# Patient Record
Sex: Male | Born: 1940 | Race: White | Hispanic: No | Marital: Married | Smoking: Current every day smoker
Health system: Southern US, Community
[De-identification: ages and names within clinical notes are randomized; demographics above are authoritative.]

## PROBLEM LIST (undated history)

## (undated) DIAGNOSIS — E119 Type 2 diabetes mellitus without complications: Secondary | ICD-10-CM

## (undated) DIAGNOSIS — N189 Chronic kidney disease, unspecified: Secondary | ICD-10-CM

## (undated) DIAGNOSIS — E785 Hyperlipidemia, unspecified: Secondary | ICD-10-CM

## (undated) DIAGNOSIS — R0600 Dyspnea, unspecified: Secondary | ICD-10-CM

## (undated) DIAGNOSIS — I10 Essential (primary) hypertension: Secondary | ICD-10-CM

## (undated) DIAGNOSIS — Z923 Personal history of irradiation: Secondary | ICD-10-CM

## (undated) DIAGNOSIS — D509 Iron deficiency anemia, unspecified: Secondary | ICD-10-CM

## (undated) DIAGNOSIS — K5791 Diverticulosis of intestine, part unspecified, without perforation or abscess with bleeding: Secondary | ICD-10-CM

## (undated) DIAGNOSIS — Z87442 Personal history of urinary calculi: Secondary | ICD-10-CM

## (undated) DIAGNOSIS — C439 Malignant melanoma of skin, unspecified: Secondary | ICD-10-CM

## (undated) DIAGNOSIS — J42 Unspecified chronic bronchitis: Secondary | ICD-10-CM

## (undated) DIAGNOSIS — K5731 Diverticulosis of large intestine without perforation or abscess with bleeding: Secondary | ICD-10-CM

## (undated) DIAGNOSIS — I2510 Atherosclerotic heart disease of native coronary artery without angina pectoris: Secondary | ICD-10-CM

## (undated) HISTORY — PX: LITHOTRIPSY: SUR834

## (undated) HISTORY — PX: EYE SURGERY: SHX253

## (undated) HISTORY — PX: SKIN CANCER EXCISION: SHX779

## (undated) HISTORY — DX: Diverticulosis of intestine, part unspecified, without perforation or abscess with bleeding: K57.91

## (undated) HISTORY — DX: Iron deficiency anemia, unspecified: D50.9

## (undated) HISTORY — DX: Diverticulosis of large intestine without perforation or abscess with bleeding: K57.31

## (undated) HISTORY — DX: Personal history of irradiation: Z92.3

---

## 2011-12-25 ENCOUNTER — Telehealth: Payer: Self-pay | Admitting: Hematology & Oncology

## 2011-12-25 NOTE — Telephone Encounter (Signed)
Pt aware of 01-14-12 appointment 

## 2012-01-11 ENCOUNTER — Other Ambulatory Visit: Payer: Self-pay | Admitting: Medical

## 2012-01-11 DIAGNOSIS — D45 Polycythemia vera: Secondary | ICD-10-CM

## 2012-01-14 ENCOUNTER — Ambulatory Visit: Payer: Self-pay | Admitting: Hematology & Oncology

## 2012-01-14 ENCOUNTER — Other Ambulatory Visit: Payer: Medicare Other | Admitting: Lab

## 2012-01-14 ENCOUNTER — Ambulatory Visit: Payer: Medicare Other | Admitting: Medical

## 2012-01-14 ENCOUNTER — Ambulatory Visit (HOSPITAL_BASED_OUTPATIENT_CLINIC_OR_DEPARTMENT_OTHER): Payer: Medicare Other | Admitting: Hematology & Oncology

## 2012-01-14 ENCOUNTER — Ambulatory Visit: Payer: Medicare Other

## 2012-01-14 VITALS — BP 139/87 | HR 91 | Temp 96.9°F | Ht 66.0 in | Wt 175.0 lb

## 2012-01-14 DIAGNOSIS — D751 Secondary polycythemia: Secondary | ICD-10-CM

## 2012-01-14 DIAGNOSIS — D45 Polycythemia vera: Secondary | ICD-10-CM

## 2012-01-14 LAB — CBC WITH DIFFERENTIAL (CANCER CENTER ONLY)
BASO#: 0 10*3/uL (ref 0.0–0.2)
Eosinophils Absolute: 0.2 10*3/uL (ref 0.0–0.5)
HGB: 18.2 g/dL — ABNORMAL HIGH (ref 13.0–17.1)
LYMPH#: 3.1 10*3/uL (ref 0.9–3.3)
MONO#: 0.5 10*3/uL (ref 0.1–0.9)
NEUT#: 6 10*3/uL (ref 1.5–6.5)
RBC: 5.75 10*6/uL — ABNORMAL HIGH (ref 4.20–5.70)
WBC: 9.9 10*3/uL (ref 4.0–10.0)

## 2012-01-14 NOTE — Progress Notes (Signed)
This office note has been dictated.

## 2012-01-14 NOTE — Progress Notes (Signed)
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  HISTORY OF PRESENT ILLNESS:  Mr. Reuter is a nice 71 year old gentleman. He is followed by Dr. Martha Clan.  He has a history of diabetes.  He has a history of COPD.  He still smokes 1 pack a day.  He has had blood work done.  He was found back on 12/12/2011 to have a white cell count of 9.8, hemoglobin 18.4, hematocrit 55, platelet count 224.  MCV is 94.  He had electrolytes done, which all looked okay.  His BUN and creatinine were 20 and 1.1.  He had normal TSH.  He is on Plavix.  He may have had a TIA in the past.  He did have a melanoma removed back in December of 2012.  He apparently had this localized.  It did not require any sentinel node biopsy.  Because of the elevated red cell count, he was referred to the [facility] for evaluation.  Dr. Martha Clan felt that polycythemia was a possibility.  Mr. Kobus does not state any type of pruritus after taking a shower.  He has had no kind of bony pain.  He has had no nausea or vomiting.  He denies any kind of dyspnea with exertion.  There has been no cough.  He does have a history of I think kidney stones.  PAST MEDICAL HISTORY: 1. Diabetes. 2. Hypertension. 3. 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 4. TIA. 5. COPD (the patient has never been aware of this). 6. Mild renal insufficiency.  ALLERGIES:  Sulfa drugs.  OUTPATIENT MEDICATIONS: 1. Plavix 75 mg daily. 2. Hydrochlorothiazide 12.5 mg p.o. daily. 3. Pravachol 40 mg p.o. daily. 4. Altace 10 mg p.o. daily. 5. Folic acid 400 mcg p.o. daily.  SOCIAL HISTORY:  Remarkable for probably about a 100 pack year history of tobacco use.  He smokes 1 pack a day now and is trying to cut back. He has no significant alcohol use.  He did have Agent Orange exposure back in [place].  He was in [employer].  He says that he saw [place] the [place].  FAMILY HISTORY:  Remarkable for "brain  cancer", coronary artery disease, nephrolithiasis.  REVIEW OF SYSTEMS:  As stated in history of present illness.  No additional findings noted on 12 system review.  PHYSICAL EXAMINATION:  General:  This is a well-developed, well- nourished white gentleman in no obvious distress.  He does have some facial plethora.  Vital signs:  His vital signs show a temperature of 96.8, pulse 91, respiratory rate 20, blood pressure 139/87.  Weight is 175.  Head and neck:  Normocephalic, atraumatic skull.  There are no ocular or oral lesions.  He has no conjunctival inflammation.  Pupils react appropriately.  He has no intraoral lesions.  No adenopathy is noted in the neck.  Thyroid is not palpable.  Lungs:  Clear to percussion and auscultation bilaterally.  He has no rales, wheezes or rhonchi.  Cardiac:  Regular rate and rhythm with a normal S1 and S2. There are no murmurs, rubs or bruits.  Abdomen:  Soft with good bowel sounds.  He has no fluid wave.  There is no guarding or rebound tenderness.  There is no palpable hepatosplenomegaly.  Back:  Shows no tenderness over the spine, ribs or hips.  Extremities:  Show no clubbing, cyanosis or edema.  Skin:  Exam does show some degree of ruddy complexion.  Neurological:  Shows  no focal neurological findings.  LABORATORY STUDIES:  White cell count 9.9, hemoglobin 18.2, hematocrit 32, platelet count 242.  MCV is 90.  Peripheral smear shows a normochromic, normocytic population of red blood cells.  He has no immature red cells.  There are no nucleated red cells.  I see no rouleaux formation.  He has no teardrop cells.  I see no schistocytes. White cells appear normal in morphology and maturation.  I see no hypersegmented polys.  I see no immature myeloid or lymphoid forms. Platelets are adequate in number and size.  LDH is 112.  Oxygen saturation on room air is 98%.  IMPRESSION:  Mr. Collington is a 71 year old gentleman with erythrocytosis.  I think it is  debatable whether he does have polycythemia.  I must say that he does have quite a reddish complexion to his skin.  We are going to have to await the results of his JAK2 assay, erythropoietin assay and his iron studies.  I think we also sent off a B12 level on him.  Given that he does have high blood pressure and he is somewhat overweight, he may have [place]'s polycythemia or spurious polycythemia.  I checked out his oxygen saturation.  He is really not hypoxic.  I cannot feel an enlarged spleen on him.  I do not suspect that we have to do a bone marrow test on him right now.  Again, we will have to await the results of his lab work and then plan accordingly.  The fact that he had this TIA certainly is a little troublesome.  This could be an indicator of some hyperviscosity.  In fact, if that is the case then he certainly would benefit from phlebotomy to keep his hematocrit below 45%.  Once we get his lab results back, we will be in touch with Mr. Moulder then we will plan accordingly for a phlebotomy program if necessary.  I spent a good hour or so with him and his wife.  They are both very, very nice.  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  He certainly is a true [employer] hero.  The fact that he was in [place] with Agent Orange exposure does increase his risk for blood issues.    ______________________________ Josph Macho, M.D. PRE/MEDQ  D:  01/14/2012  T:  01/14/2012  Job:  [id number]

## 2012-01-15 ENCOUNTER — Other Ambulatory Visit: Payer: Self-pay | Admitting: Medical

## 2012-01-15 LAB — LACTATE DEHYDROGENASE: LDH: 112 U/L (ref 94–250)

## 2012-01-15 LAB — FERRITIN: Ferritin: 39 ng/mL (ref 22–322)

## 2012-01-15 LAB — ERYTHROPOIETIN: Erythropoietin: 11.9 m[IU]/mL (ref 2.6–34.0)

## 2012-01-25 ENCOUNTER — Other Ambulatory Visit: Payer: Self-pay

## 2012-02-07 ENCOUNTER — Other Ambulatory Visit: Payer: Self-pay | Admitting: Hematology & Oncology

## 2012-02-07 DIAGNOSIS — D45 Polycythemia vera: Secondary | ICD-10-CM

## 2012-02-07 DIAGNOSIS — D751 Secondary polycythemia: Secondary | ICD-10-CM

## 2012-02-13 ENCOUNTER — Ambulatory Visit (HOSPITAL_BASED_OUTPATIENT_CLINIC_OR_DEPARTMENT_OTHER): Payer: Medicare Other

## 2012-02-13 VITALS — BP 121/82 | HR 81 | Temp 96.9°F

## 2012-02-13 DIAGNOSIS — D45 Polycythemia vera: Secondary | ICD-10-CM

## 2012-02-13 NOTE — Patient Instructions (Signed)

## 2012-02-13 NOTE — Progress Notes (Signed)
Robert Tran presents today for phlebotomy per MD orders. Phlebotomy procedure started at 1415and ended at 1435. 500 ml removed. Patient observed for 30 minutes after procedure without any incident. Patient tolerated procedure well. IV needle removed intact.

## 2012-02-20 ENCOUNTER — Ambulatory Visit (HOSPITAL_BASED_OUTPATIENT_CLINIC_OR_DEPARTMENT_OTHER): Payer: Medicare Other

## 2012-02-20 DIAGNOSIS — D45 Polycythemia vera: Secondary | ICD-10-CM

## 2012-02-20 NOTE — Progress Notes (Signed)
Robert Tran presents today for phlebotomy per MD orders. Phlebotomy procedure started at 1440 and ended at 1448. Approximately removed. Patient observed for 30 minutes after procedure without any incident. Patient tolerated procedure well. IV needle removed intact. Teola Bradley, Yassmine Tamm [employer]

## 2012-02-27 ENCOUNTER — Ambulatory Visit (HOSPITAL_BASED_OUTPATIENT_CLINIC_OR_DEPARTMENT_OTHER): Payer: Medicare Other | Admitting: Hematology & Oncology

## 2012-02-27 ENCOUNTER — Ambulatory Visit (HOSPITAL_BASED_OUTPATIENT_CLINIC_OR_DEPARTMENT_OTHER): Payer: Medicare Other

## 2012-02-27 ENCOUNTER — Other Ambulatory Visit (HOSPITAL_BASED_OUTPATIENT_CLINIC_OR_DEPARTMENT_OTHER): Payer: Medicare Other | Admitting: Lab

## 2012-02-27 VITALS — BP 110/72 | HR 72 | Temp 97.0°F | Resp 20 | Ht 66.0 in | Wt 172.0 lb

## 2012-02-27 DIAGNOSIS — D45 Polycythemia vera: Secondary | ICD-10-CM

## 2012-02-27 DIAGNOSIS — D751 Secondary polycythemia: Secondary | ICD-10-CM

## 2012-02-27 DIAGNOSIS — F17200 Nicotine dependence, unspecified, uncomplicated: Secondary | ICD-10-CM

## 2012-02-27 DIAGNOSIS — J449 Chronic obstructive pulmonary disease, unspecified: Secondary | ICD-10-CM

## 2012-02-27 DIAGNOSIS — E119 Type 2 diabetes mellitus without complications: Secondary | ICD-10-CM

## 2012-02-27 LAB — CBC WITH DIFFERENTIAL (CANCER CENTER ONLY)
BASO#: 0.1 10*3/uL (ref 0.0–0.2)
EOS%: 2 % (ref 0.0–7.0)
Eosinophils Absolute: 0.2 10*3/uL (ref 0.0–0.5)
HGB: 16.3 g/dL (ref 13.0–17.1)
LYMPH%: 19.1 % (ref 14.0–48.0)
MCH: 32.1 pg (ref 28.0–33.4)
MCHC: 34.9 g/dL (ref 32.0–35.9)
MCV: 92 fL (ref 82–98)
MONO%: 4.4 % (ref 0.0–13.0)
RBC: 5.07 10*6/uL (ref 4.20–5.70)

## 2012-02-27 NOTE — Progress Notes (Signed)
Robert Tran presents today for phlebotomy per MD orders. Phlebotomy procedure started at 1130 and ended at 1145.  500cc removed. Patient tolerated procedure well. IV needle removed intact.

## 2012-02-27 NOTE — Progress Notes (Signed)
This office note has been dictated.

## 2012-02-27 NOTE — Patient Instructions (Signed)
Therapeutic Phlebotomy Therapeutic phlebotomy is the controlled removal of blood from your body for the purpose of treating a medical condition. It is similar to donating blood. Usually, about a pint (470 mL) of blood is removed. The average adult has 9 to 12 pints (4.3 to 5.7 L) of blood. Therapeutic phlebotomy may be used to treat the following medical conditions:  Hemochromatosis. This is a condition in which there is too much iron in the blood.   Polycythemia vera. This is a condition in which there are too many red cells in the blood.   Porphyria cutanea tarda. This is a disease usually passed from one generation to the next (inherited). It is a condition in which an important part of hemoglobin is not made properly. This results in the build up of abnormal amounts of porphyrins in the body.   Sickle cell disease. This is an inherited disease. It is a condition in which the red blood cells form an abnormal crescent shape rather than a round shape.  LET YOUR CAREGIVER KNOW ABOUT:  Allergies.   Medicines taken including herbs, eyedrops, over-the-counter medicines, and creams.   Use of steroids (by mouth or creams).   Previous problems with anesthetics or numbing medicine.   History of blood clots.   History of bleeding or blood problems.   Previous surgery.   Possibility of pregnancy, if this applies.  RISKS AND COMPLICATIONS This is a simple and safe procedure. Problems are unlikely. However, problems can occur and may include:  Nausea or lightheadedness.   Low blood pressure.   Soreness, bleeding, swelling, or bruising at the needle insertion site.   Infection.  BEFORE THE PROCEDURE  This is a procedure that can be done as an outpatient. Confirm the time that you need to arrive for your procedure. Confirm whether there is a need to fast or withhold any medications. It is helpful to wear clothing with sleeves that can be raised above the elbow. A blood sample may be done  to determine the amount of red blood cells or iron in your blood. Plan ahead of time to have someone drive you home after the procedure. PROCEDURE The entire procedure from preparation through recovery takes about 1 hour. The actual collection takes about 10 to 15 minutes.  A needle will be inserted into your vein.   Tubing and a collection bag will be attached to that needle.   Blood will flow through the needle and tubing into the collection bag.   You may be asked to open and close your hand slowly and continuously during the entire collection.   Once the specified amount of blood has been removed from your body, the collection bag and tubing will be clamped.   The needle will be removed.   Pressure will be held on the site of the needle insertion to stop the bleeding. Then a bandage will be placed over the needle insertion site.  AFTER THE PROCEDURE  Your recovery will be assessed and monitored. If there are no problems, as an outpatient, you should be able to go home shortly after the procedure.  Document Released: 12/04/2010 Document Revised: 06/21/2011 Document Reviewed: 12/04/2010 [employer] Patient Information 2012 [employer]. 

## 2012-02-28 NOTE — Progress Notes (Signed)
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  CURRENT THERAPY:  Phlebotomy to maintain hematocrit below 45%.  INTERIM HISTORY:  Robert Tran comes in for his followup.  He is feeling better.  We did do a fairly extensive evaluation on him when we initially saw him.  When we first saw him back in early July, his ferritin was 39.  His erythropoietin level was only 12.  LDH was normal. We did do a JAK2 assay on him.  JAK2 was negative.  However, I felt that he, in all likelihood, had polycythemia.  He does smoke, which does not help the issue.  He is on Plavix.  He had a TIA in the past.  We have him on a phlebotomy program.  He has doing well with phlebotomies.  He says he actually feels better.  He does have diabetes. There is a history of COPD.  PHYSICAL EXAMINATION:  This is a well-developed, well-nourished white gentleman in no obvious distress.  Vital signs:  97, pulse 72, respiratory rate 18, blood pressure 110/72.  Weight is 172.  Head and neck:  Normocephalic, atraumatic skull.  There are no ocular or oral lesions.  There are no palpable cervical or supraclavicular lymph nodes. He does have some facial plethora.  There is no adenopathy in his neck. Lungs:  Clear bilaterally.  Cardiac:  Regular rate and rhythm with a normal S1 and S2.  There are no murmurs, rubs or bruits.  Abdomen:  Soft with good bowel sounds.  There is no palpable abdominal mass.  There is no fluid wave.  There is no palpable hepatosplenomegaly.  Back:  No tenderness over the spine, ribs, or hips.  Extremities:  No clubbing, cyanosis or edema.  LABORATORY STUDIES:  White cell count is 11, hemoglobin 16.3, hematocrit 46.7, platelet count 240.  IMPRESSION:  Mr. Delay is a 71 year old gentleman with polycythemia.  I suppose that he still may have secondary polycythemia.  He does have the chronic obstructive pulmonary disease with tobacco use.  He is still smoking.  However, he is  doing better and feeling better clinically with phlebotomies.  As such, I believe that we should keep him phlebotomized and keep his hematocrit below 45.  We will phlebotomize him today.  We will then see about getting him back in 1 month and seeing how his hematocrit goes.  We will follow his iron levels and ferritin.  This will give me a good idea as to him becoming iron-deficient.    ______________________________ Josph Macho, M.D. PRE/MEDQ  D:  02/27/2012  T:  02/28/2012  Job:  [id number]

## 2012-03-28 ENCOUNTER — Other Ambulatory Visit (HOSPITAL_BASED_OUTPATIENT_CLINIC_OR_DEPARTMENT_OTHER): Payer: Medicare Other | Admitting: Lab

## 2012-03-28 ENCOUNTER — Ambulatory Visit (HOSPITAL_BASED_OUTPATIENT_CLINIC_OR_DEPARTMENT_OTHER): Payer: Medicare Other

## 2012-03-28 ENCOUNTER — Ambulatory Visit (HOSPITAL_BASED_OUTPATIENT_CLINIC_OR_DEPARTMENT_OTHER): Payer: Medicare Other | Admitting: Hematology & Oncology

## 2012-03-28 VITALS — BP 118/69 | HR 86 | Temp 98.4°F | Resp 20 | Ht 67.0 in | Wt 171.0 lb

## 2012-03-28 VITALS — BP 146/80 | HR 86

## 2012-03-28 DIAGNOSIS — J449 Chronic obstructive pulmonary disease, unspecified: Secondary | ICD-10-CM

## 2012-03-28 DIAGNOSIS — D45 Polycythemia vera: Secondary | ICD-10-CM | POA: Insufficient documentation

## 2012-03-28 DIAGNOSIS — E119 Type 2 diabetes mellitus without complications: Secondary | ICD-10-CM

## 2012-03-28 LAB — CBC WITH DIFFERENTIAL (CANCER CENTER ONLY)
BASO%: 0.5 % (ref 0.0–2.0)
EOS%: 2 % (ref 0.0–7.0)
LYMPH%: 24.2 % (ref 14.0–48.0)
MCH: 31.6 pg (ref 28.0–33.4)
MCHC: 34.2 g/dL (ref 32.0–35.9)
MCV: 92 fL (ref 82–98)
MONO%: 6.2 % (ref 0.0–13.0)
NEUT#: 6.2 10*3/uL (ref 1.5–6.5)
Platelets: 243 10*3/uL (ref 145–400)
RDW: 13.6 % (ref 11.1–15.7)

## 2012-03-28 LAB — IRON AND TIBC: %SAT: 25 % (ref 20–55)

## 2012-03-28 LAB — FERRITIN: Ferritin: 13 ng/mL — ABNORMAL LOW (ref 22–322)

## 2012-03-28 NOTE — Progress Notes (Signed)
CC:   Modesto Charon  DIAGNOSIS:  Polycythemia vera, JAK2 negative.  CURRENT THERAPY:  Phlebotomy to maintain hematocrit less than 45%.  INTERIM HISTORY:  Mr. Simoes comes in for his followup.  He is looking pretty good.  His face is quite red again today.  Apparently he and his wife are getting ready to go back to the beach.  They will be doing some painting on their house down there.  When we last checked his ferritin it was 39 back in July.  We will check into this again today.  He is phlebotomized pretty much once a month.  He is trying to cut back on smoking.  This I think will also help keep his blood down.  He has had no pruritus.  He has had no leg swelling.  He has had no change in bowel or bladder habits.  He has had no headache.  He did have some problems with one of his cholesterol medicines.  He said he could not move and was very stiff.  As such, he stopped this.  PHYSICAL EXAMINATION:  General:  This is a well-developed, well- nourished white gentleman in no obvious distress.  Vital signs:  98.4, pulse 86, respiratory rate 16, blood pressure 118/69.  Weight is 171. Head and neck:  Normocephalic, atraumatic skull.  There are no ocular or oral lesions.  There are no palpable cervical, supraclavicular lymph nodes.  Lungs:  Clear bilaterally.  Cardiac:  Regular rate and rhythm with normal S1 and S2.  There are no murmurs, rubs or bruits.  Abdomen: Soft with good bowel sounds.  There is no palpable abdominal mass.  No palpable hepatosplenomegaly.  Extremities:  Shows no clubbing, cyanosis or edema.  Neurological:  Shows no focal neurological deficits.  LABORATORY STUDIES:  White cell count is 9.2, hemoglobin 16.2, hematocrit 47.3, platelet count 243.  IMPRESSION:  Mr. Nuzum is a 71 year old gentleman with polycythemia. Some of erythrocytosis is from his smoking.  Again, I encouraged him to cut back on his tobacco use.  I also encouraged him to use an  electronic cigarette.  We will go ahead and phlebotomize him again today.  We will then plan to get him back in another month.  Again, I really want make sure that we keep his hematocrit below 45%.    ______________________________ Josph Macho, M.D. PRE/MEDQ  D:  03/28/2012  T:  03/28/2012  Job:  [id number]

## 2012-03-28 NOTE — Progress Notes (Signed)
This office note has been dictated.

## 2012-03-28 NOTE — Patient Instructions (Signed)
Call if problems 

## 2012-03-28 NOTE — Progress Notes (Signed)
Robert Tran presents today for phlebotomy per MD orders. Phlebotomy procedure started at 1000 and ended at 1015 removed. Approximately 500 mls removed. Patient observed for 30 minutes after procedure without any incident. Patient tolerated procedure well. IV needle removed intact. Teola Bradley, Karinda Cabriales [employer]

## 2012-03-28 NOTE — Patient Instructions (Signed)

## 2012-04-24 ENCOUNTER — Ambulatory Visit (HOSPITAL_BASED_OUTPATIENT_CLINIC_OR_DEPARTMENT_OTHER): Payer: Medicare Other | Admitting: Hematology & Oncology

## 2012-04-24 ENCOUNTER — Other Ambulatory Visit (HOSPITAL_BASED_OUTPATIENT_CLINIC_OR_DEPARTMENT_OTHER): Payer: Medicare Other | Admitting: Lab

## 2012-04-24 ENCOUNTER — Ambulatory Visit (HOSPITAL_BASED_OUTPATIENT_CLINIC_OR_DEPARTMENT_OTHER): Payer: Medicare Other

## 2012-04-24 VITALS — BP 130/83 | HR 92 | Resp 20

## 2012-04-24 VITALS — BP 123/67 | HR 86 | Temp 97.9°F | Resp 18 | Ht 67.0 in | Wt 170.0 lb

## 2012-04-24 DIAGNOSIS — D45 Polycythemia vera: Secondary | ICD-10-CM

## 2012-04-24 LAB — CBC WITH DIFFERENTIAL (CANCER CENTER ONLY)
BASO%: 0.7 % (ref 0.0–2.0)
EOS%: 3.2 % (ref 0.0–7.0)
HCT: 47.3 % (ref 38.7–49.9)
LYMPH#: 3.7 10*3/uL — ABNORMAL HIGH (ref 0.9–3.3)
MCHC: 34.2 g/dL (ref 32.0–35.9)
NEUT%: 52.9 % (ref 40.0–80.0)
Platelets: 276 10*3/uL (ref 145–400)
RDW: 13.1 % (ref 11.1–15.7)
WBC: 10.3 10*3/uL — ABNORMAL HIGH (ref 4.0–10.0)

## 2012-04-24 NOTE — Progress Notes (Signed)
This office note has been dictated.

## 2012-04-24 NOTE — Progress Notes (Signed)
Robert Tran presents today for phlebotomy per MD orders. Phlebotomy procedure started at 1435 and ended at 1455. 500 grams removed. Patient observed for 30 minutes after procedure without any incident. Patient tolerated procedure well. IV needle removed intact.

## 2012-04-24 NOTE — Patient Instructions (Signed)
Call if needed

## 2012-04-25 NOTE — Progress Notes (Signed)
CC:   Robert Organ, MD  DIAGNOSIS:  Polycythemia vera, JAK2 negative.  CURRENT THERAPY:  Phlebotomy to maintain hematocrit less than 45%.  INTERIM HISTORY:  Robert Tran comes in for followup.  He is doing okay.  He has cut back on his tobacco use.  He is also trying to cut back on his coffee consumption.  He was drinking 10 cups a day.  I told him that all that caffeine could easily cause his  blood to go up as caffeine is a vasoconstrictor.  Iron has not been a problem for him.  When we last checked his ferritin, it was down to 13.  He has had no headache.  He has had no chest pain.  He has had no problem with bowels or bladder.  He is on Plavix and doing well with this.  PHYSICAL EXAMINATION:  This is a well-developed, well-nourished white gentleman in no obvious distress.  Vital signs:  Temperature of 97.9, pulse 86, respiratory rate 18, blood pressure 123/67.  Weight is 170. Head and neck:  Normocephalic, atraumatic skull.  There are no ocular or oral lesions.  There are no palpable cervical or supraclavicular lymph nodes.  Lungs:  Clear bilaterally.  Cardiac:  Regular rate and rhythm with a normal S1, S2.  There are no murmurs, rubs or bruits.  Abdomen: Soft with good bowel sounds.  There is no palpable abdominal mass. There is no fluid wave.  There is no palpable hepatosplenomegaly.  Back: No tenderness over the spine, ribs, or hips.  Extremities:  No clubbing, cyanosis or edema.  Skin:  Shows some facial plethora.  He has a little bit of a "ruddy" complexion to his skin.  Neurological:  No focal neurological deficits.  LABORATORY STUDIES:  White cell count is 10.3, hemoglobin 16.2, hematocrit 47.3, platelet count 276.  IMPRESSION:  Robert Tran is a 71 year old gentleman with polycythemia vera. We have him on a phlebotomy program.  He is doing okay with this.  We will phlebotomize him today.  We will then plan to get him back in about, I think, 6 weeks.  I think we can go a  few extra weeks.  Again, he is doing his best to cut back on tobacco use and also caffeine consumption.   ______________________________ Josph Macho, M.D. PRE/MEDQ  D:  04/24/2012  T:  04/25/2012  Job:  [id number]

## 2012-04-27 LAB — IRON AND TIBC
Iron: 90 ug/dL (ref 42–165)
TIBC: 413 ug/dL (ref 215–435)

## 2012-06-19 ENCOUNTER — Other Ambulatory Visit (HOSPITAL_BASED_OUTPATIENT_CLINIC_OR_DEPARTMENT_OTHER): Payer: Medicare Other | Admitting: Lab

## 2012-06-19 ENCOUNTER — Ambulatory Visit (HOSPITAL_BASED_OUTPATIENT_CLINIC_OR_DEPARTMENT_OTHER): Payer: Medicare Other | Admitting: Hematology & Oncology

## 2012-06-19 ENCOUNTER — Ambulatory Visit (HOSPITAL_BASED_OUTPATIENT_CLINIC_OR_DEPARTMENT_OTHER): Payer: Medicare Other

## 2012-06-19 VITALS — BP 144/75 | HR 85 | Temp 97.9°F | Resp 18 | Ht 66.0 in | Wt 173.0 lb

## 2012-06-19 VITALS — BP 157/83 | HR 89

## 2012-06-19 DIAGNOSIS — D45 Polycythemia vera: Secondary | ICD-10-CM

## 2012-06-19 DIAGNOSIS — D509 Iron deficiency anemia, unspecified: Secondary | ICD-10-CM

## 2012-06-19 LAB — CBC WITH DIFFERENTIAL (CANCER CENTER ONLY)
BASO%: 0.6 % (ref 0.0–2.0)
EOS%: 3.3 % (ref 0.0–7.0)
LYMPH#: 4.1 10*3/uL — ABNORMAL HIGH (ref 0.9–3.3)
LYMPH%: 39.5 % (ref 14.0–48.0)
MCHC: 33.4 g/dL (ref 32.0–35.9)
MCV: 88 fL (ref 82–98)
MONO#: 0.9 10*3/uL (ref 0.1–0.9)
NEUT%: 47.9 % (ref 40.0–80.0)
Platelets: 259 10*3/uL (ref 145–400)
RDW: 13.9 % (ref 11.1–15.7)
WBC: 10.4 10*3/uL — ABNORMAL HIGH (ref 4.0–10.0)

## 2012-06-19 LAB — CHCC SATELLITE - SMEAR

## 2012-06-19 NOTE — Patient Instructions (Signed)

## 2012-06-19 NOTE — Progress Notes (Signed)
Robert Tran presents today for phlebotomy per MD orders. Phlebotomy procedure started at 1456 and ended at 1300. Approximately 500 mls removed. Patient observed for 30 minutes after procedure without any incident. Patient tolerated procedure well. IV needle removed intact. Teola Bradley, Brindle Leyba [employer]

## 2012-06-19 NOTE — Progress Notes (Signed)
This office note has been dictated.

## 2012-06-20 NOTE — Progress Notes (Signed)
CC:   Robert Tran  DIAGNOSIS:  Polycythemia vera, JAK2 negative.  CURRENT THERAPY: 1. Phlebotomy to maintain hematocrit less than 45%. 2. Plavix 75 mg p.o. daily.  INTERIM HISTORY:  Robert Tran comes in for his followup.  He is doing well. He is still drinking quite a bit of coffee.  He is still smoking but cutting back on this also.  He and his wife have spent a lot of time down at the beach.  They have a house down there.  His wife however now is working for [employer]  Robert Tran had last ferritin in October of 8.  He was last phlebotomized in October.  He has had no headache.  There is no burning in hands or feet.  He has had no change in bowel or bladder habits.  PHYSICAL EXAMINATION:  General:  This is a well-developed, well- nourished white gentleman in no obvious distress.  Vital signs:  97.9, pulse 85, respiratory rate 18, blood pressure 144/75.  Weight is 173. Head and neck:  Shows a normocephalic, atraumatic skull.  There are no ocular or oral lesions.  There are no palpable cervical or supraclavicular lymph nodes.  Lungs:  Clear bilaterally.  There are no rales, wheeze or rhonchi.  Cardiac:  Regular rate and rhythm with normal S1, S2.  There are no murmurs, rubs or bruits.  Abdomen:  Soft with good bowel sounds.  There is no palpable abdominal mass.  There is no fluid wave.  There is no palpable hepatosplenomegaly.  Extremities:  Show no clubbing, cyanosis or edema.  Neurological:  Shows no focal neurological deficits.  LABORATORY STUDIES:  White cell count is 10.4, hemoglobin 15, hematocrit 44.9, platelet count 259.  MCV is 88.  IMPRESSION:  Robert Tran is a 71 year old gentleman with polycythemia vera. We will phlebotomize him today.  He is right at 45.  I feel that phlebotomizing him will get him through the holidays and I will feel better making sure that his blood is kept below 45%.  We will plan to get him back in 2 more months.  He clearly is iron  deficient so hopefully will continue to get iron out of him to decrease his bone marrow's hematopoietic abilities.    ______________________________ Josph Macho, M.D. PRE/MEDQ  D:  06/19/2012  T:  06/20/2012  Job:  [id number]

## 2012-08-20 ENCOUNTER — Ambulatory Visit: Payer: Medicare Other

## 2012-08-20 ENCOUNTER — Other Ambulatory Visit (HOSPITAL_BASED_OUTPATIENT_CLINIC_OR_DEPARTMENT_OTHER): Payer: Medicare Other | Admitting: Lab

## 2012-08-20 ENCOUNTER — Ambulatory Visit (HOSPITAL_BASED_OUTPATIENT_CLINIC_OR_DEPARTMENT_OTHER): Payer: Medicare Other | Admitting: Hematology & Oncology

## 2012-08-20 VITALS — BP 122/63 | HR 84 | Temp 98.6°F | Resp 18 | Ht 66.0 in | Wt 173.0 lb

## 2012-08-20 DIAGNOSIS — D45 Polycythemia vera: Secondary | ICD-10-CM

## 2012-08-20 DIAGNOSIS — F17200 Nicotine dependence, unspecified, uncomplicated: Secondary | ICD-10-CM

## 2012-08-20 LAB — IRON AND TIBC
TIBC: 397 ug/dL (ref 215–435)
UIBC: 333 ug/dL (ref 125–400)

## 2012-08-20 LAB — CBC WITH DIFFERENTIAL (CANCER CENTER ONLY)
BASO%: 0.8 % (ref 0.0–2.0)
EOS%: 3.4 % (ref 0.0–7.0)
LYMPH#: 3 10*3/uL (ref 0.9–3.3)
MCHC: 32.8 g/dL (ref 32.0–35.9)
MONO#: 0.7 10*3/uL (ref 0.1–0.9)
Platelets: 265 10*3/uL (ref 145–400)
RDW: 15.6 % (ref 11.1–15.7)
WBC: 9.1 10*3/uL (ref 4.0–10.0)

## 2012-08-20 LAB — FERRITIN: Ferritin: 8 ng/mL — ABNORMAL LOW (ref 22–322)

## 2012-08-20 NOTE — Progress Notes (Signed)
No phlebotomy today per dr. ennever 

## 2012-08-20 NOTE — Progress Notes (Signed)
This office note has been dictated.

## 2012-08-21 NOTE — Progress Notes (Signed)
CC:   Modesto Charon  DIAGNOSIS:  Polycythemia vera-JAK2 negative.  CURRENT THERAPY: 1. Phlebotomy to maintain hematocrit less than 45%. 2. Plavix 75 mg p.o. daily.  INTERIM HISTORY:  Mr. Rothe comes in for his followup.  We last saw him back in early December.  He has been doing well.  He says he is down to smoking 1 pack a day now.  He is trying to cut back on his coffee use also.  I told him that the less that he smokes, the better that his blood counts will be and the less he will need to be phlebotomized.  His last ferritin back in December was down to 6.  He has had no bleeding.  He has had no cough.  He has had no headache. There has been no TIA-like symptoms.  He has had no leg swelling.  PHYSICAL EXAMINATION:  General:  This is a well-developed, well- nourished white gentleman in no obvious distress.  Vital signs: Temperature 98.6, pulse 84, respiratory rate 18, blood pressure 122/63. Weight is 173.  Head and neck:  Normocephalic, atraumatic skull.  There are no ocular or oral lesions.  There are no palpable cervical or supraclavicular lymph nodes.  Lungs:  Clear bilaterally.  Cardiac: Regular rate and rhythm with a normal S1 and S2.  There are no murmurs, rubs, or bruits.  Abdomen:  Soft with good bowel sounds.  There is no palpable abdominal mass.  There is no fluid wave.  There is no palpable hepatosplenomegaly.  Back:  No tenderness over the spine, ribs, or hips. Extremities:  No clubbing, cyanosis, or edema.  Neurological:  No focal neurological deficits.  LABORATORY STUDIES:  White cell count is 9.1, hemoglobin 14.6, hematocrit 44.5, platelet count 265.  IMPRESSION:  Mr. Pardo is a 72 year old gentleman with polycythemia vera. Again, he is doing quite well.  We will not need to phlebotomize him right now.  The less he smokes, the better his blood counts will be.  We will plan to get him back in another 2 months for  followup.   ______________________________ Josph Macho, M.D. PRE/MEDQ  D:  08/20/2012  T:  08/21/2012  Job:  [id number]

## 2012-10-15 ENCOUNTER — Ambulatory Visit (HOSPITAL_BASED_OUTPATIENT_CLINIC_OR_DEPARTMENT_OTHER): Payer: Medicare Other

## 2012-10-15 ENCOUNTER — Other Ambulatory Visit (HOSPITAL_BASED_OUTPATIENT_CLINIC_OR_DEPARTMENT_OTHER): Payer: Medicare Other | Admitting: Lab

## 2012-10-15 ENCOUNTER — Ambulatory Visit (HOSPITAL_BASED_OUTPATIENT_CLINIC_OR_DEPARTMENT_OTHER): Payer: Medicare Other | Admitting: Medical

## 2012-10-15 VITALS — BP 136/76 | HR 83

## 2012-10-15 VITALS — BP 131/63 | HR 87 | Temp 97.6°F | Resp 18 | Ht 65.0 in | Wt 172.0 lb

## 2012-10-15 DIAGNOSIS — D45 Polycythemia vera: Secondary | ICD-10-CM

## 2012-10-15 LAB — CBC WITH DIFFERENTIAL (CANCER CENTER ONLY)
BASO#: 0.1 10*3/uL (ref 0.0–0.2)
EOS%: 2.2 % (ref 0.0–7.0)
Eosinophils Absolute: 0.2 10*3/uL (ref 0.0–0.5)
HGB: 15.9 g/dL (ref 13.0–17.1)
LYMPH%: 31.3 % (ref 14.0–48.0)
MCH: 29.1 pg (ref 28.0–33.4)
MCHC: 34 g/dL (ref 32.0–35.9)
MCV: 86 fL (ref 82–98)
MONO%: 6.9 % (ref 0.0–13.0)
NEUT#: 5.4 10*3/uL (ref 1.5–6.5)
Platelets: 253 10*3/uL (ref 145–400)
RBC: 5.47 10*6/uL (ref 4.20–5.70)

## 2012-10-15 NOTE — Patient Instructions (Addendum)

## 2012-10-15 NOTE — Progress Notes (Signed)
Robert Tran presents today for phlebotomy per MD orders. Phlebotomy procedure started at 1220 and ended at 1230. 500 grams removed. Patient observed for 30 minutes after procedure without any incident. Patient tolerated procedure well. IV needle removed intact.

## 2012-10-15 NOTE — Progress Notes (Signed)
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  CURRENT THERAPY: 1. Phlebotomy to maintain hematocrit less than 45%. 2. Plavix 75 mg p.o. daily.  INTERIM HISTORY:  Robert Tran presents today for an office followup visit.  Overall, Robert Tran, reports she's doing relatively well.  We do not phlebotomize him.  The last, time, we saw him.  Robert Tran will require a phlebotomy today.  Robert Tran still continues to smoke.  His last ferritin back in February was 8.  Robert Tran has not had any bleeding.  Robert Tran's had no, cough.  Robert Tran's had no headache.  There has been no TIA-like symptoms.  Robert Tran, reports, a good appetite.  Robert Tran denies any nausea, vomiting, diarrhea, constipation, chest pain, shortness of breath, or cough.  Robert Tran denies any fevers, chills, or night sweats.  Robert Tran denies any obvious, or abnormal bleeding.  Robert Tran denies any lower leg swelling.  Robert Tran denies any headaches, visual changes, or rashes.  Review of Systems: Constitutional:Negative for malaise/fatigue, fever, chills, weight loss, diaphoresis, activity change, appetite change, and unexpected weight change.  HEENT: Negative for double vision, blurred vision, visual loss, ear pain, tinnitus, congestion, rhinorrhea, epistaxis sore throat or sinus disease, oral pain/lesion, tongue soreness Respiratory: Negative for cough, chest tightness, shortness of breath, wheezing and stridor.  Cardiovascular: Negative for chest pain, palpitations, leg swelling, orthopnea, PND, DOE or claudication Gastrointestinal: Negative for nausea, vomiting, abdominal pain, diarrhea, constipation, blood in stool, melena, hematochezia, abdominal distention, anal bleeding, rectal pain, anorexia and hematemesis.  Genitourinary: Negative for dysuria, frequency, hematuria,  Musculoskeletal: Negative for myalgias, back pain, joint swelling, arthralgias and gait problem.  Skin: Negative for rash, color change, pallor and wound.  Neurological:. Negative for dizziness/light-headedness, tremors, seizures, syncope, facial  asymmetry, speech difficulty, weakness, numbness, headaches and paresthesias.  Hematological: Negative for adenopathy. Does not bruise/bleed easily.  Psychiatric/Behavioral:  Negative for depression, no loss of interest in normal activity or change in sleep pattern.   Physical Exam: This is a 72 year old, well-developed, well-nourished, white gentleman, in no obvious distress Vitals: Temperature 97.6 degrees, pulse 87, respirations 18, blood pressure 131/63, weight 172 pounds HEENT reveals a normocephalic, atraumatic skull, no scleral icterus, no oral lesions  Neck is supple without any cervical or supraclavicular adenopathy.  Lungs are clear to auscultation bilaterally. There are no wheezes, rales or rhonci Cardiac is regular rate and rhythm with a normal S1 and S2. There are no murmurs, rubs, or bruits.  Abdomen is soft with good bowel sounds, there is no palpable mass. There is no palpable hepatosplenomegaly. There is no palpable fluid wave.  Musculoskeletal no tenderness of the spine, ribs, or hips.  Extremities there are no clubbing, cyanosis, or edema.  Skin no petechia, purpura or ecchymosis Neurologic is nonfocal.  Laboratory Data: White count 9.1, hemoglobin 15.9, hematocrit 46.8, platelets 253,000   Current Outpatient Prescriptions on File Prior to Visit  Medication Sig Dispense Refill  . cholecalciferol (VITAMIN D) 1000 UNITS tablet Take 1,000 Units by mouth daily.      . clopidogrel (PLAVIX) 75 MG tablet Take 75 mg by mouth daily.      . Folic Acid 200 MCG TABS Take 400 mcg by mouth every morning.      . loxapine (LOXITANE) 10 MG capsule Take 10 mg by mouth daily.      . ramipril (ALTACE) 10 MG tablet Take 10 mg by mouth daily.       No current facility-administered medications on file prior to visit.   Assessment/Plan: This is a pleasant, 72 year old, white gentleman, with the  following issues:  #1.  Polycythemia vera.  Again, Robert Tran is JAK2 negative.  His hematocrit is above  45%, today, as, such, we will go ahead and phlebotomize him.  #2.  Followup.  We will follow back up with Robert Tran in 2 months, but before then should there be questions or concerns.

## 2012-12-11 ENCOUNTER — Other Ambulatory Visit (HOSPITAL_BASED_OUTPATIENT_CLINIC_OR_DEPARTMENT_OTHER): Payer: Medicare Other | Admitting: Lab

## 2012-12-11 ENCOUNTER — Other Ambulatory Visit: Payer: Self-pay

## 2012-12-11 ENCOUNTER — Ambulatory Visit (HOSPITAL_BASED_OUTPATIENT_CLINIC_OR_DEPARTMENT_OTHER): Payer: Medicare Other

## 2012-12-11 ENCOUNTER — Ambulatory Visit (HOSPITAL_BASED_OUTPATIENT_CLINIC_OR_DEPARTMENT_OTHER): Payer: Medicare Other | Admitting: Hematology & Oncology

## 2012-12-11 VITALS — BP 128/68 | HR 82 | Temp 97.9°F | Resp 20

## 2012-12-11 VITALS — BP 121/67 | HR 85 | Temp 97.8°F | Resp 18 | Ht 66.0 in | Wt 172.0 lb

## 2012-12-11 DIAGNOSIS — D45 Polycythemia vera: Secondary | ICD-10-CM

## 2012-12-11 LAB — IRON AND TIBC
%SAT: 37 % (ref 20–55)
Iron: 144 ug/dL (ref 42–165)
UIBC: 247 ug/dL (ref 125–400)

## 2012-12-11 LAB — CBC WITH DIFFERENTIAL (CANCER CENTER ONLY)
BASO#: 0.1 10*3/uL (ref 0.0–0.2)
EOS%: 3.3 % (ref 0.0–7.0)
Eosinophils Absolute: 0.3 10*3/uL (ref 0.0–0.5)
HGB: 15.9 g/dL (ref 13.0–17.1)
LYMPH#: 3.4 10*3/uL — ABNORMAL HIGH (ref 0.9–3.3)
MCH: 29.9 pg (ref 28.0–33.4)
MONO%: 7.2 % (ref 0.0–13.0)
NEUT#: 5.1 10*3/uL (ref 1.5–6.5)
Platelets: 251 10*3/uL (ref 145–400)
RBC: 5.31 10*6/uL (ref 4.20–5.70)

## 2012-12-11 LAB — FERRITIN: Ferritin: 9 ng/mL — ABNORMAL LOW (ref 22–322)

## 2012-12-11 NOTE — Patient Instructions (Signed)
Therapeutic Phlebotomy Therapeutic phlebotomy is the controlled removal of blood from your body for the purpose of treating a medical condition. It is similar to donating blood. Usually, about a pint (470 mL) of blood is removed. The average adult has 9 to 12 pints (4.3 to 5.7 L) of blood. Therapeutic phlebotomy may be used to treat the following medical conditions:  Hemochromatosis. This is a condition in which there is too much iron in the blood.  Polycythemia vera. This is a condition in which there are too many red cells in the blood.  Porphyria cutanea tarda. This is a disease usually passed from one generation to the next (inherited). It is a condition in which an important part of hemoglobin is not made properly. This results in the build up of abnormal amounts of porphyrins in the body.  Sickle cell disease. This is an inherited disease. It is a condition in which the red blood cells form an abnormal crescent shape rather than a round shape. LET YOUR CAREGIVER KNOW ABOUT:  Allergies.  Medicines taken including herbs, eyedrops, over-the-counter medicines, and creams.  Use of steroids (by mouth or creams).  Previous problems with anesthetics or numbing medicine.  History of blood clots.  History of bleeding or blood problems.  Previous surgery.  Possibility of pregnancy, if this applies. RISKS AND COMPLICATIONS This is a simple and safe procedure. Problems are unlikely. However, problems can occur and may include:  Nausea or lightheadedness.  Low blood pressure.  Soreness, bleeding, swelling, or bruising at the needle insertion site.  Infection. BEFORE THE PROCEDURE  This is a procedure that can be done as an outpatient. Confirm the time that you need to arrive for your procedure. Confirm whether there is a need to fast or withhold any medications. It is helpful to wear clothing with sleeves that can be raised above the elbow. A blood sample may be done to determine the  amount of red blood cells or iron in your blood. Plan ahead of time to have someone drive you home after the procedure. PROCEDURE The entire procedure from preparation through recovery takes about 1 hour. The actual collection takes about 10 to 15 minutes.  A needle will be inserted into your vein.  Tubing and a collection bag will be attached to that needle.  Blood will flow through the needle and tubing into the collection bag.  You may be asked to open and close your hand slowly and continuously during the entire collection.  Once the specified amount of blood has been removed from your body, the collection bag and tubing will be clamped.  The needle will be removed.  Pressure will be held on the site of the needle insertion to stop the bleeding. Then a bandage will be placed over the needle insertion site. AFTER THE PROCEDURE  Your recovery will be assessed and monitored. If there are no problems, as an outpatient, you should be able to go home shortly after the procedure.  Document Released: 12/04/2010 Document Revised: 09/24/2011 Document Reviewed: 12/04/2010 [employer] Patient Information 2014 [employer].  

## 2012-12-11 NOTE — Progress Notes (Signed)
This office note has been dictated.

## 2012-12-12 NOTE — Progress Notes (Signed)
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  CURRENT THERAPY: 1. Phlebotomy to maintain hematocrit less than 45%. 2. Plavix 75 mg p.o. daily.  INTERIM HISTORY:  Robert Tran comes in for his followup.  He is doing okay. He has had no complaints.  There has been no headache.  He has had no chest pain.  There has been no shortness of breath.  He and his wife have been going down to the beach.  House down there. They have a house down there.  They go down every other week.  He has had no leg swelling.  He has not noticed any problems with fevers, sweats, or chills.  Back in April when he was last seen, his ferritin was 4.  PHYSICAL EXAM:  General:  This is a well-developed, well-nourished white gentleman in no obvious distress.  Vital signs:  Temperature 97.8, pulse 85, respiratory rate 18, blood pressure 121/67.  Weight is 172.  Head and neck:  Normocephalic, atraumatic skull.  There are no ocular or oral lesions.  There are no palpable  cervical or supraclavicular lymph nodes.  Lungs:  Clear bilaterally.  Cardiac:  Regular rate and regular rhythm.  He has normal S1, S2.  There are no murmurs, rubs or bruits. Abdominal exam:  Soft with good bowel sounds.  There is no palpable abdominal mass.  There is no palpable hepatosplenomegaly.  Extremities: Show no clubbing, cyanosis or edema.  Neurological exam:  Shows no focal neurological deficits.  LABORATORY STUDIES:  White cell count is 9.5, hemoglobin 15.9, hematocrit 47.2, platelet count 251.  EKG showed bigeminy with some premature atrial beats.  IMPRESSION:  Robert Tran is a 72 year old gentleman with polycythemia.  He is doing well with this.  So far, I seen no issues with respect to complications.  We will go ahead and phlebotomize him today.  I think this will help with respect to his blood issue and any kind of complications.  He is trying to cut back on smoking and drinking coffee.  He does take quite  a bit of caffeine.  Still there may be an issue as to why he has this bigeminy.  He is totally asymptomatic with the bigeminy.  As such, I do not think that he needs to go see a cardiologist for this.  We will plan to get him back in another 6-8 weeks.  Again, we will phlebotomize him.    ______________________________ Josph Macho, M.D. PRE/MEDQ  D:  12/11/2012  T:  12/12/2012  Job:  [id number]

## 2013-02-11 ENCOUNTER — Ambulatory Visit (HOSPITAL_BASED_OUTPATIENT_CLINIC_OR_DEPARTMENT_OTHER): Payer: Medicare Other | Admitting: Hematology & Oncology

## 2013-02-11 ENCOUNTER — Other Ambulatory Visit (HOSPITAL_BASED_OUTPATIENT_CLINIC_OR_DEPARTMENT_OTHER): Payer: Medicare Other | Admitting: Lab

## 2013-02-11 ENCOUNTER — Ambulatory Visit (HOSPITAL_BASED_OUTPATIENT_CLINIC_OR_DEPARTMENT_OTHER): Payer: Medicare Other

## 2013-02-11 VITALS — BP 128/69 | HR 79 | Temp 97.5°F | Resp 18 | Ht 67.0 in | Wt 173.0 lb

## 2013-02-11 VITALS — BP 134/61 | HR 59

## 2013-02-11 DIAGNOSIS — D45 Polycythemia vera: Secondary | ICD-10-CM

## 2013-02-11 LAB — CBC WITH DIFFERENTIAL (CANCER CENTER ONLY)
BASO#: 0.1 10*3/uL (ref 0.0–0.2)
Eosinophils Absolute: 0.3 10*3/uL (ref 0.0–0.5)
HGB: 15.6 g/dL (ref 13.0–17.1)
LYMPH#: 2.5 10*3/uL (ref 0.9–3.3)
MCH: 30 pg (ref 28.0–33.4)
MONO#: 0.8 10*3/uL (ref 0.1–0.9)
NEUT#: 6 10*3/uL (ref 1.5–6.5)
Platelets: 264 10*3/uL (ref 145–400)
RBC: 5.2 10*6/uL (ref 4.20–5.70)
WBC: 9.6 10*3/uL (ref 4.0–10.0)

## 2013-02-11 LAB — BASIC METABOLIC PANEL - CANCER CENTER ONLY
Calcium: 9.8 mg/dL (ref 8.0–10.3)
Creat: 1.2 mg/dl (ref 0.6–1.2)
Sodium: 141 mEq/L (ref 128–145)

## 2013-02-11 NOTE — Patient Instructions (Addendum)
Therapeutic Phlebotomy Therapeutic phlebotomy is the controlled removal of blood from your body for the purpose of treating a medical condition. It is similar to donating blood. Usually, about a pint (470 mL) of blood is removed. The average adult has 9 to 12 pints (4.3 to 5.7 L) of blood. Therapeutic phlebotomy may be used to treat the following medical conditions:  Hemochromatosis. This is a condition in which there is too much iron in the blood.  Polycythemia vera. This is a condition in which there are too many red cells in the blood.  Porphyria cutanea tarda. This is a disease usually passed from one generation to the next (inherited). It is a condition in which an important part of hemoglobin is not made properly. This results in the build up of abnormal amounts of porphyrins in the body.  Sickle cell disease. This is an inherited disease. It is a condition in which the red blood cells form an abnormal crescent shape rather than a round shape. LET YOUR CAREGIVER KNOW ABOUT:  Allergies.  Medicines taken including herbs, eyedrops, over-the-counter medicines, and creams.  Use of steroids (by mouth or creams).  Previous problems with anesthetics or numbing medicine.  History of blood clots.  History of bleeding or blood problems.  Previous surgery.  Possibility of pregnancy, if this applies. RISKS AND COMPLICATIONS This is a simple and safe procedure. Problems are unlikely. However, problems can occur and may include:  Nausea or lightheadedness.  Low blood pressure.  Soreness, bleeding, swelling, or bruising at the needle insertion site.  Infection. BEFORE THE PROCEDURE  This is a procedure that can be done as an outpatient. Confirm the time that you need to arrive for your procedure. Confirm whether there is a need to fast or withhold any medications. It is helpful to wear clothing with sleeves that can be raised above the elbow. A blood sample may be done to determine the  amount of red blood cells or iron in your blood. Plan ahead of time to have someone drive you home after the procedure. PROCEDURE The entire procedure from preparation through recovery takes about 1 hour. The actual collection takes about 10 to 15 minutes.  A needle will be inserted into your vein.  Tubing and a collection bag will be attached to that needle.  Blood will flow through the needle and tubing into the collection bag.  You may be asked to open and close your hand slowly and continuously during the entire collection.  Once the specified amount of blood has been removed from your body, the collection bag and tubing will be clamped.  The needle will be removed.  Pressure will be held on the site of the needle insertion to stop the bleeding. Then a bandage will be placed over the needle insertion site. AFTER THE PROCEDURE  Your recovery will be assessed and monitored. If there are no problems, as an outpatient, you should be able to go home shortly after the procedure.  Document Released: 12/04/2010 Document Revised: 09/24/2011 Document Reviewed: 12/04/2010 [employer] Patient Information 2014 [employer].  

## 2013-02-11 NOTE — Progress Notes (Signed)
Robert Tran presents today for phlebotomy per MD orders. Phlebotomy procedure started at 1345 and ended at 1350 . 400 grams removed. Patient observed for 30 minutes after procedure without any incident. Patient tolerated procedure well. IV needle removed intact.

## 2013-02-11 NOTE — Progress Notes (Signed)
This office note has been dictated.

## 2013-02-12 LAB — FERRITIN CHCC: Ferritin: 8 ng/ml — ABNORMAL LOW (ref 22–316)

## 2013-02-12 LAB — IRON AND TIBC CHCC
%SAT: 17 % — ABNORMAL LOW (ref 20–55)
Iron: 69 ug/dL (ref 42–163)
TIBC: 394 ug/dL (ref 202–409)

## 2013-02-12 NOTE — Progress Notes (Signed)
CC:   Robert Organ, MD  DIAGNOSIS:  Polycythemia vera, JAK2 negative.  CURRENT THERAPY: 1. Phlebotomy to maintain hematocrit less than 45%. 2. Plavix 75 mg p.o. q. day.  INTERIM HISTORY:  Robert Tran comes in for his followup.  He is doing okay. He gets phlebotomized pretty much every 6-8 weeks.  He has had Tran problems with nausea or vomiting.  He has had Tran headache.  He is still smoking half a pack per day of cigarettes.  He is also drinking about 6 cups of coffee a day.  I think this is part of why his blood count tends to trend upward.  We have made him iron-deficient.  When he was here back in May, his ferritin was 9.  He has had Tran change in bowel or bladder habits.  PHYSICAL EXAM:  General:  This is a well-developed, well-nourished white gentleman in Tran obvious distress.  Vital Signs:  Show a temperature of 97.5, pulse of 79, respiratory rate 18, blood pressure 128/69.  Weight is 173.  Head and Neck:  Show a normocephalic, atraumatic skull.  There are Tran ocular or oral lesions.  He has Tran palpable cervical or supraclavicular lymph nodes.  There is some facial plethora.  He has some slight conjunctival inflammation.  Lungs:  Clear bilaterally. Cardiac:  Regular rate and rhythm with a normal S1 and S2.  There are Tran murmurs, rubs, or bruits.  Abdomen:  Soft.  He has good bowel sounds. There is Tran fluid wave.  There is Tran palpable hepatosplenomegaly. Extremities:  Show Tran clubbing, cyanosis, or edema.  Neurological: Shows Tran focal neurological deficits.  Skin:  Does show some ruddy complexion.  LABORATORY STUDIES:  White cell count is 9.6.  Hemoglobin 15.6, hematocrit 46.6, platelet count 264.  IMPRESSION:  Robert Tran is a 72 year old gentleman with polycythemia vera. He does well with phlebotomies.  We have had Tran problems so far with him being phlebotomized.  He has had Tran complications from the polycythemia.  We will go ahead and plan to get him back after Labor Day now.   We will go ahead and phlebotomize him today.   ______________________________ Josph Macho, M.D. PRE/MEDQ  D:  02/11/2013  T:  02/12/2013  Job:  [id number]

## 2013-04-16 ENCOUNTER — Ambulatory Visit (HOSPITAL_BASED_OUTPATIENT_CLINIC_OR_DEPARTMENT_OTHER): Payer: Medicare Other | Admitting: Hematology & Oncology

## 2013-04-16 ENCOUNTER — Ambulatory Visit: Payer: Medicare Other

## 2013-04-16 ENCOUNTER — Other Ambulatory Visit (HOSPITAL_BASED_OUTPATIENT_CLINIC_OR_DEPARTMENT_OTHER): Payer: Medicare Other | Admitting: Lab

## 2013-04-16 VITALS — BP 140/69 | HR 84 | Temp 98.8°F | Resp 18 | Ht 67.0 in | Wt 174.0 lb

## 2013-04-16 DIAGNOSIS — D45 Polycythemia vera: Secondary | ICD-10-CM

## 2013-04-16 DIAGNOSIS — F17200 Nicotine dependence, unspecified, uncomplicated: Secondary | ICD-10-CM

## 2013-04-16 DIAGNOSIS — R5381 Other malaise: Secondary | ICD-10-CM

## 2013-04-16 DIAGNOSIS — D509 Iron deficiency anemia, unspecified: Secondary | ICD-10-CM

## 2013-04-16 LAB — CBC WITH DIFFERENTIAL (CANCER CENTER ONLY)
BASO#: 0.1 10*3/uL (ref 0.0–0.2)
Eosinophils Absolute: 0.3 10*3/uL (ref 0.0–0.5)
HGB: 16.2 g/dL (ref 13.0–17.1)
LYMPH%: 32.4 % (ref 14.0–48.0)
MCH: 30 pg (ref 28.0–33.4)
MCV: 88 fL (ref 82–98)
MONO%: 8.9 % (ref 0.0–13.0)
NEUT%: 55.5 % (ref 40.0–80.0)
RBC: 5.4 10*6/uL (ref 4.20–5.70)

## 2013-04-16 NOTE — Progress Notes (Signed)
This office note has been dictated.

## 2013-04-17 ENCOUNTER — Telehealth: Payer: Self-pay | Admitting: Hematology & Oncology

## 2013-04-17 NOTE — Progress Notes (Signed)
CC:   Arlan Organ, MD  DIAGNOSIS:  Polycythemia vera -- JAK2 negative.  CURRENT THERAPY: 1. Phlebotomy to maintain hematocrit less than 45%. 2. Plavix 75 mg p.o. daily.  INTERIM HISTORY:  Robert Tran comes in for his followup.  We last saw him back in July.  At that point in time, his hematocrit was 46.6.  We phlebotomized him.  He is iron deficient.  His last ferritin was only 8.  He is still smoking.  He is probably smoking a pack a day.  I think this is a big factor in his hemoglobin and hematocrit going up.  He has cut back on his coffee he uses.  He is down to 5 cups a day now.  He has had no headache.  He has had no burning in the hands or feet. There has been no change in bowel or bladder habits.  He has not noticed any leg swelling.  There have been no rashes.  PHYSICAL EXAMINATION:  General:  This is a plethoric-appearing patient. He is in no obvious distress.  Vital Signs:  Temperature of 97.8, pulse 84, respiratory rate 18, blood pressure 140/69.  Weight is 174 pounds. Head and neck:  Normocephalic, atraumatic skull.  There are no ocular or oral lesions.  There are no palpable cervical or supraclavicular lymph nodes.  Lungs:  Clear bilaterally.  Cardiac:  Regular rate and rhythm with a normal S1 and S2.  There are no murmurs, rubs, or bruits. Abdomen:  Soft.  He has good bowel sounds.  There is no fluid wave. There is no palpable abdominal mass.  There is no palpable hepatosplenomegaly.  Extremities:  No clubbing, cyanosis, or edema. Skin:  Ruddy complexion.  Neurological.  No focal neurological deficits.  LABORATORY STUDIES:  White cell count is 10.9, hemoglobin is 16.2, hematocrit 47.3, platelet count 266.  MCV is 88.  IMPRESSION:  Robert Tran is a nice 72 year old gentleman.  He has polycythemia.  He is JAK2 negative.  However, I still believe that he does have polycythemia, given his low ferritin.  He had a low erythropoietin level.  We will go ahead and  phlebotomize him.  I will give him some IV fluids afterward as he says that after his phlebotomy, he feels quite weak.  We will plan to get him back to see [place] in another 2 months.  I think this is a pretty good interval to see him.    ______________________________ Josph Macho, M.D. PRE/MEDQ  D:  04/16/2013  T:  04/17/2013  Job:  [id number]

## 2013-04-17 NOTE — Telephone Encounter (Signed)
Mailed November schedule to patient

## 2013-06-05 ENCOUNTER — Other Ambulatory Visit (HOSPITAL_BASED_OUTPATIENT_CLINIC_OR_DEPARTMENT_OTHER): Payer: Medicare Other | Admitting: Lab

## 2013-06-05 ENCOUNTER — Ambulatory Visit (HOSPITAL_BASED_OUTPATIENT_CLINIC_OR_DEPARTMENT_OTHER): Payer: Medicare Other | Admitting: Hematology & Oncology

## 2013-06-05 ENCOUNTER — Ambulatory Visit (HOSPITAL_BASED_OUTPATIENT_CLINIC_OR_DEPARTMENT_OTHER): Payer: Medicare Other

## 2013-06-05 VITALS — BP 124/66 | HR 88 | Temp 98.0°F | Resp 16 | Ht 67.0 in | Wt 175.0 lb

## 2013-06-05 DIAGNOSIS — D45 Polycythemia vera: Secondary | ICD-10-CM

## 2013-06-05 DIAGNOSIS — F17200 Nicotine dependence, unspecified, uncomplicated: Secondary | ICD-10-CM

## 2013-06-05 LAB — IRON AND TIBC CHCC: %SAT: 18 % — ABNORMAL LOW (ref 20–55)

## 2013-06-05 LAB — CBC WITH DIFFERENTIAL (CANCER CENTER ONLY)
BASO%: 0.8 % (ref 0.0–2.0)
Eosinophils Absolute: 0.3 10*3/uL (ref 0.0–0.5)
LYMPH%: 33.1 % (ref 14.0–48.0)
MCH: 29.7 pg (ref 28.0–33.4)
MCV: 90 fL (ref 82–98)
MONO%: 7.4 % (ref 0.0–13.0)
Platelets: 256 10*3/uL (ref 145–400)
RDW: 14.4 % (ref 11.1–15.7)

## 2013-06-05 LAB — FERRITIN CHCC: Ferritin: 9 ng/ml — ABNORMAL LOW (ref 22–316)

## 2013-06-05 NOTE — Patient Instructions (Signed)
Therapeutic Phlebotomy Therapeutic phlebotomy is the controlled removal of blood from your body for the purpose of treating a medical condition. It is similar to donating blood. Usually, about a pint (470 mL) of blood is removed. The average adult has 9 to 12 pints (4.3 to 5.7 L) of blood. Therapeutic phlebotomy may be used to treat the following medical conditions:  Hemochromatosis. This is a condition in which there is too much iron in the blood.  Polycythemia vera. This is a condition in which there are too many red cells in the blood.  Porphyria cutanea tarda. This is a disease usually passed from one generation to the next (inherited). It is a condition in which an important part of hemoglobin is not made properly. This results in the build up of abnormal amounts of porphyrins in the body.  Sickle cell disease. This is an inherited disease. It is a condition in which the red blood cells form an abnormal crescent shape rather than a round shape. LET YOUR CAREGIVER KNOW ABOUT:  Allergies.  Medicines taken including herbs, eyedrops, over-the-counter medicines, and creams.  Use of steroids (by mouth or creams).  Previous problems with anesthetics or numbing medicine.  History of blood clots.  History of bleeding or blood problems.  Previous surgery.  Possibility of pregnancy, if this applies. RISKS AND COMPLICATIONS This is a simple and safe procedure. Problems are unlikely. However, problems can occur and may include:  Nausea or lightheadedness.  Low blood pressure.  Soreness, bleeding, swelling, or bruising at the needle insertion site.  Infection. BEFORE THE PROCEDURE  This is a procedure that can be done as an outpatient. Confirm the time that you need to arrive for your procedure. Confirm whether there is a need to fast or withhold any medications. It is helpful to wear clothing with sleeves that can be raised above the elbow. A blood sample may be done to determine the  amount of red blood cells or iron in your blood. Plan ahead of time to have someone drive you home after the procedure. PROCEDURE The entire procedure from preparation through recovery takes about 1 hour. The actual collection takes about 10 to 15 minutes.  A needle will be inserted into your vein.  Tubing and a collection bag will be attached to that needle.  Blood will flow through the needle and tubing into the collection bag.  You may be asked to open and close your hand slowly and continuously during the entire collection.  Once the specified amount of blood has been removed from your body, the collection bag and tubing will be clamped.  The needle will be removed.  Pressure will be held on the site of the needle insertion to stop the bleeding. Then a bandage will be placed over the needle insertion site. AFTER THE PROCEDURE  Your recovery will be assessed and monitored. If there are no problems, as an outpatient, you should be able to go home shortly after the procedure.  Document Released: 12/04/2010 Document Revised: 09/24/2011 Document Reviewed: 12/04/2010 [employer] Patient Information 2014 [employer].  

## 2013-06-05 NOTE — Progress Notes (Signed)
This office note has been dictated.

## 2013-06-07 NOTE — Progress Notes (Signed)
CC:   Robert Organ, MD  DIAGNOSIS:  Polycythemia vera -- JAK2 negative.  CURRENT THERAPY: 1. Normal phlebotomy to maintain hematocrit less than 45%. 2. Plavix 75 mg p.o. daily.  INTERIM HISTORY:  Robert Tran comes in for his followup.  We last saw him back in October.  He typically gets phlebotomized when we will see him every couple of months.  He has been doing okay.  He has cut back on his tobacco use.  He has cut back on his coffee use.  He is still pretty active.  He has had no problems with headache.  He has had no nausea or vomiting.  There has been no pain in his hands or feet.  He has had no leg swelling.  He has had no mouth sores.  He has had no visual issues.  PHYSICAL EXAMINATION:  General:  This is a well-developed, well- nourished white gentleman in no obvious distress.  Vital Signs:  Show temperature of 98, pulse 88, respiratory rate 16, blood pressure 124/66. Weight is 175 pounds.  Head and Neck:  Shows a normocephalic, atraumatic skull.  There are no ocular or oral lesions.  There are no palpable cervical or supraclavicular lymph nodes.  Lungs:  Clear bilaterally. Cardiac:  Regular rate and rhythm with a normal S1 and S2.  There are no murmurs, rubs or bruits.  Abdomen:  Soft.  He has good bowel sounds. There is no fluid wave.  There is no palpable abdominal mass.  There is no palpable hepatosplenomegaly.  Back:  No tenderness over the spine, ribs, or hips.  Extremities:  Show no clubbing, cyanosis or edema. Neurological:  No focal neurological deficits.  Skin:  Does show some facial plethora.  He has a little bit of a ruddy complexion.  LABORATORY STUDIES:  White cell count is 9.8, hemoglobin 15.5, hematocrit 46.9, platelet count 256.  MCV is 90.  IMPRESSION:  Robert Tran is a nice 72 year old gentleman with polycythemia. He is asymptomatic with this.  He is on Plavix.  I do not believe the Plavix is anything related to polycythemia issues.  We will go ahead and  phlebotomize him.  We can be flexible with our phlebotomies because he has done well.  I do not think we have to be too strict in keeping his hematocrit below 45%.  We will go ahead and plan to get him back in 2 more months.  Again, I think the more he stops smoking and cutting back his caffeine use, his hemoglobin may trend down on its own.   ______________________________ Josph Macho, M.D. PRE/MEDQ  D:  06/05/2013  T:  06/06/2013  Job:  [id number]

## 2013-08-07 ENCOUNTER — Encounter: Payer: Self-pay | Admitting: Hematology & Oncology

## 2013-08-07 ENCOUNTER — Ambulatory Visit (HOSPITAL_BASED_OUTPATIENT_CLINIC_OR_DEPARTMENT_OTHER): Payer: Medicare Other

## 2013-08-07 ENCOUNTER — Other Ambulatory Visit (HOSPITAL_BASED_OUTPATIENT_CLINIC_OR_DEPARTMENT_OTHER): Payer: Medicare Other | Admitting: Lab

## 2013-08-07 ENCOUNTER — Ambulatory Visit (HOSPITAL_BASED_OUTPATIENT_CLINIC_OR_DEPARTMENT_OTHER): Payer: Medicare Other | Admitting: Hematology & Oncology

## 2013-08-07 VITALS — BP 120/59 | HR 85 | Temp 97.6°F | Resp 18 | Ht 65.0 in | Wt 176.0 lb

## 2013-08-07 VITALS — BP 118/68 | HR 79 | Temp 98.1°F | Resp 18

## 2013-08-07 DIAGNOSIS — D45 Polycythemia vera: Secondary | ICD-10-CM

## 2013-08-07 LAB — IRON AND TIBC CHCC
%SAT: 23 % (ref 20–55)
IRON: 87 ug/dL (ref 42–163)
TIBC: 376 ug/dL (ref 202–409)
UIBC: 289 ug/dL (ref 117–376)

## 2013-08-07 LAB — CBC WITH DIFFERENTIAL (CANCER CENTER ONLY)
BASO#: 0 10*3/uL (ref 0.0–0.2)
BASO%: 0.4 % (ref 0.0–2.0)
EOS%: 2.8 % (ref 0.0–7.0)
Eosinophils Absolute: 0.3 10*3/uL (ref 0.0–0.5)
HCT: 46.8 % (ref 38.7–49.9)
HGB: 15.4 g/dL (ref 13.0–17.1)
LYMPH#: 2.8 10*3/uL (ref 0.9–3.3)
LYMPH%: 30 % (ref 14.0–48.0)
MCH: 29.3 pg (ref 28.0–33.4)
MCHC: 32.9 g/dL (ref 32.0–35.9)
MCV: 89 fL (ref 82–98)
MONO#: 0.5 10*3/uL (ref 0.1–0.9)
MONO%: 5.9 % (ref 0.0–13.0)
NEUT#: 5.6 10*3/uL (ref 1.5–6.5)
NEUT%: 60.9 % (ref 40.0–80.0)
PLATELETS: 290 10*3/uL (ref 145–400)
RBC: 5.25 10*6/uL (ref 4.20–5.70)
RDW: 14.1 % (ref 11.1–15.7)
WBC: 9.2 10*3/uL (ref 4.0–10.0)

## 2013-08-07 LAB — FERRITIN CHCC: Ferritin: 11 ng/ml — ABNORMAL LOW (ref 22–316)

## 2013-08-07 NOTE — Patient Instructions (Signed)

## 2013-08-07 NOTE — Progress Notes (Signed)
Robert Tran presents today for phlebotomy per MD orders. Phlebotomy procedure started at 1115and ended at 1137. Approximately 500 mls removed. Patient observed for 30 minutes after procedure without any incident. Patient tolerated procedure well. IV needle removed intact.

## 2013-08-07 NOTE — Progress Notes (Signed)
This office note has been dictated.

## 2013-08-08 NOTE — Progress Notes (Signed)
CC:   Robert Tran  DIAGNOSIS:  Polycythemia vera - JAK2 negative.  CURRENT THERAPY: 1. Phlebotomy to maintain hematocrit less than 45%. 2. Plavix 75 mg p.o. daily.  INTERIM HISTORY:  Robert Tran comes in for followup.  He is doing fairly well.  We phlebotomized him last time he was here.  He is still cutting back on his tobacco use.  He is also cutting back on coffee use.  He has had no problems with headache.  He has had no weakness.  He has had no nausea or vomiting.  There has been no change in bowel or bladder habits.  We have made him iron deficient.  His last ferritin was 9.  PHYSICAL EXAMINATION:  General:  This is a well-developed, well- nourished white gentleman in no obvious distress.  Vital Signs: Temperature of 97.6, pulse 85, respiratory rate 18, blood pressure 120/59, and weight is 176 pounds.  Head and Neck:  Normocephalic, atraumatic skull.  He has some slight facial plethora.  There is no scleral icterus.  There is no conjunctival inflammation.  There is no adenopathy in the neck.  Lungs:  Clear bilaterally.  Cardiac:  Regular rate and rhythm with a normal S1 and S2.  There are no murmurs, rubs, or bruits.  Abdomen:  Soft.  He has good bowel sounds.  There is no fluid wave.  There is no palpable abdominal mass.  There is no palpable hepatosplenomegaly.  Back:  No tenderness over the spine, ribs, or hips. Extremities:  No clubbing, cyanosis, or edema.  Neurological:  No focal neurological deficits.  Skin:  No rashes, ecchymoses, or petechia.  LABORATORY STUDIES:  White cell count is 9.2, hemoglobin 15.4, hematocrit 46.8, platelet count 290.  IMPRESSION:  Robert Tran is a very nice 73 year old gentleman.  He has polycythemia.  He is JAK2 negative.  However, he responds well to phlebotomies.  We will go ahead and phlebotomize him today.  I again reiterated the importance of trying to keep his hematocrit below 45%.  He does well with IV fluids after his  phlebotomies.  We will go ahead and plan to get him back in 3 months' time.  He would like to try to spread his appointments out to every 3 months.  I think this is reasonable.  ADDENDUM Robert Tran does have some nephrolithiasis.  He is going to see his urologist down in [place] I think next Friday.    ______________________________ Volanda Napoleon, M.D. PRE/MEDQ  D:  08/07/2013  T:  08/08/2013  Job:  [id number]

## 2013-11-04 ENCOUNTER — Ambulatory Visit (HOSPITAL_BASED_OUTPATIENT_CLINIC_OR_DEPARTMENT_OTHER): Payer: Medicare Other

## 2013-11-04 ENCOUNTER — Other Ambulatory Visit (HOSPITAL_BASED_OUTPATIENT_CLINIC_OR_DEPARTMENT_OTHER): Payer: Medicare Other | Admitting: Lab

## 2013-11-04 ENCOUNTER — Encounter: Payer: Self-pay | Admitting: Hematology & Oncology

## 2013-11-04 ENCOUNTER — Ambulatory Visit (HOSPITAL_BASED_OUTPATIENT_CLINIC_OR_DEPARTMENT_OTHER): Payer: Medicare Other | Admitting: Hematology & Oncology

## 2013-11-04 VITALS — BP 130/66 | HR 84 | Temp 97.5°F | Resp 18 | Ht 66.0 in | Wt 177.0 lb

## 2013-11-04 DIAGNOSIS — D45 Polycythemia vera: Secondary | ICD-10-CM

## 2013-11-04 LAB — CBC WITH DIFFERENTIAL (CANCER CENTER ONLY)
BASO#: 0.1 10*3/uL (ref 0.0–0.2)
BASO%: 0.9 % (ref 0.0–2.0)
EOS%: 2.6 % (ref 0.0–7.0)
Eosinophils Absolute: 0.2 10*3/uL (ref 0.0–0.5)
HCT: 47.4 % (ref 38.7–49.9)
HEMOGLOBIN: 16 g/dL (ref 13.0–17.1)
LYMPH#: 2.2 10*3/uL (ref 0.9–3.3)
LYMPH%: 26.5 % (ref 14.0–48.0)
MCH: 30.1 pg (ref 28.0–33.4)
MCHC: 33.8 g/dL (ref 32.0–35.9)
MCV: 89 fL (ref 82–98)
MONO#: 0.5 10*3/uL (ref 0.1–0.9)
MONO%: 6.6 % (ref 0.0–13.0)
NEUT%: 63.4 % (ref 40.0–80.0)
NEUTROS ABS: 5.2 10*3/uL (ref 1.5–6.5)
Platelets: 249 10*3/uL (ref 145–400)
RBC: 5.32 10*6/uL (ref 4.20–5.70)
RDW: 14.9 % (ref 11.1–15.7)
WBC: 8.2 10*3/uL (ref 4.0–10.0)

## 2013-11-04 NOTE — Patient Instructions (Signed)
You Can Quit Smoking If you are ready to quit smoking or are thinking about it, congratulations! You have chosen to help yourself be healthier and live longer! There are lots of different ways to quit smoking. Nicotine gum, nicotine patches, a nicotine inhaler, or nicotine nasal spray can help with physical craving. Hypnosis, support groups, and medicines help break the habit of smoking. TIPS TO GET OFF AND STAY OFF CIGARETTES  Learn to predict your moods. Do not let a bad situation be your excuse to have a cigarette. Some situations in your life might tempt you to have a cigarette.  Ask friends and co-workers not to smoke around you.  Make your home smoke-free.  Never have "just one" cigarette. It leads to wanting another and another. Remind yourself of your decision to quit.  On a card, make a list of your reasons for not smoking. Read it at least the same number of times a day as you have a cigarette. Tell yourself everyday, "I do not want to smoke. I choose not to smoke."  Ask someone at home or work to help you with your plan to quit smoking.  Have something planned after you eat or have a cup of coffee. Take a walk or get other exercise to perk you up. This will help to keep you from overeating.  Try a relaxation exercise to calm you down and decrease your stress. Remember, you may be tense and nervous the first two weeks after you quit. This will pass.  Find new activities to keep your hands busy. Play with a pen, coin, or rubber band. Doodle or draw things on paper.  Brush your teeth right after eating. This will help cut down the craving for the taste of tobacco after meals. You can try mouthwash too.  Try gum, breath mints, or diet candy to keep something in your mouth. IF YOU SMOKE AND WANT TO QUIT:  Do not stock up on cigarettes. Never buy a carton. Wait until one pack is finished before you buy another.  Never carry cigarettes with you at work or at home.  Keep cigarettes  as far away from you as possible. Leave them with someone else.  Never carry matches or a lighter with you.  Ask yourself, "Do I need this cigarette or is this just a reflex?"  Bet with someone that you can quit. Put cigarette money in a piggy bank every morning. If you smoke, you give up the money. If you do not smoke, by the end of the week, you keep the money.  Keep trying. It takes 21 days to change a habit!  Talk to your doctor about using medicines to help you quit. These include nicotine replacement gum, lozenges, or skin patches. Document Released: 04/28/2009 Document Revised: 09/24/2011 Document Reviewed: 04/28/2009 [employer] Patient Information 2014 [employer].  

## 2013-11-04 NOTE — Progress Notes (Signed)
Hematology and Oncology Follow Up Visit  Case Vassell [id number] September 28, 1940 73 y.o. 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  Ticas is back for followup. He is doing okay. Last him back in January. He had no problems over the wintertime. He's had no headache. He's had no issues with fatigue or weakness. There is still smoking but cutting back on this. Presented change in bowel or bladder habits. He's had no rashes. He did have a rash on his legs a couple weeks ago. Is down at the beach. He got some steroid cream for this. This improved the rash.  He's had no fever. He's had no visual issues.  Medications: Current outpatient prescriptions:Cholecalciferol (VITAMIN D) 2000 UNITS tablet, Take 2,000 Units by mouth daily., Disp: , Rfl: ;  clopidogrel (PLAVIX) 75 MG tablet, Take 75 mg by mouth daily., Disp: , Rfl: ;  Folic Acid 387 MCG TABS, Take 400 mcg by mouth every morning., Disp: , Rfl: ;  loxapine (LOXITANE) 10 MG capsule, Take 10 mg by mouth daily., Disp: , Rfl: ;  ramipril (ALTACE) 10 MG tablet, Take 10 mg by mouth daily., Disp: , Rfl:  torsemide (DEMADEX) 20 MG tablet, Take 20 mg by mouth as needed., Disp: , Rfl:   Allergies:  Allergies  Allergen Reactions  . Sulfa Drugs Cross Reactors     Dizziness, nausea    Past Medical History, Surgical history, Social history, and Family History were reviewed and updated.  Review of Systems: As above  Physical Exam:  height is 5\' 6"  (1.676 m) and weight is 177 lb (80.287 kg). His oral temperature is 97.5 F (36.4 C). His blood pressure is 130/66 and his pulse is 84. His respiration is 18.   Well-developed and well-nourished white gentleman. Head and neck exam shows no ocular or oral lesions. He has no scleral icterus. There is no conjunctival inflammation.. Lungs are clear. Cardiac exam  regular in rhythm with no murmurs rubs or bruits. Abdomen is soft. Has good bowel sounds. He has no fluid wave. There is no palpable liver or spleen to. Back exam no tenderness over the spine ribs or hips. Extremities shows no clubbing cyanosis or edema. Has good range motion of his joints. Skin exam no rashes. Neurological exam no focal neurological deficits. Lab Results  Component Value Date   WBC 8.2 11/04/2013   HGB 16.0 11/04/2013   HCT 47.4 11/04/2013   MCV 89 11/04/2013   PLT 249 11/04/2013     Chemistry      Component Value Date/Time   NA 141 02/11/2013 1158   K 4.2 02/11/2013 1158   CL 107 02/11/2013 1158   CO2 26 02/11/2013 1158   BUN 22 02/11/2013 1158   CREATININE 1.2 02/11/2013 1158      Component Value Date/Time   CALCIUM 9.8 02/11/2013 1158         Impression and Plan: Mr. Files is a 74 year old 7. His polycythemia. We're phlebotomizing him. He's had no complications since we've been a phlebotomizing him. He's had no cardiovascular or cerebrovascular issues.  We will go ahead and phlebotomize him why he is here. She'll then go to the beach which he always does.  I'll plan to get back to see me in another 3 months.   Volanda Napoleon, MD 4/22/201510:59 AM

## 2013-11-04 NOTE — Patient Instructions (Signed)

## 2013-11-05 ENCOUNTER — Other Ambulatory Visit: Payer: Medicare Other | Admitting: Lab

## 2013-11-05 ENCOUNTER — Ambulatory Visit: Payer: Medicare Other | Admitting: Hematology & Oncology

## 2014-02-03 ENCOUNTER — Telehealth: Payer: Self-pay | Admitting: Hematology & Oncology

## 2014-02-03 ENCOUNTER — Ambulatory Visit (HOSPITAL_BASED_OUTPATIENT_CLINIC_OR_DEPARTMENT_OTHER): Payer: Medicare Other

## 2014-02-03 ENCOUNTER — Other Ambulatory Visit (HOSPITAL_BASED_OUTPATIENT_CLINIC_OR_DEPARTMENT_OTHER): Payer: Medicare Other | Admitting: Lab

## 2014-02-03 ENCOUNTER — Ambulatory Visit (HOSPITAL_BASED_OUTPATIENT_CLINIC_OR_DEPARTMENT_OTHER): Payer: Medicare Other | Admitting: Hematology & Oncology

## 2014-02-03 ENCOUNTER — Encounter: Payer: Self-pay | Admitting: Hematology & Oncology

## 2014-02-03 VITALS — BP 141/81 | HR 75 | Temp 97.5°F | Resp 18 | Ht 67.0 in | Wt 175.0 lb

## 2014-02-03 DIAGNOSIS — D45 Polycythemia vera: Secondary | ICD-10-CM

## 2014-02-03 LAB — CBC WITH DIFFERENTIAL (CANCER CENTER ONLY)
BASO#: 0.1 10*3/uL (ref 0.0–0.2)
BASO%: 0.6 % (ref 0.0–2.0)
EOS ABS: 0.2 10*3/uL (ref 0.0–0.5)
EOS%: 2.1 % (ref 0.0–7.0)
HCT: 48.8 % (ref 38.7–49.9)
HEMOGLOBIN: 16.9 g/dL (ref 13.0–17.1)
LYMPH#: 2.6 10*3/uL (ref 0.9–3.3)
LYMPH%: 29.4 % (ref 14.0–48.0)
MCH: 31.1 pg (ref 28.0–33.4)
MCHC: 34.6 g/dL (ref 32.0–35.9)
MCV: 90 fL (ref 82–98)
MONO#: 0.7 10*3/uL (ref 0.1–0.9)
MONO%: 7.6 % (ref 0.0–13.0)
NEUT%: 60.3 % (ref 40.0–80.0)
NEUTROS ABS: 5.3 10*3/uL (ref 1.5–6.5)
Platelets: 258 10*3/uL (ref 145–400)
RBC: 5.43 10*6/uL (ref 4.20–5.70)
RDW: 14.7 % (ref 11.1–15.7)
WBC: 8.8 10*3/uL (ref 4.0–10.0)

## 2014-02-03 LAB — IRON AND TIBC CHCC
%SAT: 23 % (ref 20–55)
Iron: 84 ug/dL (ref 42–163)
TIBC: 359 ug/dL (ref 202–409)
UIBC: 275 ug/dL (ref 117–376)

## 2014-02-03 LAB — FERRITIN CHCC: FERRITIN: 17 ng/mL — AB (ref 22–316)

## 2014-02-03 NOTE — Telephone Encounter (Signed)
Pt's wife picked up completed disability forms for [place] today.  COPY SCANNED

## 2014-02-03 NOTE — Progress Notes (Signed)
Robert Tran presents today for phlebotomy per MD orders. Phlebotomy procedure started at 1145 and ended at 1150 500 cc removed. Patient tolerated procedure well. IV needle removed intact. Nourishments provided and wife at chairside.

## 2014-02-03 NOTE — Patient Instructions (Signed)
Therapeutic Phlebotomy Care After Refer to this sheet in the next few weeks. These instructions provide you with information on caring for yourself after your procedure. Your caregiver may also give you more specific instructions. Your treatment has been planned according to current medical practices, but problems sometimes occur. Call your caregiver if you have any problems or questions after your procedure. HOME CARE INSTRUCTIONS Most people can go back to their normal activities right away. Before you leave, be sure to ask if there is anything you should or should not do. In general, it would be wise to:  Keep the bandage dry. You can remove the bandage after about 5 hours.  Eat well-balanced meals for the next 24 hours.  Drink enough fluids to keep your urine clear or pale yellow.  Avoid drinking alcohol minimally until after eating.  Avoid smoking for at least 30 minutes after the procedure.  Avoid strenous physical activity or heavy lifting or pulling for about 5 hours after the procedure.  Athletes should avoid strenous exercise for 12 hours or more.  Change positions slowly for the remainder of the day to prevent lightheadedness or fainting.  If you feel lightheaded, lie down until the feeling subsides.  If you have bleeding from the needle insertion site, elevate your arm and press firmly on the site until the bleeding stops.  If bruising or bleeding appears under the skin, apply ice to the area for 15 to 20 minutes, 3 to 4 times per day. Put the ice in a plastic bag and place a towel between the bag of ice and your skin. Do this while you are awake for the first 24 hours. The ice packs can be stopped before 24 hours if the swelling goes away. If swelling persists after 24 hours, a warm, moist washcloth can be applied to the area for 15 to 20 minutes, 3 to 4 times per day. The warm, moist treatments can be stopped when the swelling goes away.  It is important to continue further  therapeutic phlebotomy as directed by your caregiver. SEEK MEDICAL CARE IF:  There is bleeding or fluid leaking from the needle insertion site.  The needle insertion site becomes swollen, red, or sore.  You feel lightheaded, dizzy or nauseated, and the feeling does not go away.  You notice new bruising at the needle insertion site.  You feel more weak or tired than normal.  You develop a fever. SEEK IMMEDIATE MEDICAL CARE IF:   There is increased bleeding, pain, or swelling from the needle insertion site.  You have severe nausea or vomiting.  You have chest pain.  You have trouble breathing. MAKE SURE YOU:  Understand these instructions.  Will watch your condition.  Will get help right away if you are not doing well or get worse. Document Released: 12/04/2010 Document Revised: 09/24/2011 Document Reviewed: 12/04/2010 [facility] Patient Information 2015 [place], [place]. This information is not intended to replace advice given to you by your health care provider. Make sure you discuss any questions you have with your health care provider.

## 2014-02-03 NOTE — Progress Notes (Signed)
Hematology and Oncology Follow Up Visit  Robert Tran [id number] Dec 09, 1940 73 y.o. 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  Robert Tran is back for followup. Last saw back in April. He gets phlebotomized whenever we see him.  He is still smoking. He probably smokes about a pack a day from what he tells.. Does drink quite a coffee.  He's had no headache. He's had no shortness of breath. He's had no cough. There's been no nausea vomiting. He's had no change in bowel or bladder habits.  He is on Plavix. He has had a history of cerebrovascular disease.  We last saw him, his ferritin was down to 11. He clearly is iron deficient. Medications: Current outpatient prescriptions:Cholecalciferol (VITAMIN D) 2000 UNITS tablet, Take 2,000 Units by mouth daily., Disp: , Rfl: ;  clopidogrel (PLAVIX) 75 MG tablet, Take 75 mg by mouth daily., Disp: , Rfl: ;  Folic Acid 893 MCG TABS, Take 400 mcg by mouth every morning., Disp: , Rfl: ;  loxapine (LOXITANE) 10 MG capsule, Take 10 mg by mouth daily., Disp: , Rfl: ;  ramipril (ALTACE) 10 MG tablet, Take 10 mg by mouth daily., Disp: , Rfl:  torsemide (DEMADEX) 20 MG tablet, Take 20 mg by mouth as needed., Disp: , Rfl:   Allergies:  Allergies  Allergen Reactions  . Sulfa Drugs Cross Reactors     Dizziness, nausea    Past Medical History, Surgical history, Social history, and Family History were reviewed and updated.  Review of Systems: As above  Physical Exam:  height is 5\' 7"  (1.702 m) and weight is 175 lb (79.379 kg). His oral temperature is 97.5 F (36.4 C). His blood pressure is 141/81 and his pulse is 75. His respiration is 18.   We'll go and well-nourished gentleman in no obvious distress. Head and neck exam shows no ocular or oral lesions. He has no scleral icterus. There is no conjunctival  inflammation. Neck is supple with no adenopathy. Lungs are clear. Cardiac exam regular in rhythm with no murmurs rubs or bruits. Abdomen soft. Has good bowel sounds. There is no fluid wave. There is a palpable liver or spleen tip. Back exam no tenderness over the spine ribs or hips. Extremities shows no clubbing cyanosis or edema. Neurological exam shows no focal neurological deficits. Skin exam shows a ruddy complexion.  Lab Results  Component Value Date   WBC 8.8 02/03/2014   HGB 16.9 02/03/2014   HCT 48.8 02/03/2014   MCV 90 02/03/2014   PLT 258 02/03/2014     Chemistry      Component Value Date/Time   NA 141 02/11/2013 1158   K 4.2 02/11/2013 1158   CL 107 02/11/2013 1158   CO2 26 02/11/2013 1158   BUN 22 02/11/2013 1158   CREATININE 1.2 02/11/2013 1158      Component Value Date/Time   CALCIUM 9.8 02/11/2013 1158         Impression and Plan: Mr. Robert Tran is 73 year old gentleman with polycythemia. He's doing well. He will be phlebotomized. Again, we have to try to keep his hematocrit below 45%.  I will plan to get back to see [place] in another couple months.  He wanted to go every 3 months so that he has been what sound out of the ocean. However, in 2 months would  be appropriate.   Volanda Napoleon, MD 7/22/201511:33 AM

## 2014-04-21 ENCOUNTER — Ambulatory Visit (HOSPITAL_BASED_OUTPATIENT_CLINIC_OR_DEPARTMENT_OTHER): Payer: Medicare Other

## 2014-04-21 ENCOUNTER — Ambulatory Visit (HOSPITAL_BASED_OUTPATIENT_CLINIC_OR_DEPARTMENT_OTHER): Payer: Medicare Other | Admitting: Family

## 2014-04-21 ENCOUNTER — Other Ambulatory Visit (HOSPITAL_BASED_OUTPATIENT_CLINIC_OR_DEPARTMENT_OTHER): Payer: Medicare Other | Admitting: Lab

## 2014-04-21 VITALS — BP 149/80 | HR 80 | Temp 97.7°F | Resp 16 | Wt 177.0 lb

## 2014-04-21 VITALS — BP 131/87 | HR 85 | Resp 18

## 2014-04-21 DIAGNOSIS — D45 Polycythemia vera: Secondary | ICD-10-CM

## 2014-04-21 DIAGNOSIS — D751 Secondary polycythemia: Secondary | ICD-10-CM | POA: Diagnosis not present

## 2014-04-21 LAB — CBC WITH DIFFERENTIAL (CANCER CENTER ONLY)
BASO#: 0.1 10*3/uL (ref 0.0–0.2)
BASO%: 0.9 % (ref 0.0–2.0)
EOS ABS: 0.2 10*3/uL (ref 0.0–0.5)
EOS%: 2.4 % (ref 0.0–7.0)
HCT: 48.2 % (ref 38.7–49.9)
HGB: 16.6 g/dL (ref 13.0–17.1)
LYMPH#: 2.9 10*3/uL (ref 0.9–3.3)
LYMPH%: 31.2 % (ref 14.0–48.0)
MCH: 31.3 pg (ref 28.0–33.4)
MCHC: 34.4 g/dL (ref 32.0–35.9)
MCV: 91 fL (ref 82–98)
MONO#: 0.7 10*3/uL (ref 0.1–0.9)
MONO%: 7.3 % (ref 0.0–13.0)
NEUT%: 58.2 % (ref 40.0–80.0)
NEUTROS ABS: 5.4 10*3/uL (ref 1.5–6.5)
Platelets: 251 10*3/uL (ref 145–400)
RBC: 5.3 10*6/uL (ref 4.20–5.70)
RDW: 14 % (ref 11.1–15.7)
WBC: 9.2 10*3/uL (ref 4.0–10.0)

## 2014-04-21 LAB — CMP (CANCER CENTER ONLY)
ALK PHOS: 49 U/L (ref 26–84)
ALT(SGPT): 21 U/L (ref 10–47)
AST: 15 U/L (ref 11–38)
Albumin: 3.4 g/dL (ref 3.3–5.5)
BILIRUBIN TOTAL: 0.6 mg/dL (ref 0.20–1.60)
BUN, Bld: 15 mg/dL (ref 7–22)
CO2: 23 mEq/L (ref 18–33)
Calcium: 9.5 mg/dL (ref 8.0–10.3)
Chloride: 102 mEq/L (ref 98–108)
Creat: 0.8 mg/dl (ref 0.6–1.2)
GLUCOSE: 217 mg/dL — AB (ref 73–118)
Potassium: 3.6 mEq/L (ref 3.3–4.7)
Sodium: 138 mEq/L (ref 128–145)
TOTAL PROTEIN: 6.3 g/dL — AB (ref 6.4–8.1)

## 2014-04-21 LAB — IRON AND TIBC CHCC
%SAT: 34 % (ref 20–55)
Iron: 111 ug/dL (ref 42–163)
TIBC: 328 ug/dL (ref 202–409)
UIBC: 216 ug/dL (ref 117–376)

## 2014-04-21 LAB — FERRITIN CHCC: Ferritin: 19 ng/ml — ABNORMAL LOW (ref 22–316)

## 2014-04-21 NOTE — Progress Notes (Signed)
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 Robert Tran is here today for a follow-up. He is feeling tired and SOB. His Hct today is 48.2. His last phlebotomy was in July. He had some back and hip pain afterwards. He feels better now. He denies fever, chills, n/v, cough, rash, headache, dizziness, chest pain, palpitations, abdominal pain, constipation, diarrhea, blood in urine or stool. No swelling, tenderness, numbness or tingling in his extremities. No bleeding with the Plavix. He is still smoking. He probably smokes about a pack a day from what he tells. He is going to try and cut back. His appetite is good and he is staying hydrated. His ferritin in July was 17.   CURRENT TREATMENT: Phlebotomy to maintain hematocrit less than 45%  Plavix 75 mg by mouth daily  REVIEW OF SYSTEMS: All other 10 point review of systems is negative.   PAST MEDICAL HISTORY: No past medical history on file.  PAST SURGICAL HISTORY: No past surgical history on file.  FAMILY HISTORY No family history on file.  GYNECOLOGIC HISTORY:  No LMP for male patient.   SOCIAL HISTORY:  History   Social History  . Marital Status: Married    Spouse Name: N/A    Number of Children: N/A  . Years of Education: N/A   Occupational History  . Not on file.   Social History Main Topics  . Smoking status: Current Every Day Smoker -- 1.00 packs/day for 63 years    Types: Cigarettes    Start date: 07/06/1941  . Smokeless tobacco: Never Used     Comment: still smoking 7-22--15  . Alcohol Use: Not on file  . Drug Use: Not on file  . Sexual Activity: Not on file   Other Topics Concern  . Not on file   Social History Narrative  . No narrative on file   ADVANCED DIRECTIVES: <no information>    HEALTH MAINTENANCE: History  Substance Use Topics  . Smoking status: Current Every Day Smoker -- 1.00 packs/day for 63 years    Types: Cigarettes    Start date: 07/06/1941  . Smokeless tobacco: Never Used     Comment: still smoking 7-22--15  . Alcohol Use: Not on file   Colonoscopy: PAP: Bone density: Lipid panel:  Allergies  Allergen Reactions  . Sulfa Drugs Cross Reactors     Dizziness, nausea    Current Outpatient Prescriptions  Medication Sig Dispense Refill  . Cholecalciferol (VITAMIN D) 2000 UNITS tablet Take 2,000 Units by mouth daily.      . clopidogrel (PLAVIX) 75 MG tablet Take 75 mg by mouth daily.      . Folic Acid 169 MCG TABS Take 400 mcg by mouth every morning.      . loxapine (LOXITANE) 10 MG capsule Take 10 mg by mouth daily.      . ramipril (ALTACE) 10 MG tablet Take 10 mg by mouth daily.      Marland Kitchen torsemide (DEMADEX) 20 MG tablet Take 20 mg by mouth as needed.       No current facility-administered medications for this visit.   OBJECTIVE: Filed Vitals:   04/21/14 1214  BP: 149/80  Pulse: 80  Temp: 97.7 F (36.5 C)  Resp: 16   Body mass index is 27.72 kg/(m^2). ECOG FS:1 - Symptomatic but completely ambulatory  Ocular: Sclerae unicteric, pupils equal, round and reactive to light Ear-nose-throat: Oropharynx clear, dentition fair Lymphatic: No cervical or supraclavicular adenopathy Lungs no rales or rhonchi, good excursion bilaterally Heart regular rate and rhythm, no murmur appreciated Abd soft, nontender, positive bowel sounds MSK no focal spinal tenderness, no joint edema Neuro: non-focal, well-oriented, appropriate affect  LAB RESULTS: CMP     Component Value Date/Time   NA 138 04/21/2014 1157   K 3.6 04/21/2014 1157   CL 102 04/21/2014 1157   CO2 23 04/21/2014 1157   GLUCOSE 217* 04/21/2014 1157   BUN 15 04/21/2014 1157   CREATININE 0.8 04/21/2014 1157   CALCIUM 9.5 04/21/2014 1157   PROT 6.3* 04/21/2014 1157   AST 15 04/21/2014 1157   ALT 21  04/21/2014 1157   ALKPHOS 49 04/21/2014 1157   BILITOT 0.60 04/21/2014 1157   No results found for this basename: SPEP, UPEP,  kappa and lambda light chains   Lab Results  Component Value Date   WBC 9.2 04/21/2014   NEUTROABS 5.4 04/21/2014   HGB 16.6 04/21/2014   HCT 48.2 04/21/2014   MCV 91 04/21/2014   PLT 251 04/21/2014   No results found for this basename: LABCA2   No components found with this basename: LABCA125   No results found for this basename: INR,  in the last 168 hours Urinalysis No results found for this basename: colorurine, appearanceur, labspec, phurine, glucoseu, hgbur, bilirubinur, ketonesur, proteinur, urobilinogen, nitrite, leukocytesur   STUDIES: No results found.  ASSESSMENT/PLAN: Robert Tran is 73 year old gentleman with polycythemia. He is having some fatigue and SOB. His Hct today is 48.2. We will phlebotomize him today.  We will wait and see what his iron studies show.  We will see him back in 4 months for labs and follow-up.  He knows to call here with any questions or concerns and to go tot he ED in the event of an emergency. We can certainly see him sooner if need be.   Eliezer Bottom, NP 04/21/2014 1:11 PM

## 2014-04-21 NOTE — Progress Notes (Signed)
Robert Tran presents today for phlebotomy per MD orders. Phlebotomy procedure started at 1250 and ended at 1300. 500 ml removed. Patient observed for 30 minutes after procedure without any incident. Patient tolerated procedure well. IV needle removed intact.

## 2014-04-21 NOTE — Patient Instructions (Signed)

## 2014-08-18 ENCOUNTER — Ambulatory Visit (HOSPITAL_BASED_OUTPATIENT_CLINIC_OR_DEPARTMENT_OTHER): Payer: Medicare Other

## 2014-08-18 ENCOUNTER — Other Ambulatory Visit (HOSPITAL_BASED_OUTPATIENT_CLINIC_OR_DEPARTMENT_OTHER): Payer: Medicare Other | Admitting: Lab

## 2014-08-18 ENCOUNTER — Ambulatory Visit (HOSPITAL_BASED_OUTPATIENT_CLINIC_OR_DEPARTMENT_OTHER): Payer: Medicare Other | Admitting: Hematology & Oncology

## 2014-08-18 ENCOUNTER — Encounter: Payer: Self-pay | Admitting: Hematology & Oncology

## 2014-08-18 VITALS — BP 144/78 | HR 83 | Temp 97.7°F | Resp 20 | Ht 67.0 in | Wt 178.0 lb

## 2014-08-18 VITALS — BP 157/98 | HR 73 | Resp 20

## 2014-08-18 DIAGNOSIS — D45 Polycythemia vera: Secondary | ICD-10-CM

## 2014-08-18 DIAGNOSIS — D751 Secondary polycythemia: Secondary | ICD-10-CM

## 2014-08-18 LAB — CBC WITH DIFFERENTIAL (CANCER CENTER ONLY)
BASO#: 0.1 10*3/uL (ref 0.0–0.2)
BASO%: 0.8 % (ref 0.0–2.0)
EOS%: 3.2 % (ref 0.0–7.0)
Eosinophils Absolute: 0.3 10*3/uL (ref 0.0–0.5)
HCT: 50 % — ABNORMAL HIGH (ref 38.7–49.9)
HEMOGLOBIN: 17 g/dL (ref 13.0–17.1)
LYMPH#: 3 10*3/uL (ref 0.9–3.3)
LYMPH%: 33.9 % (ref 14.0–48.0)
MCH: 30.7 pg (ref 28.0–33.4)
MCHC: 34 g/dL (ref 32.0–35.9)
MCV: 90 fL (ref 82–98)
MONO#: 0.6 10*3/uL (ref 0.1–0.9)
MONO%: 7.4 % (ref 0.0–13.0)
NEUT#: 4.8 10*3/uL (ref 1.5–6.5)
NEUT%: 54.7 % (ref 40.0–80.0)
PLATELETS: 244 10*3/uL (ref 145–400)
RBC: 5.53 10*6/uL (ref 4.20–5.70)
RDW: 13.6 % (ref 11.1–15.7)
WBC: 8.7 10*3/uL (ref 4.0–10.0)

## 2014-08-18 LAB — COMPREHENSIVE METABOLIC PANEL
ALBUMIN: 4.1 g/dL (ref 3.5–5.2)
ALT: 22 U/L (ref 0–53)
AST: 15 U/L (ref 0–37)
Alkaline Phosphatase: 63 U/L (ref 39–117)
BUN: 16 mg/dL (ref 6–23)
CO2: 20 meq/L (ref 19–32)
CREATININE: 1.03 mg/dL (ref 0.50–1.35)
Calcium: 9.6 mg/dL (ref 8.4–10.5)
Chloride: 106 mEq/L (ref 96–112)
GLUCOSE: 168 mg/dL — AB (ref 70–99)
POTASSIUM: 4 meq/L (ref 3.5–5.3)
Sodium: 138 mEq/L (ref 135–145)
TOTAL PROTEIN: 6.7 g/dL (ref 6.0–8.3)
Total Bilirubin: 0.3 mg/dL (ref 0.2–1.2)

## 2014-08-18 NOTE — Progress Notes (Signed)
Robert Tran presents today for phlebotomy per MD orders. Phlebotomy procedure started at 1245 and ended at 1258. 500 ml removed. Patient observed for 30 minutes after procedure without any incident. Patient tolerated procedure well. IV needle removed intact.

## 2014-08-18 NOTE — Progress Notes (Signed)
Hematology and Oncology Follow Up Visit  Robert Tran [id number] 07-03-1941 74 y.o. 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 is back for followup. We last saw him back in October. He was phlebotomize back then.  He's been doing well. He's had no headache. He's had no weakness. He's had no nausea or vomiting.  He's cut back on cigarettes. He's cutting back on tobacco use overall. He's cutting back on coffee.  He's had no rashes. He's had no leg swelling. He says that his bruising has gotten a lot better. He is on Plavix.    Medications:  Current outpatient prescriptions:  .  Cholecalciferol (VITAMIN D) 2000 UNITS tablet, Take 2,000 Units by mouth daily. 08-18-14  Now 4,000 IU, Disp: , Rfl:  .  clopidogrel (PLAVIX) 75 MG tablet, Take 75 mg by mouth daily., Disp: , Rfl:  .  Folic Acid 315 MCG TABS, Take 400 mcg by mouth every morning., Disp: , Rfl:  .  loxapine (LOXITANE) 10 MG capsule, Take 10 mg by mouth daily., Disp: , Rfl:  .  ramipril (ALTACE) 10 MG tablet, Take 10 mg by mouth daily., Disp: , Rfl:  .  torsemide (DEMADEX) 20 MG tablet, Take 20 mg by mouth as needed., Disp: , Rfl:   Allergies:  Allergies  Allergen Reactions  . Sulfa Drugs Cross Reactors     Dizziness, nausea    Past Medical History, Surgical history, Social history, and Family History were reviewed and updated.  Review of Systems: As above  Physical Exam:  height is 5\' 7"  (1.702 m) and weight is 178 lb (80.74 kg). His oral temperature is 97.7 F (36.5 C). His blood pressure is 144/78 and his pulse is 83. His respiration is 20.   We'll go and well-nourished gentleman in no obvious distress. Head and neck exam shows no ocular or oral lesions. He has no scleral icterus. There is no conjunctival inflammation. Neck is supple with no adenopathy. Lungs are  clear. Cardiac exam regular rate and rhythm with no murmurs rubs or bruits. Abdomen is soft. He has good bowel sounds. There is no fluid wave. There is no palpable liver or spleen tip. Back exam shows no tenderness over the spine ribs or hips. Extremities shows no clubbing cyanosis or edema. Neurological exam shows no focal neurological deficits. Skin exam shows a ruddy complexion.  Lab Results  Component Value Date   WBC 8.7 08/18/2014   HGB 17.0 08/18/2014   HCT 50.0* 08/18/2014   MCV 90 08/18/2014   PLT 244 08/18/2014     Chemistry      Component Value Date/Time   NA 138 04/21/2014 1157   K 3.6 04/21/2014 1157   CL 102 04/21/2014 1157   CO2 23 04/21/2014 1157   BUN 15 04/21/2014 1157   CREATININE 0.8 04/21/2014 1157      Component Value Date/Time   CALCIUM 9.5 04/21/2014 1157   ALKPHOS 49 04/21/2014 1157   AST 15 04/21/2014 1157   ALT 21 04/21/2014 1157   BILITOT 0.60 04/21/2014 1157         Impression and Plan: Robert Tran is 74 year old gentleman with polycythemia. He's doing well. He will be phlebotomized. Again, we have to try to keep his hematocrit below 45%.  I will plan to get back to see [place]  in another 3 months. I want to see him back before Memorial Day weekend. That way, I think we probably will phlebotomize him and then try to get him through the summertime. He basically spends the summer at their beach house.   Volanda Napoleon, MD 2/3/201612:41 PM

## 2014-08-19 LAB — IRON AND TIBC CHCC
%SAT: 23 % (ref 20–55)
Iron: 79 ug/dL (ref 42–163)
TIBC: 341 ug/dL (ref 202–409)
UIBC: 262 ug/dL (ref 117–376)

## 2014-08-19 LAB — FERRITIN CHCC: Ferritin: 21 ng/ml — ABNORMAL LOW (ref 22–316)

## 2014-12-06 ENCOUNTER — Ambulatory Visit (HOSPITAL_BASED_OUTPATIENT_CLINIC_OR_DEPARTMENT_OTHER): Payer: Medicare Other

## 2014-12-06 ENCOUNTER — Encounter: Payer: Self-pay | Admitting: Hematology & Oncology

## 2014-12-06 ENCOUNTER — Other Ambulatory Visit (HOSPITAL_BASED_OUTPATIENT_CLINIC_OR_DEPARTMENT_OTHER): Payer: Medicare Other

## 2014-12-06 ENCOUNTER — Ambulatory Visit (HOSPITAL_BASED_OUTPATIENT_CLINIC_OR_DEPARTMENT_OTHER): Payer: Medicare Other | Admitting: Hematology & Oncology

## 2014-12-06 VITALS — BP 144/83 | HR 78 | Temp 98.1°F | Resp 16

## 2014-12-06 VITALS — BP 131/81 | HR 79 | Temp 97.5°F | Resp 20 | Ht 67.0 in | Wt 178.0 lb

## 2014-12-06 DIAGNOSIS — Z8673 Personal history of transient ischemic attack (TIA), and cerebral infarction without residual deficits: Secondary | ICD-10-CM | POA: Diagnosis not present

## 2014-12-06 DIAGNOSIS — Z7901 Long term (current) use of anticoagulants: Secondary | ICD-10-CM | POA: Diagnosis not present

## 2014-12-06 DIAGNOSIS — Z720 Tobacco use: Secondary | ICD-10-CM | POA: Diagnosis not present

## 2014-12-06 DIAGNOSIS — D45 Polycythemia vera: Secondary | ICD-10-CM | POA: Diagnosis present

## 2014-12-06 LAB — CBC WITH DIFFERENTIAL (CANCER CENTER ONLY)
BASO#: 0.1 10*3/uL (ref 0.0–0.2)
BASO%: 0.7 % (ref 0.0–2.0)
EOS%: 2.7 % (ref 0.0–7.0)
Eosinophils Absolute: 0.2 10*3/uL (ref 0.0–0.5)
HEMATOCRIT: 49.3 % (ref 38.7–49.9)
HEMOGLOBIN: 16.9 g/dL (ref 13.0–17.1)
LYMPH#: 2.8 10*3/uL (ref 0.9–3.3)
LYMPH%: 32 % (ref 14.0–48.0)
MCH: 31 pg (ref 28.0–33.4)
MCHC: 34.3 g/dL (ref 32.0–35.9)
MCV: 91 fL (ref 82–98)
MONO#: 0.7 10*3/uL (ref 0.1–0.9)
MONO%: 8.3 % (ref 0.0–13.0)
NEUT%: 56.3 % (ref 40.0–80.0)
NEUTROS ABS: 4.8 10*3/uL (ref 1.5–6.5)
PLATELETS: 248 10*3/uL (ref 145–400)
RBC: 5.45 10*6/uL (ref 4.20–5.70)
RDW: 13.7 % (ref 11.1–15.7)
WBC: 8.6 10*3/uL (ref 4.0–10.0)

## 2014-12-06 NOTE — Progress Notes (Signed)
Hematology and Oncology Follow Up Visit  Robert Tran [id number] May 03, 1941 74 y.o. 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 is back for followup. He is doing okay. He is still working. His wife is working. They go to the beach quite a bit. They have a beach house.  He has had no problems with headache. There's been no issues with stroke. He still is on Plavix.  He's had no nausea vomiting. He's had no burning in the hands or feet.  He's had no change in bowel or bladder habits.  He's had no cough. He still smokes a pack a day of cigarettes. He still drinks a lot of coffee.  His last iron studies back in February shows ferritin to be only 21 with an iron saturation of 23%.  Overall, his performance status is ECOG 1.   Medications:  Current outpatient prescriptions:  .  Cholecalciferol (VITAMIN D) 2000 UNITS tablet, Take 2,000 Units by mouth daily. 08-18-14  Now 4,000 IU, Disp: , Rfl:  .  clopidogrel (PLAVIX) 75 MG tablet, Take 75 mg by mouth daily., Disp: , Rfl:  .  Folic Acid 409 MCG TABS, Take 400 mcg by mouth every morning., Disp: , Rfl:  .  loxapine (LOXITANE) 10 MG capsule, Take 10 mg by mouth daily., Disp: , Rfl:  .  ramipril (ALTACE) 10 MG tablet, Take 10 mg by mouth daily., Disp: , Rfl:  .  torsemide (DEMADEX) 20 MG tablet, Take 20 mg by mouth as needed., Disp: , Rfl:   Allergies:  Allergies  Allergen Reactions  . Sulfa Drugs Cross Reactors     Dizziness, nausea    Past Medical History, Surgical history, Social history, and Family History were reviewed and updated.  Review of Systems: As above  Physical Exam:  height is '5\' 7"'$  (1.702 m) and weight is 178 lb (80.74 kg). His oral temperature is 97.5 F (36.4 C). His blood pressure is 131/81 and his pulse is 79. His respiration is 20.   We'll go and  well-nourished gentleman in no obvious distress. Head and neck exam shows no ocular or oral lesions. He has no scleral icterus. There is no conjunctival inflammation. Neck is supple with no adenopathy. Lungs are clear. Cardiac exam regular rate and rhythm with no murmurs rubs or bruits. Abdomen is soft. He has good bowel sounds. There is no fluid wave. There is no palpable liver or spleen tip. Back exam shows no tenderness over the spine ribs or hips. Extremities shows no clubbing cyanosis or edema. Neurological exam shows no focal neurological deficits. Skin exam shows a ruddy complexion.  Lab Results  Component Value Date   WBC 8.6 12/06/2014   HGB 16.9 12/06/2014   HCT 49.3 12/06/2014   MCV 91 12/06/2014   PLT 248 12/06/2014     Chemistry      Component Value Date/Time   NA 138 08/18/2014 1147   NA 138 04/21/2014 1157   K 4.0 08/18/2014 1147   K 3.6 04/21/2014 1157   CL 106 08/18/2014 1147   CL 102 04/21/2014 1157   CO2 20 08/18/2014 1147   CO2 23 04/21/2014 1157   BUN 16 08/18/2014 1147   BUN 15 04/21/2014 1157   CREATININE 1.03 08/18/2014 1147   CREATININE 0.8 04/21/2014 1157  Component Value Date/Time   CALCIUM 9.6 08/18/2014 1147   CALCIUM 9.5 04/21/2014 1157   ALKPHOS 63 08/18/2014 1147   ALKPHOS 49 04/21/2014 1157   AST 15 08/18/2014 1147   AST 15 04/21/2014 1157   ALT 22 08/18/2014 1147   ALT 21 04/21/2014 1157   BILITOT 0.3 08/18/2014 1147   BILITOT 0.60 04/21/2014 1157         Impression and Plan: Robert Tran is 74 year old gentleman with polycythemia. He's doing well. He will be phlebotomized. Again, we have to try to keep his hematocrit below 45%. He really does want to keep his blood below 50%. I told him as long as he is smoking, we have to be aggressive and keep his hematocrit below 45%.  I will plan to get back to see him in another 3 months. I want to get him through the summertime. He does wife are very busy traveling and working and I don't want to  interfere with their summer plans.  I told him to just drink more water. He drinks a lot of coffee. He still smokes a pack a day.  Volanda Napoleon, MD 5/23/201612:55 PM

## 2014-12-06 NOTE — Patient Instructions (Signed)

## 2015-03-08 ENCOUNTER — Other Ambulatory Visit (HOSPITAL_BASED_OUTPATIENT_CLINIC_OR_DEPARTMENT_OTHER): Payer: Medicare Other

## 2015-03-08 ENCOUNTER — Ambulatory Visit (HOSPITAL_BASED_OUTPATIENT_CLINIC_OR_DEPARTMENT_OTHER): Payer: Medicare Other | Admitting: Hematology & Oncology

## 2015-03-08 ENCOUNTER — Ambulatory Visit (HOSPITAL_BASED_OUTPATIENT_CLINIC_OR_DEPARTMENT_OTHER): Payer: Medicare Other

## 2015-03-08 VITALS — BP 124/77 | HR 80 | Temp 98.2°F | Resp 18

## 2015-03-08 VITALS — BP 136/72 | HR 89 | Temp 97.5°F | Resp 16 | Ht 67.0 in | Wt 177.0 lb

## 2015-03-08 DIAGNOSIS — D45 Polycythemia vera: Secondary | ICD-10-CM

## 2015-03-08 LAB — CBC WITH DIFFERENTIAL (CANCER CENTER ONLY)
BASO#: 0.1 10*3/uL (ref 0.0–0.2)
BASO%: 1 % (ref 0.0–2.0)
EOS%: 3.1 % (ref 0.0–7.0)
Eosinophils Absolute: 0.3 10*3/uL (ref 0.0–0.5)
HEMATOCRIT: 47.7 % (ref 38.7–49.9)
HGB: 16.4 g/dL (ref 13.0–17.1)
LYMPH#: 2.1 10*3/uL (ref 0.9–3.3)
LYMPH%: 26 % (ref 14.0–48.0)
MCH: 31.2 pg (ref 28.0–33.4)
MCHC: 34.4 g/dL (ref 32.0–35.9)
MCV: 91 fL (ref 82–98)
MONO#: 0.5 10*3/uL (ref 0.1–0.9)
MONO%: 5.7 % (ref 0.0–13.0)
NEUT#: 5.2 10*3/uL (ref 1.5–6.5)
NEUT%: 64.2 % (ref 40.0–80.0)
Platelets: 234 10*3/uL (ref 145–400)
RBC: 5.25 10*6/uL (ref 4.20–5.70)
RDW: 13.8 % (ref 11.1–15.7)
WBC: 8 10*3/uL (ref 4.0–10.0)

## 2015-03-08 LAB — IRON AND TIBC CHCC
%SAT: 33 % (ref 20–55)
Iron: 104 ug/dL (ref 42–163)
TIBC: 311 ug/dL (ref 202–409)
UIBC: 207 ug/dL (ref 117–376)

## 2015-03-08 LAB — COMPREHENSIVE METABOLIC PANEL (CC13)
ALT: 20 U/L (ref 0–55)
AST: 13 U/L (ref 5–34)
Albumin: 3.5 g/dL (ref 3.5–5.0)
Alkaline Phosphatase: 72 U/L (ref 40–150)
Anion Gap: 10 meq/L (ref 3–11)
BUN: 16.6 mg/dL (ref 7.0–26.0)
CO2: 21 meq/L — ABNORMAL LOW (ref 22–29)
Calcium: 9.4 mg/dL (ref 8.4–10.4)
Chloride: 110 meq/L — ABNORMAL HIGH (ref 98–109)
Creatinine: 1.1 mg/dL (ref 0.7–1.3)
EGFR: 64 ml/min/1.73 m2 — ABNORMAL LOW
Glucose: 288 mg/dL — ABNORMAL HIGH (ref 70–140)
Potassium: 3.9 meq/L (ref 3.5–5.1)
Sodium: 141 meq/L (ref 136–145)
Total Bilirubin: 0.31 mg/dL (ref 0.20–1.20)
Total Protein: 6.1 g/dL — ABNORMAL LOW (ref 6.4–8.3)

## 2015-03-08 LAB — FERRITIN CHCC: Ferritin: 17 ng/mL — ABNORMAL LOW (ref 22–316)

## 2015-03-08 NOTE — Progress Notes (Signed)
Hematology and Oncology Follow Up Visit  Robert Tran [id number] 1940/08/09 74 y.o. 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 is back for followup. He is doing okay. He is still working. His wife is working. They go to the beach quite a bit. They have a beach house. They have been down to the beach house most of the summer. Unfortunately, they just got back from the beach a. They had to institute anti-flea measures. Apparently grandson brought in a dog that was flea infested. They did the anti-flea measures the natural way with baking soda and apple cider vinegar.  He is cut back on his tobacco use. He's cut back on his coffee drinking.  He's had no issues with bleeding. There's been no change in bowel or bladder habits.  He has had no problems with headache. There's been no issues with stroke. He still is on Plavix.   Overall, his performance status is ECOG 1.   Medications:  Current outpatient prescriptions:  .  Cholecalciferol (VITAMIN D) 2000 UNITS tablet, Take 2,000 Units by mouth daily. 03/08/15 taking 5000 u, Disp: , Rfl:  .  clopidogrel (PLAVIX) 75 MG tablet, Take 75 mg by mouth daily., Disp: , Rfl:  .  Folic Acid 101 MCG TABS, Take 400 mcg by mouth every morning., Disp: , Rfl:  .  loxapine (LOXITANE) 10 MG capsule, Take 10 mg by mouth daily., Disp: , Rfl:  .  ramipril (ALTACE) 10 MG tablet, Take 10 mg by mouth daily., Disp: , Rfl:  .  torsemide (DEMADEX) 20 MG tablet, Take 20 mg by mouth as needed., Disp: , Rfl:   Allergies:  Allergies  Allergen Reactions  . Sulfa Drugs Cross Reactors     Dizziness, nausea    Past Medical History, Surgical history, Social history, and Family History were reviewed and updated.  Review of Systems: As above  Physical Exam:  height is '5\' 7"'$  (1.702 m) and weight is 177 lb (80.287  kg). His oral temperature is 97.5 F (36.4 C). His blood pressure is 136/72 and his pulse is 89. His respiration is 16.   Well-developed and well-nourished gentleman in no obvious distress. Head and neck exam shows no ocular or oral lesions. He has no scleral icterus. There is no conjunctival inflammation. Neck is supple with no adenopathy. Lungs are clear. Cardiac exam regular rate and rhythm with no murmurs rubs or bruits. Abdomen is soft. He has good bowel sounds. There is no fluid wave. There is no palpable liver or spleen tip. Back exam shows no tenderness over the spine ribs or hips. Extremities shows no clubbing cyanosis or edema. Neurological exam shows no focal neurological deficits. Skin exam shows a ruddy complexion.  Lab Results  Component Value Date   WBC 8.0 03/08/2015   HGB 16.4 03/08/2015   HCT 47.7 03/08/2015   MCV 91 03/08/2015   PLT 234 03/08/2015     Chemistry      Component Value Date/Time   NA 138 08/18/2014 1147   NA 138 04/21/2014 1157   K 4.0 08/18/2014 1147   K 3.6 04/21/2014 1157   CL 106 08/18/2014 1147   CL 102 04/21/2014 1157   CO2 20 08/18/2014 1147   CO2 23 04/21/2014 1157   BUN 16 08/18/2014 1147   BUN 15 04/21/2014  1157   CREATININE 1.03 08/18/2014 1147   CREATININE 0.8 04/21/2014 1157      Component Value Date/Time   CALCIUM 9.6 08/18/2014 1147   CALCIUM 9.5 04/21/2014 1157   ALKPHOS 63 08/18/2014 1147   ALKPHOS 49 04/21/2014 1157   AST 15 08/18/2014 1147   AST 15 04/21/2014 1157   ALT 22 08/18/2014 1147   ALT 21 04/21/2014 1157   BILITOT 0.3 08/18/2014 1147   BILITOT 0.60 04/21/2014 1157         Impression and Plan: Robert Tran is 74 year old gentleman with polycythemia. He's doing well. He will be phlebotomized. Again, we have to try to keep his hematocrit below 45%. He really does want to keep his blood below 50%. I told him as long as he is smoking, we have to be aggressive and keep his hematocrit below 45%.  I will plan to get back  to see him in another 3 months.   I told him to just drink more water. He is now only drinking 3 cups a day of coffee. This is much better.   He still smokes but is down to less than 1 pack per day  . Volanda Napoleon, MD 8/23/201611:39 AM

## 2015-03-08 NOTE — Patient Instructions (Signed)

## 2015-06-08 ENCOUNTER — Ambulatory Visit: Payer: Medicare Other | Admitting: Hematology & Oncology

## 2015-06-08 ENCOUNTER — Other Ambulatory Visit: Payer: Medicare Other

## 2015-06-20 ENCOUNTER — Ambulatory Visit (HOSPITAL_BASED_OUTPATIENT_CLINIC_OR_DEPARTMENT_OTHER): Payer: Medicare Other | Admitting: Hematology & Oncology

## 2015-06-20 ENCOUNTER — Ambulatory Visit (HOSPITAL_BASED_OUTPATIENT_CLINIC_OR_DEPARTMENT_OTHER): Payer: Medicare Other

## 2015-06-20 ENCOUNTER — Encounter: Payer: Self-pay | Admitting: Hematology & Oncology

## 2015-06-20 ENCOUNTER — Other Ambulatory Visit (HOSPITAL_BASED_OUTPATIENT_CLINIC_OR_DEPARTMENT_OTHER): Payer: Medicare Other

## 2015-06-20 VITALS — BP 143/82 | HR 86

## 2015-06-20 VITALS — BP 125/72 | HR 97 | Temp 97.9°F | Resp 20 | Ht 67.0 in | Wt 177.0 lb

## 2015-06-20 DIAGNOSIS — D45 Polycythemia vera: Secondary | ICD-10-CM

## 2015-06-20 LAB — CBC WITH DIFFERENTIAL (CANCER CENTER ONLY)
BASO#: 0.1 10*3/uL (ref 0.0–0.2)
BASO%: 0.7 % (ref 0.0–2.0)
EOS%: 2 % (ref 0.0–7.0)
Eosinophils Absolute: 0.2 10*3/uL (ref 0.0–0.5)
HCT: 49.6 % (ref 38.7–49.9)
HGB: 17 g/dL (ref 13.0–17.1)
LYMPH#: 3.1 10*3/uL (ref 0.9–3.3)
LYMPH%: 32.5 % (ref 14.0–48.0)
MCH: 30.7 pg (ref 28.0–33.4)
MCHC: 34.3 g/dL (ref 32.0–35.9)
MCV: 90 fL (ref 82–98)
MONO#: 0.6 10*3/uL (ref 0.1–0.9)
MONO%: 6.3 % (ref 0.0–13.0)
NEUT#: 5.5 10*3/uL (ref 1.5–6.5)
NEUT%: 58.5 % (ref 40.0–80.0)
PLATELETS: 253 10*3/uL (ref 145–400)
RBC: 5.53 10*6/uL (ref 4.20–5.70)
RDW: 13.6 % (ref 11.1–15.7)
WBC: 9.4 10*3/uL (ref 4.0–10.0)

## 2015-06-20 LAB — RETICULOCYTES
ABS Retic: 77.3 10*3/uL (ref 19.0–186.0)
RBC.: 5.52 MIL/uL (ref 4.22–5.81)
Retic Ct Pct: 1.4 % (ref 0.4–2.3)

## 2015-06-20 NOTE — Progress Notes (Signed)
Robert Tran presents today for phlebotomy per MD orders. Phlebotomy procedure started at 1510 and ended at 1530. 500 ml grams removed. Patient observed for 30 minutes after procedure without any incident. Patient tolerated procedure well. IV needle removed intact.

## 2015-06-20 NOTE — Patient Instructions (Signed)
Therapeutic Phlebotomy, Care After  Refer to this sheet in the next few weeks. These instructions provide you with information about caring for yourself after your procedure. Your health care provider may also give you more specific instructions. Your treatment has been planned according to current medical practices, but problems sometimes occur. Call your health care provider if you have any problems or questions after your procedure.  WHAT TO EXPECT AFTER THE PROCEDURE  After your procedure, it is common to have:   Light-headedness or dizziness. You may feel faint.   Nausea.   Tiredness.  HOME CARE INSTRUCTIONS  Activities   Return to your normal activities as directed by your health care provider. Most people can go back to their normal activities right away.   Avoid strenuous physical activity and heavy lifting or pulling for about 5 hours after the procedure. Do not lift anything that is heavier than 10 lb (4.5 kg).   Athletes should avoid strenuous exercise for at least 12 hours.   Change positions slowly for the remainder of the day. This will help to prevent light-headedness or fainting.   If you feel light-headed, lie down until the feeling goes away.  Eating and Drinking   Be sure to eat well-balanced meals for the next 24 hours.   Drink enough fluid to keep your urine clear or pale yellow.   Avoid drinking alcohol on the day that you had the procedure.  Care of the Needle Insertion Site   Keep your bandage dry. You can remove the bandage after about 5 hours or as directed by your health care provider.   If you have bleeding from the needle insertion site, elevate your arm and press firmly on the site until the bleeding stops.   If you have bruising at the site, apply ice to the area:   Put ice in a plastic bag.   Place a towel between your skin and the bag.   Leave the ice on for 20 minutes, 2-3 times a day for the first 24 hours.   If the swelling does not go away after 24 hours, apply  a warm, moist washcloth to the area for 20 minutes, 2-3 times a day.  General Instructions   Avoid smoking for at least 30 minutes after the procedure.   Keep all follow-up visits as directed by your health care provider. It is important to continue with further therapeutic phlebotomy treatments as directed.  SEEK MEDICAL CARE IF:   You have redness, swelling, or pain at the needle insertion site.   You have fluid, blood, or pus coming from the needle insertion site.   You feel light-headed, dizzy, or nauseated, and the feeling does not go away.   You notice new bruising at the needle insertion site.   You feel weaker than normal.   You have a fever or chills.  SEEK IMMEDIATE MEDICAL CARE IF:   You have severe nausea or vomiting.   You have chest pain.   You have trouble breathing.    This information is not intended to replace advice given to you by your health care provider. Make sure you discuss any questions you have with your health care provider.    Document Released: 12/04/2010 Document Revised: 11/16/2014 Document Reviewed: 06/28/2014  Elsevier Interactive Patient Education 2016 [employer]

## 2015-06-20 NOTE — Progress Notes (Signed)
Hematology and Oncology Follow Up Visit  Robert Tran [id number] [birth year] 74 y.o. 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 He is doing okay. He and his wife had a good time down at the beach for Thanksgiving. The whole family was down.  They get out quite a bit. He did a lot of work around her house.  He is can back on smoking. Is cutting back on tobacco use.  His phlebotomies really take a lot out of him. We try to do the phlebotomies slowly.  He's had no headache. He is on Plavix. His had no bleeding.  He's had no change in bowel or bladder habits. He's had no nausea or vomiting. He's had no leg swelling.  Overall, his performance status is ECOG 1.   Medications:  Current outpatient prescriptions:  .  amLODipine (NORVASC) 10 MG tablet, , Disp: , Rfl:  .  Cholecalciferol (VITAMIN D) 2000 UNITS tablet, Take 2,000 Units by mouth daily. 03/08/15 taking 5000 u, Disp: , Rfl:  .  clopidogrel (PLAVIX) 75 MG tablet, Take 75 mg by mouth daily., Disp: , Rfl:  .  Folic Acid 696 MCG TABS, Take 400 mcg by mouth every morning., Disp: , Rfl:  .  loxapine (LOXITANE) 10 MG capsule, Take 10 mg by mouth daily., Disp: , Rfl:  .  ramipril (ALTACE) 10 MG tablet, Take 10 mg by mouth daily., Disp: , Rfl:  .  torsemide (DEMADEX) 20 MG tablet, Take 20 mg by mouth as needed., Disp: , Rfl:   Allergies:  Allergies  Allergen Reactions  . Sulfa Drugs Cross Reactors     Dizziness, nausea    Past Medical History, Surgical history, Social history, and Family History were reviewed and updated.  Review of Systems: As above  Physical Exam:  height is '5\' 7"'$  (1.702 m) and weight is 177 lb (80.287 kg). His oral temperature is 97.9 F (36.6 C). His blood pressure is 125/72 and his pulse is 97. His respiration is 20.    Well-developed and well-nourished gentleman in no obvious distress. Head and neck exam shows no ocular or oral lesions. He has no scleral icterus. There is no conjunctival inflammation. Neck is supple with no adenopathy. Lungs are clear. Cardiac exam regular rate and rhythm with no murmurs rubs or bruits. Abdomen is soft. He has good bowel sounds. There is no fluid wave. There is no palpable liver or spleen tip. Back exam shows no tenderness over the spine ribs or hips. Extremities shows no clubbing cyanosis or edema. Neurological exam shows no focal neurological deficits. Skin exam shows a ruddy complexion.  Lab Results  Component Value Date   WBC 9.4 06/20/2015   HGB 17.0 06/20/2015   HCT 49.6 06/20/2015   MCV 90 06/20/2015   PLT 253 06/20/2015     Chemistry      Component Value Date/Time   NA 141 03/08/2015 1034   NA 138 08/18/2014 1147   NA 138 04/21/2014 1157   K 3.9 03/08/2015 1034   K 4.0 08/18/2014 1147   K 3.6 04/21/2014 1157   CL 106 08/18/2014 1147   CL 102 04/21/2014 1157   CO2 21* 03/08/2015 1034   CO2 20 08/18/2014 1147   CO2 23 04/21/2014 1157   BUN 16.6 03/08/2015 1034  BUN 16 08/18/2014 1147   BUN 15 04/21/2014 1157   CREATININE 1.1 03/08/2015 1034   CREATININE 1.03 08/18/2014 1147   CREATININE 0.8 04/21/2014 1157      Component Value Date/Time   CALCIUM 9.4 03/08/2015 1034   CALCIUM 9.6 08/18/2014 1147   CALCIUM 9.5 04/21/2014 1157   ALKPHOS 72 03/08/2015 1034   ALKPHOS 63 08/18/2014 1147   ALKPHOS 49 04/21/2014 1157   AST 13 03/08/2015 1034   AST 15 08/18/2014 1147   AST 15 04/21/2014 1157   ALT 20 03/08/2015 1034   ALT 22 08/18/2014 1147   ALT 21 04/21/2014 1157   BILITOT 0.31 03/08/2015 1034   BILITOT 0.3 08/18/2014 1147   BILITOT 0.60 04/21/2014 1157         Impression and Plan: Robert Tran is 74 year old gentleman with polycythemia. He's doing well. He will be phlebotomized. Again, we have to try to keep his hematocrit below 45%. He  really does want to keep his blood below 50%. I told him as long as he is smoking, we have to be aggressive and keep his hematocrit below 45%.  I will plan to get back to see him in another 3 months.   I told him to just drink more water. He is now only drinking 3 cups a day of coffee. This is much better.   He still smokes but is down to less than 1 pack per day  . Volanda Napoleon, MD 12/5/20163:02 PM

## 2015-06-21 LAB — IRON AND TIBC
%SAT: 23 % (ref 20–55)
IRON: 76 ug/dL (ref 42–163)
TIBC: 331 ug/dL (ref 202–409)
UIBC: 256 ug/dL (ref 117–376)

## 2015-06-21 LAB — FERRITIN: FERRITIN: 26 ng/mL (ref 22–316)

## 2015-09-21 ENCOUNTER — Encounter: Payer: Self-pay | Admitting: Hematology & Oncology

## 2015-09-21 ENCOUNTER — Ambulatory Visit: Payer: Medicare Other

## 2015-09-21 ENCOUNTER — Ambulatory Visit (HOSPITAL_BASED_OUTPATIENT_CLINIC_OR_DEPARTMENT_OTHER): Payer: Medicare Other | Admitting: Hematology & Oncology

## 2015-09-21 ENCOUNTER — Other Ambulatory Visit (HOSPITAL_BASED_OUTPATIENT_CLINIC_OR_DEPARTMENT_OTHER): Payer: Medicare Other

## 2015-09-21 VITALS — BP 134/68 | HR 89 | Temp 97.6°F | Resp 16 | Ht 67.0 in | Wt 174.0 lb

## 2015-09-21 DIAGNOSIS — D45 Polycythemia vera: Secondary | ICD-10-CM | POA: Diagnosis present

## 2015-09-21 LAB — CBC WITH DIFFERENTIAL (CANCER CENTER ONLY)
BASO#: 0.1 10*3/uL (ref 0.0–0.2)
BASO%: 0.8 % (ref 0.0–2.0)
EOS ABS: 0.2 10*3/uL (ref 0.0–0.5)
EOS%: 2.3 % (ref 0.0–7.0)
HEMATOCRIT: 46.9 % (ref 38.7–49.9)
HGB: 16.2 g/dL (ref 13.0–17.1)
LYMPH#: 3 10*3/uL (ref 0.9–3.3)
LYMPH%: 30.8 % (ref 14.0–48.0)
MCH: 31.1 pg (ref 28.0–33.4)
MCHC: 34.5 g/dL (ref 32.0–35.9)
MCV: 90 fL (ref 82–98)
MONO#: 0.7 10*3/uL (ref 0.1–0.9)
MONO%: 7.6 % (ref 0.0–13.0)
NEUT#: 5.7 10*3/uL (ref 1.5–6.5)
NEUT%: 58.5 % (ref 40.0–80.0)
PLATELETS: 276 10*3/uL (ref 145–400)
RBC: 5.21 10*6/uL (ref 4.20–5.70)
RDW: 13.5 % (ref 11.1–15.7)
WBC: 9.7 10*3/uL (ref 4.0–10.0)

## 2015-09-21 LAB — COMPREHENSIVE METABOLIC PANEL
ALBUMIN: 3.6 g/dL (ref 3.5–5.0)
ALK PHOS: 69 U/L (ref 40–150)
ALT: 22 U/L (ref 0–55)
AST: 15 U/L (ref 5–34)
Anion Gap: 10 mEq/L (ref 3–11)
BILIRUBIN TOTAL: 0.34 mg/dL (ref 0.20–1.20)
BUN: 19.1 mg/dL (ref 7.0–26.0)
CALCIUM: 9.8 mg/dL (ref 8.4–10.4)
CO2: 21 mEq/L — ABNORMAL LOW (ref 22–29)
Chloride: 108 mEq/L (ref 98–109)
Creatinine: 1.5 mg/dL — ABNORMAL HIGH (ref 0.7–1.3)
EGFR: 45 mL/min/{1.73_m2} — AB (ref >90)
GLUCOSE: 125 mg/dL (ref 70–140)
POTASSIUM: 4 meq/L (ref 3.5–5.1)
SODIUM: 139 meq/L (ref 136–145)
TOTAL PROTEIN: 6.4 g/dL (ref 6.4–8.3)

## 2015-09-21 NOTE — Progress Notes (Signed)
2:41 PM Pt seen by Dr. Marin Olp, no phlebotomy ordered for today.

## 2015-09-21 NOTE — Progress Notes (Signed)
Hematology and Oncology Follow Up Visit  Robert Tran [id number] Aug 01, 1940 75 y.o. 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  Robert Tran is back for followup. He is doing okay. He's been pretty busy. His wife has been pretty dizzy. Did not be able to go to the [place] as much as they would like. Maybe they will go down there for Easter.  He was phlebotomized back in December.  He is trying to cut back on smoking. He says he still smokes 1 pack per day.  He is cutting back on his caffeine use.  He's had no problems with cough. He's had no shortness of breath. He's had no headaches. He's had no change in bowel or bladder habits. He's had no joint problems. He's had no leg swelling. He's had no rashes.  Overall, his performance status is ECOG 1.   Medications:  Current outpatient prescriptions:  .  clopidogrel (PLAVIX) 75 MG tablet, Take 75 mg by mouth daily., Disp: , Rfl:  .  Folic Acid 202 MCG TABS, Take 400 mcg by mouth every morning., Disp: , Rfl:  .  loxapine (LOXITANE) 10 MG capsule, Take 10 mg by mouth daily., Disp: , Rfl:  .  ramipril (ALTACE) 10 MG tablet, Take 10 mg by mouth daily., Disp: , Rfl:  .  torsemide (DEMADEX) 20 MG tablet, Take 20 mg by mouth as needed., Disp: , Rfl:  .  Cholecalciferol (VITAMIN D) 2000 UNITS tablet, Take 5,000 Units by mouth daily. 03/08/15 taking 5000 u, Disp: , Rfl:   Allergies:  Allergies  Allergen Reactions  . Sulfa Drugs Cross Reactors     Dizziness, nausea    Past Medical History, Surgical history, Social history, and Family History were reviewed and updated.  Review of Systems: As above  Physical Exam:  height is '5\' 7"'$  (1.702 m) and weight is 174 lb (78.926 kg). His oral temperature is 97.6 F (36.4 C). His blood pressure is 134/68 and his pulse is 89. His respiration is 16.    Well-developed and well-nourished gentleman in no obvious distress. Head and neck exam shows no ocular or oral lesions. He has no scleral icterus. There is no conjunctival inflammation. Neck is supple with no adenopathy. Lungs are clear. Cardiac exam regular rate and rhythm with no murmurs rubs or bruits. Abdomen is soft. He has good bowel sounds. There is no fluid wave. There is no palpable liver or spleen tip. Back exam shows no tenderness over the spine ribs or hips. Extremities shows no clubbing cyanosis or edema. Neurological exam shows no focal neurological deficits. Skin exam shows a ruddy complexion.  Lab Results  Component Value Date   WBC 9.7 09/21/2015   HGB 16.2 09/21/2015   HCT 46.9 09/21/2015   MCV 90 09/21/2015   PLT 276 09/21/2015     Chemistry      Component Value Date/Time   NA 141 03/08/2015 1034   NA 138 08/18/2014 1147   NA 138 04/21/2014 1157   K 3.9 03/08/2015 1034   K 4.0 08/18/2014 1147   K 3.6 04/21/2014 1157   CL 106 08/18/2014 1147   CL 102 04/21/2014 1157   CO2 21* 03/08/2015 1034   CO2 20 08/18/2014 1147   CO2 23 04/21/2014 1157   BUN 16.6 03/08/2015 1034   BUN 16 08/18/2014  1147   BUN 15 04/21/2014 1157   CREATININE 1.1 03/08/2015 1034   CREATININE 1.03 08/18/2014 1147   CREATININE 0.8 04/21/2014 1157      Component Value Date/Time   CALCIUM 9.4 03/08/2015 1034   CALCIUM 9.6 08/18/2014 1147   CALCIUM 9.5 04/21/2014 1157   ALKPHOS 72 03/08/2015 1034   ALKPHOS 63 08/18/2014 1147   ALKPHOS 49 04/21/2014 1157   AST 13 03/08/2015 1034   AST 15 08/18/2014 1147   AST 15 04/21/2014 1157   ALT 20 03/08/2015 1034   ALT 22 08/18/2014 1147   ALT 21 04/21/2014 1157   BILITOT 0.31 03/08/2015 1034   BILITOT 0.3 08/18/2014 1147   BILITOT 0.60 04/21/2014 1157         Impression and Plan: Mr. Robert Tran is 75 year old gentleman with polycythemia. He's doing well. Although his hematocrit is over 45%, I think we probably hold off on phlebotomizing him.  He feels good. He looks good.  We will plan to get him back in 3 more months. I think this would be reasonable. I'm sure that we will have to phlebotomize him we see him back.  He is doing his best to cut back on caffeine and cut back on tobacco use.   Volanda Napoleon, MD 3/8/20172:30 PM

## 2015-12-22 ENCOUNTER — Other Ambulatory Visit (HOSPITAL_BASED_OUTPATIENT_CLINIC_OR_DEPARTMENT_OTHER): Payer: Medicare Other

## 2015-12-22 ENCOUNTER — Encounter: Payer: Self-pay | Admitting: Hematology & Oncology

## 2015-12-22 ENCOUNTER — Ambulatory Visit (HOSPITAL_BASED_OUTPATIENT_CLINIC_OR_DEPARTMENT_OTHER): Payer: Medicare Other | Admitting: Hematology & Oncology

## 2015-12-22 ENCOUNTER — Ambulatory Visit (HOSPITAL_BASED_OUTPATIENT_CLINIC_OR_DEPARTMENT_OTHER): Payer: Medicare Other

## 2015-12-22 VITALS — BP 125/84 | HR 77 | Temp 97.7°F | Resp 16 | Ht 67.0 in | Wt 174.0 lb

## 2015-12-22 VITALS — BP 135/72 | HR 72 | Temp 98.2°F | Resp 16

## 2015-12-22 DIAGNOSIS — D45 Polycythemia vera: Secondary | ICD-10-CM | POA: Diagnosis present

## 2015-12-22 LAB — CBC WITH DIFFERENTIAL (CANCER CENTER ONLY)
BASO#: 0.1 10*3/uL (ref 0.0–0.2)
BASO%: 0.8 % (ref 0.0–2.0)
EOS%: 3.3 % (ref 0.0–7.0)
Eosinophils Absolute: 0.3 10*3/uL (ref 0.0–0.5)
HCT: 49 % (ref 38.7–49.9)
HGB: 17 g/dL (ref 13.0–17.1)
LYMPH#: 2.5 10*3/uL (ref 0.9–3.3)
LYMPH%: 31.9 % (ref 14.0–48.0)
MCH: 31.8 pg (ref 28.0–33.4)
MCHC: 34.7 g/dL (ref 32.0–35.9)
MCV: 92 fL (ref 82–98)
MONO#: 0.5 10*3/uL (ref 0.1–0.9)
MONO%: 6.5 % (ref 0.0–13.0)
NEUT#: 4.6 10*3/uL (ref 1.5–6.5)
NEUT%: 57.5 % (ref 40.0–80.0)
PLATELETS: 230 10*3/uL (ref 145–400)
RBC: 5.35 10*6/uL (ref 4.20–5.70)
RDW: 14.1 % (ref 11.1–15.7)
WBC: 8 10*3/uL (ref 4.0–10.0)

## 2015-12-22 LAB — COMPREHENSIVE METABOLIC PANEL
ALT: 20 U/L (ref 0–55)
AST: 12 U/L (ref 5–34)
Albumin: 3.6 g/dL (ref 3.5–5.0)
Alkaline Phosphatase: 66 U/L (ref 40–150)
Anion Gap: 7 mEq/L (ref 3–11)
BUN: 16.2 mg/dL (ref 7.0–26.0)
CHLORIDE: 108 meq/L (ref 98–109)
CO2: 24 meq/L (ref 22–29)
CREATININE: 1.3 mg/dL (ref 0.7–1.3)
Calcium: 9.6 mg/dL (ref 8.4–10.4)
EGFR: 53 mL/min/{1.73_m2} — ABNORMAL LOW (ref >90)
Glucose: 231 mg/dl — ABNORMAL HIGH (ref 70–140)
POTASSIUM: 4.1 meq/L (ref 3.5–5.1)
SODIUM: 139 meq/L (ref 136–145)
Total Bilirubin: 0.38 mg/dL (ref 0.20–1.20)
Total Protein: 6.6 g/dL (ref 6.4–8.3)

## 2015-12-22 NOTE — Progress Notes (Signed)
Hematology and Oncology Follow Up Visit  Robert Tran [id number] 1941-04-02 75 y.o. 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  Robert Tran is back for followup. He is doing okay. He's been pretty busy. His wife has been pretty busy. He is back to work. He is enjoying this. 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 They seem to be doing pretty well with this.  They're not sure when it will go down to their beach house. The family typically uses the beach house over the summer time and while there family is down the beach, Mr. Robert Tran and his wife stay up.[place]  He was phlebotomized back in December.  He is trying to cut back on smoking. He says he still smokes 1 pack per day.  He is cutting back on his caffeine use.  He's had no problems with cough. He's had no shortness of breath. He's had no headaches. He's had no change in bowel or bladder habits. He's had no joint problems. He's had no leg swelling. He's had no rashes.  Overall, his performance status is ECOG 1.   Medications:  Current outpatient prescriptions:  .  amLODipine (NORVASC) 10 MG tablet, Take 10 mg by mouth daily., Disp: , Rfl:  .  Cholecalciferol (VITAMIN D) 2000 UNITS tablet, Take 5,000 Units by mouth daily. 03/08/15 taking 5000 u, Disp: , Rfl:  .  clopidogrel (PLAVIX) 75 MG tablet, Take 75 mg by mouth daily., Disp: , Rfl:  .  Folic Acid 350 MCG TABS, Take 400 mcg by mouth every morning., Disp: , Rfl:  .  loxapine (LOXITANE) 10 MG capsule, Take 10 mg by mouth daily., Disp: , Rfl:  .  ramipril (ALTACE) 10 MG tablet, Take 10 mg by mouth daily., Disp: , Rfl:  .  torsemide (DEMADEX) 20 MG tablet, Take 20 mg by mouth as needed., Disp: , Rfl:   Allergies:  Allergies  Allergen Reactions  . Sulfa Drugs Cross Reactors     Dizziness, nausea    Past Medical History, Surgical  history, Social history, and Family History were reviewed and updated.  Review of Systems: As above  Physical Exam:  height is '5\' 7"'$  (1.702 m) and weight is 174 lb (78.926 kg). His oral temperature is 97.7 F (36.5 C). His blood pressure is 125/84 and his pulse is 77. His respiration is 16.   Well-developed and well-nourished gentleman in no obvious distress. Head and neck exam shows no ocular or oral lesions. He has no scleral icterus. There is no conjunctival inflammation. Neck is supple with no adenopathy. Lungs are clear. Cardiac exam regular rate and rhythm with no murmurs rubs or bruits. Abdomen is soft. He has good bowel sounds. There is no fluid wave. There is no palpable liver or spleen tip. Back exam shows no tenderness over the spine ribs or hips. Extremities shows no clubbing cyanosis or edema. Neurological exam shows no focal neurological deficits. Skin exam shows a ruddy complexion.  Lab Results  Component Value Date   WBC 8.0 12/22/2015   HGB 17.0 12/22/2015   HCT 49.0 12/22/2015   MCV 92 12/22/2015   PLT 230 12/22/2015     Chemistry      Component Value Date/Time   NA 139 09/21/2015 1339   NA 138 08/18/2014 1147   NA 138  04/21/2014 1157   K 4.0 09/21/2015 1339   K 4.0 08/18/2014 1147   K 3.6 04/21/2014 1157   CL 106 08/18/2014 1147   CL 102 04/21/2014 1157   CO2 21* 09/21/2015 1339   CO2 20 08/18/2014 1147   CO2 23 04/21/2014 1157   BUN 19.1 09/21/2015 1339   BUN 16 08/18/2014 1147   BUN 15 04/21/2014 1157   CREATININE 1.5* 09/21/2015 1339   CREATININE 1.03 08/18/2014 1147   CREATININE 0.8 04/21/2014 1157      Component Value Date/Time   CALCIUM 9.8 09/21/2015 1339   CALCIUM 9.6 08/18/2014 1147   CALCIUM 9.5 04/21/2014 1157   ALKPHOS 69 09/21/2015 1339   ALKPHOS 63 08/18/2014 1147   ALKPHOS 49 04/21/2014 1157   AST 15 09/21/2015 1339   AST 15 08/18/2014 1147   AST 15 04/21/2014 1157   ALT 22 09/21/2015 1339   ALT 22 08/18/2014 1147   ALT 21  04/21/2014 1157   BILITOT 0.34 09/21/2015 1339   BILITOT 0.3 08/18/2014 1147   BILITOT 0.60 04/21/2014 1157         Impression and Plan: Mr. Robert Tran is 75 year old gentleman with polycythemia. He's doing well. We will definitely phlebotomize him today. He has not been phlebotomized for about 6 months.   I'll plan to get him back in 3 months. He may need another phlebotomy then.   He is doing his best to cut back on caffeine and cut back on tobacco use.   Volanda Napoleon, MD 6/8/20172:42 PM

## 2015-12-22 NOTE — Patient Instructions (Signed)

## 2015-12-23 LAB — IRON AND TIBC
%SAT: 35 % (ref 20–55)
Iron: 109 ug/dL (ref 42–163)
TIBC: 310 ug/dL (ref 202–409)
UIBC: 201 ug/dL (ref 117–376)

## 2015-12-23 LAB — FERRITIN: FERRITIN: 20 ng/mL — AB (ref 22–316)

## 2016-01-02 DIAGNOSIS — E782 Mixed hyperlipidemia: Secondary | ICD-10-CM | POA: Insufficient documentation

## 2016-01-02 DIAGNOSIS — Z79899 Other long term (current) drug therapy: Secondary | ICD-10-CM | POA: Insufficient documentation

## 2016-01-02 DIAGNOSIS — J449 Chronic obstructive pulmonary disease, unspecified: Secondary | ICD-10-CM | POA: Insufficient documentation

## 2016-01-02 DIAGNOSIS — N183 Chronic kidney disease, stage 3 unspecified: Secondary | ICD-10-CM | POA: Insufficient documentation

## 2016-03-29 ENCOUNTER — Ambulatory Visit (HOSPITAL_BASED_OUTPATIENT_CLINIC_OR_DEPARTMENT_OTHER): Payer: Medicare Other

## 2016-03-29 ENCOUNTER — Ambulatory Visit (HOSPITAL_BASED_OUTPATIENT_CLINIC_OR_DEPARTMENT_OTHER): Payer: Medicare Other | Admitting: Hematology & Oncology

## 2016-03-29 ENCOUNTER — Encounter: Payer: Self-pay | Admitting: Hematology & Oncology

## 2016-03-29 ENCOUNTER — Other Ambulatory Visit (HOSPITAL_BASED_OUTPATIENT_CLINIC_OR_DEPARTMENT_OTHER): Payer: Medicare Other

## 2016-03-29 VITALS — BP 130/71 | HR 87 | Temp 98.2°F | Resp 16

## 2016-03-29 VITALS — BP 152/66 | HR 85 | Temp 97.5°F | Resp 18 | Ht 67.0 in | Wt 176.8 lb

## 2016-03-29 DIAGNOSIS — D45 Polycythemia vera: Secondary | ICD-10-CM

## 2016-03-29 LAB — COMPREHENSIVE METABOLIC PANEL
ALT: 21 U/L (ref 0–55)
ANION GAP: 7 meq/L (ref 3–11)
AST: 12 U/L (ref 5–34)
Albumin: 3.4 g/dL — ABNORMAL LOW (ref 3.5–5.0)
Alkaline Phosphatase: 76 U/L (ref 40–150)
BUN: 18.7 mg/dL (ref 7.0–26.0)
CALCIUM: 9.7 mg/dL (ref 8.4–10.4)
CO2: 20 mEq/L — ABNORMAL LOW (ref 22–29)
CREATININE: 1.2 mg/dL (ref 0.7–1.3)
Chloride: 110 mEq/L — ABNORMAL HIGH (ref 98–109)
EGFR: 56 mL/min/{1.73_m2} — ABNORMAL LOW (ref >90)
Glucose: 308 mg/dl — ABNORMAL HIGH (ref 70–140)
Potassium: 4.3 mEq/L (ref 3.5–5.1)
Sodium: 136 mEq/L (ref 136–145)
TOTAL PROTEIN: 6.4 g/dL (ref 6.4–8.3)

## 2016-03-29 LAB — CBC WITH DIFFERENTIAL (CANCER CENTER ONLY)
BASO#: 0.1 10*3/uL (ref 0.0–0.2)
BASO%: 0.7 % (ref 0.0–2.0)
EOS%: 2.9 % (ref 0.0–7.0)
Eosinophils Absolute: 0.3 10*3/uL (ref 0.0–0.5)
HEMATOCRIT: 47.3 % (ref 38.7–49.9)
HEMOGLOBIN: 16.5 g/dL (ref 13.0–17.1)
LYMPH#: 2.6 10*3/uL (ref 0.9–3.3)
LYMPH%: 29.4 % (ref 14.0–48.0)
MCH: 31.7 pg (ref 28.0–33.4)
MCHC: 34.9 g/dL (ref 32.0–35.9)
MCV: 91 fL (ref 82–98)
MONO#: 0.7 10*3/uL (ref 0.1–0.9)
MONO%: 8.3 % (ref 0.0–13.0)
NEUT%: 58.7 % (ref 40.0–80.0)
NEUTROS ABS: 5.1 10*3/uL (ref 1.5–6.5)
Platelets: 266 10*3/uL (ref 145–400)
RBC: 5.2 10*6/uL (ref 4.20–5.70)
RDW: 14 % (ref 11.1–15.7)
WBC: 8.8 10*3/uL (ref 4.0–10.0)

## 2016-03-29 NOTE — Patient Instructions (Signed)

## 2016-03-29 NOTE — Progress Notes (Signed)
Hematology and Oncology Follow Up Visit  Robert Tran [id number] 1941/03/12 75 y.o. 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 He is doing okay. He's been pretty busy. His wife has been pretty busy. He is back to work. He is enjoying this. 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  They are cutting back on going to their beach house. I know there are down there a couple weeks ago. It seems like there are not a lot of projects that had to do at [employer]. His wife like to go down more often but Robert Tran does not like to go down and sit around and do nothing.  He is still smoking. He is trying to cut back.   He has had no headache. He has had no problems with bowels or bladder. He has had no rashes. He has had no leg swelling.   Overall, his performance status is ECOG 1.  Medications:  Current Outpatient Prescriptions:  .  amLODipine (NORVASC) 10 MG tablet, Take 10 mg by mouth daily., Disp: , Rfl:  .  Cholecalciferol (VITAMIN D) 2000 UNITS tablet, Take 5,000 Units by mouth daily. 03/08/15 taking 5000 u, Disp: , Rfl:  .  clopidogrel (PLAVIX) 75 MG tablet, Take 75 mg by mouth daily., Disp: , Rfl:  .  Folic Acid 735 MCG TABS, Take 400 mcg by mouth every morning., Disp: , Rfl:  .  ramipril (ALTACE) 10 MG tablet, Take 10 mg by mouth daily., Disp: , Rfl:  .  torsemide (DEMADEX) 20 MG tablet, Take 20 mg by mouth as needed., Disp: , Rfl:   Allergies:  Allergies  Allergen Reactions  . Sulfa Drugs Cross Reactors     Dizziness, nausea  . Minocycline Nausea Only  . Pravastatin Sodium Other (See Comments)    Past Medical History, Surgical history, Social history, and Family History were reviewed and updated.  Review of Systems: As above  Physical  Exam:  height is '5\' 7"'$  (1.702 m) and weight is 176 lb 12.8 oz (80.2 kg). His oral temperature is 97.5 F (36.4 C). His blood pressure is 152/66 (abnormal) and his pulse is 85. His respiration is 18.   Well-developed and well-nourished gentleman in no obvious distress. Head and neck exam shows no ocular or oral lesions. He has no scleral icterus. There is no conjunctival inflammation. Neck is supple with no adenopathy. Lungs are clear. Cardiac exam regular rate and rhythm with no murmurs rubs or bruits. Abdomen is soft. He has good bowel sounds. There is no fluid wave. There is no palpable liver or spleen tip. Back exam shows no tenderness over the spine ribs or hips. Extremities shows no clubbing cyanosis or edema. Neurological exam shows no focal neurological deficits. Skin exam shows a ruddy complexion.  Lab Results  Component Value Date   WBC 8.8 03/29/2016   HGB 16.5 03/29/2016   HCT 47.3 03/29/2016   MCV 91 03/29/2016   PLT 266 03/29/2016     Chemistry      Component Value Date/Time   NA 139 12/22/2015 1322   K 4.1 12/22/2015 1322   CL 106 08/18/2014 1147   CL 102 04/21/2014 1157   CO2 24 12/22/2015 1322  BUN 16.2 12/22/2015 1322   CREATININE 1.3 12/22/2015 1322      Component Value Date/Time   CALCIUM 9.6 12/22/2015 1322   ALKPHOS 66 12/22/2015 1322   AST 12 12/22/2015 1322   ALT 20 12/22/2015 1322   BILITOT 0.38 12/22/2015 1322         Impression and Plan: Robert Tran is 75 year old gentleman with polycythemia. He's doing well. We will definitely phlebotomize him today. He has not been phlebotomized for about 3 months.   I'll plan to get him back in 3 months. He may need another phlebotomy then.   He is doing his best to cut back on caffeine and cut back on tobacco use.   Volanda Napoleon, MD 9/14/20172:33 PM

## 2016-03-30 LAB — IRON AND TIBC
%SAT: 25 % (ref 20–55)
Iron: 82 ug/dL (ref 42–163)
TIBC: 333 ug/dL (ref 202–409)
UIBC: 250 ug/dL (ref 117–376)

## 2016-03-30 LAB — FERRITIN: FERRITIN: 17 ng/mL — AB (ref 22–316)

## 2016-06-28 ENCOUNTER — Other Ambulatory Visit (HOSPITAL_BASED_OUTPATIENT_CLINIC_OR_DEPARTMENT_OTHER): Payer: Medicare Other

## 2016-06-28 ENCOUNTER — Ambulatory Visit (HOSPITAL_BASED_OUTPATIENT_CLINIC_OR_DEPARTMENT_OTHER): Payer: Medicare Other

## 2016-06-28 ENCOUNTER — Ambulatory Visit (HOSPITAL_BASED_OUTPATIENT_CLINIC_OR_DEPARTMENT_OTHER): Payer: Medicare Other | Admitting: Hematology & Oncology

## 2016-06-28 VITALS — BP 131/68 | HR 93 | Temp 97.7°F | Resp 18 | Wt 176.4 lb

## 2016-06-28 DIAGNOSIS — J0100 Acute maxillary sinusitis, unspecified: Secondary | ICD-10-CM

## 2016-06-28 DIAGNOSIS — J019 Acute sinusitis, unspecified: Secondary | ICD-10-CM

## 2016-06-28 DIAGNOSIS — D45 Polycythemia vera: Secondary | ICD-10-CM

## 2016-06-28 LAB — CMP (CANCER CENTER ONLY)
ALBUMIN: 3.5 g/dL (ref 3.3–5.5)
ALK PHOS: 69 U/L (ref 26–84)
ALT: 24 U/L (ref 10–47)
AST: 19 U/L (ref 11–38)
BUN: 15 mg/dL (ref 7–22)
CHLORIDE: 104 meq/L (ref 98–108)
CO2: 25 mEq/L (ref 18–33)
CREATININE: 1.2 mg/dL (ref 0.6–1.2)
Calcium: 10 mg/dL (ref 8.0–10.3)
Glucose, Bld: 261 mg/dL — ABNORMAL HIGH (ref 73–118)
Potassium: 3.6 mEq/L (ref 3.3–4.7)
SODIUM: 137 meq/L (ref 128–145)
TOTAL PROTEIN: 6.3 g/dL — AB (ref 6.4–8.1)
Total Bilirubin: 0.5 mg/dl (ref 0.20–1.60)

## 2016-06-28 LAB — CBC WITH DIFFERENTIAL (CANCER CENTER ONLY)
BASO#: 0.1 10*3/uL (ref 0.0–0.2)
BASO%: 0.9 % (ref 0.0–2.0)
EOS ABS: 0.3 10*3/uL (ref 0.0–0.5)
EOS%: 3.1 % (ref 0.0–7.0)
HCT: 48.4 % (ref 38.7–49.9)
HEMOGLOBIN: 16.6 g/dL (ref 13.0–17.1)
LYMPH#: 2.9 10*3/uL (ref 0.9–3.3)
LYMPH%: 30.5 % (ref 14.0–48.0)
MCH: 30.8 pg (ref 28.0–33.4)
MCHC: 34.3 g/dL (ref 32.0–35.9)
MCV: 90 fL (ref 82–98)
MONO#: 0.7 10*3/uL (ref 0.1–0.9)
MONO%: 7.3 % (ref 0.0–13.0)
NEUT%: 58.2 % (ref 40.0–80.0)
NEUTROS ABS: 5.5 10*3/uL (ref 1.5–6.5)
PLATELETS: 260 10*3/uL (ref 145–400)
RBC: 5.39 10*6/uL (ref 4.20–5.70)
RDW: 13.5 % (ref 11.1–15.7)
WBC: 9.5 10*3/uL (ref 4.0–10.0)

## 2016-06-28 MED ORDER — DEXTROSE 5 % IV SOLN
2.0000 g | INTRAVENOUS | Status: DC
  Administered 2016-06-28: 2 g via INTRAVENOUS
  Filled 2016-06-28: qty 2

## 2016-06-28 MED ORDER — CEFDINIR 300 MG PO CAPS: 600.0000 mg | ORAL_CAPSULE | Freq: Every day | ORAL | 0 refills | Status: DC

## 2016-06-28 MED ORDER — SODIUM CHLORIDE 0.9 % IV SOLN
INTRAVENOUS | Status: AC
  Administered 2016-06-28: 15:00:00 via INTRAVENOUS

## 2016-06-28 NOTE — Progress Notes (Signed)
Hematology and Oncology Follow Up Visit  Robert Tran [id number] June 08, 1941 75 y.o. 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 He is doing as well today. He has a lot of tenderness over his face. He thinks he has a sinus infection. He is producing greenish mucus when he blows his nose. He occasionally coughs up some purulent mucus. He's had a little bit of a low-grade temperature he feels. He says on occasion, some of the nasal discharge is bloody.  He is still smoking. He is still drinking coffee. He is trying to cut back on these.  He is on Plavix. I would continue on the Plavix for right now.  His iron studies have been doing quite well. Back in September, his ferritin was 17 with iron saturation of 25%. We are doing well with his phlebotomies to try to get him iron depleted.  He and his wife had a very nice Thanksgiving. They were at the [place] going back to the beach for Christmas.  He is still working. He is working part-time.  He's had no nausea or vomiting. He's had no change in bowel or bladder habits. He's had no leg swelling.  Overall, his performance status is ECOG 1.  Medications:  Current Outpatient Prescriptions:  .  amLODipine (NORVASC) 10 MG tablet, Take 10 mg by mouth daily., Disp: , Rfl:  .  Cholecalciferol (VITAMIN D) 2000 UNITS tablet, Take 5,000 Units by mouth daily. 03/08/15 taking 5000 u, Disp: , Rfl:  .  clopidogrel (PLAVIX) 75 MG tablet, Take 75 mg by mouth daily., Disp: , Rfl:  .  Folic Acid 563 MCG TABS, Take 400 mcg by mouth every morning., Disp: , Rfl:  .  ramipril (ALTACE) 10 MG tablet, Take 10 mg by mouth daily., Disp: , Rfl:  .  torsemide (DEMADEX) 20 MG tablet, Take 20 mg by mouth as needed., Disp: , Rfl:  No current facility-administered medications for this  visit.   Facility-Administered Medications Ordered in Other Visits:  .  cefTRIAXone (ROCEPHIN) 2 g in dextrose 5 % 50 mL IVPB, 2 g, Intravenous, Q24H, Volanda Napoleon, MD, 2 g at 06/28/16 1542  Allergies:  Allergies  Allergen Reactions  . Sulfa Drugs Cross Reactors     Dizziness, nausea  . Minocycline Nausea Only  . Pravastatin Sodium Other (See Comments)    Past Medical History, Surgical history, Social history, and Family History were reviewed and updated.  Review of Systems: As above  Physical Exam:  weight is 176 lb 6.4 oz (80 kg). His oral temperature is 97.7 F (36.5 C). His blood pressure is 131/68 and his pulse is 93. His respiration is 18 and oxygen saturation is 97%.   Well-developed and well-nourished gentleman in no obvious distress. Head and neck exam shows no ocular or oral lesions.He has a lot of tenderness over the maxillary sinuses. He has more tenderness over the left sinus. Actually looked up his nasal passages. He has some congestion and inflammation of the mucosa. He has no scleral icterus. There is no conjunctival inflammation. Neck is supple with no adenopathy. Lungs are clear. Cardiac exam regular rate and rhythm with no murmurs rubs or bruits. Abdomen is soft. He has good bowel sounds. There is no fluid wave. There is no palpable liver or  spleen tip. Back exam shows no tenderness over the spine ribs or hips. Extremities shows no clubbing cyanosis or edema. Neurological exam shows no focal neurological deficits. Skin exam shows a ruddy complexion.  Lab Results  Component Value Date   WBC 9.5 06/28/2016   HGB 16.6 06/28/2016   HCT 48.4 06/28/2016   MCV 90 06/28/2016   PLT 260 06/28/2016     Chemistry      Component Value Date/Time   NA 137 06/28/2016 1344   NA 136 03/29/2016 1317   K 3.6 06/28/2016 1344   K 4.3 03/29/2016 1317   CL 104 06/28/2016 1344   CO2 25 06/28/2016 1344   CO2 20 (L) 03/29/2016 1317   BUN 15 06/28/2016 1344   BUN 18.7 03/29/2016  1317   CREATININE 1.2 06/28/2016 1344   CREATININE 1.2 03/29/2016 1317      Component Value Date/Time   CALCIUM 10.0 06/28/2016 1344   CALCIUM 9.7 03/29/2016 1317   ALKPHOS 69 06/28/2016 1344   ALKPHOS 76 03/29/2016 1317   AST 19 06/28/2016 1344   AST 12 03/29/2016 1317   ALT 24 06/28/2016 1344   ALT 21 03/29/2016 1317   BILITOT 0.50 06/28/2016 1344   BILITOT <0.30 03/29/2016 1317         Impression and Plan: Robert Tran is 75 year old gentleman with polycythemia. He's doing well. We will definitely phlebotomize him today.  Looks like he has a fairly acute sinus infection. He really has tenderness over the sinuses. He is having purulent drainage from his nose. He occasionally coughs up some greenish sputum.  I think that we probably should give him a dose of IV Rocephin in the office. I believe this will be helpful to try to knock down this sinusitis. I would then put him on some Omnicef. I would get him on Omnicef for about 7 days.  I told him that as long as he is smoking, he will be at higher risk for sinus infections.   I would like to get him back to see [place] in about 6 weeks or so. I think we have to keep a close eye out on him right now.   I spent about 25 minutes with he and his wife. I want to make sure that we are [place] with this sinus infection so that he does not and up at urgent care or worse, the emergency room.   Volanda Napoleon, MD 12/14/20175:23 PM

## 2016-06-28 NOTE — Patient Instructions (Signed)
Therapeutic Phlebotomy, Care After  Refer to this sheet in the next few weeks. These instructions provide you with information about caring for yourself after your procedure. Your health care provider may also give you more specific instructions. Your treatment has been planned according to current medical practices, but problems sometimes occur. Call your health care provider if you have any problems or questions after your procedure.  WHAT TO EXPECT AFTER THE PROCEDURE  After your procedure, it is common to have:   Light-headedness or dizziness. You may feel faint.   Nausea.   Tiredness.  HOME CARE INSTRUCTIONS  Activities   Return to your normal activities as directed by your health care provider. Most people can go back to their normal activities right away.   Avoid strenuous physical activity and heavy lifting or pulling for about 5 hours after the procedure. Do not lift anything that is heavier than 10 lb (4.5 kg).   Athletes should avoid strenuous exercise for at least 12 hours.   Change positions slowly for the remainder of the day. This will help to prevent light-headedness or fainting.   If you feel light-headed, lie down until the feeling goes away.  Eating and Drinking   Be sure to eat well-balanced meals for the next 24 hours.   Drink enough fluid to keep your urine clear or pale yellow.   Avoid drinking alcohol on the day that you had the procedure.  Care of the Needle Insertion Site   Keep your bandage dry. You can remove the bandage after about 5 hours or as directed by your health care provider.   If you have bleeding from the needle insertion site, elevate your arm and press firmly on the site until the bleeding stops.   If you have bruising at the site, apply ice to the area:   Put ice in a plastic bag.   Place a towel between your skin and the bag.   Leave the ice on for 20 minutes, 2-3 times a day for the first 24 hours.   If the swelling does not go away after 24 hours, apply  a warm, moist washcloth to the area for 20 minutes, 2-3 times a day.  General Instructions   Avoid smoking for at least 30 minutes after the procedure.   Keep all follow-up visits as directed by your health care provider. It is important to continue with further therapeutic phlebotomy treatments as directed.  SEEK MEDICAL CARE IF:   You have redness, swelling, or pain at the needle insertion site.   You have fluid, blood, or pus coming from the needle insertion site.   You feel light-headed, dizzy, or nauseated, and the feeling does not go away.   You notice new bruising at the needle insertion site.   You feel weaker than normal.   You have a fever or chills.  SEEK IMMEDIATE MEDICAL CARE IF:   You have severe nausea or vomiting.   You have chest pain.   You have trouble breathing.    This information is not intended to replace advice given to you by your health care provider. Make sure you discuss any questions you have with your health care provider.    Document Released: 12/04/2010 Document Revised: 11/16/2014 Document Reviewed: 06/28/2014  Elsevier Interactive Patient Education 2016 [employer]

## 2016-06-28 NOTE — Progress Notes (Signed)
Robert Tran presents today for phlebotomy per MD orders. Phlebotomy procedure started at 1510 and ended at 1530. 500 grams removed. Patient observed for 30 minutes after procedure without any incident. Patient tolerated procedure well. IV needle removed intact.

## 2016-06-29 LAB — IRON AND TIBC
%SAT: 31 % (ref 20–55)
IRON: 98 ug/dL (ref 42–163)
TIBC: 319 ug/dL (ref 202–409)
UIBC: 221 ug/dL (ref 117–376)

## 2016-06-29 LAB — FERRITIN: FERRITIN: 21 ng/mL — AB (ref 22–316)

## 2016-08-09 ENCOUNTER — Other Ambulatory Visit: Payer: Self-pay | Admitting: *Deleted

## 2016-08-09 ENCOUNTER — Ambulatory Visit (HOSPITAL_BASED_OUTPATIENT_CLINIC_OR_DEPARTMENT_OTHER): Payer: Medicare Other | Admitting: Hematology & Oncology

## 2016-08-09 ENCOUNTER — Other Ambulatory Visit (HOSPITAL_BASED_OUTPATIENT_CLINIC_OR_DEPARTMENT_OTHER): Payer: Medicare Other

## 2016-08-09 ENCOUNTER — Ambulatory Visit (HOSPITAL_BASED_OUTPATIENT_CLINIC_OR_DEPARTMENT_OTHER): Payer: Medicare Other

## 2016-08-09 VITALS — BP 128/69 | HR 87 | Temp 97.6°F | Resp 16 | Wt 174.0 lb

## 2016-08-09 DIAGNOSIS — D45 Polycythemia vera: Secondary | ICD-10-CM

## 2016-08-09 DIAGNOSIS — J0100 Acute maxillary sinusitis, unspecified: Secondary | ICD-10-CM

## 2016-08-09 LAB — COMPREHENSIVE METABOLIC PANEL
ALT: 23 U/L (ref 0–55)
ANION GAP: 9 meq/L (ref 3–11)
AST: 14 U/L (ref 5–34)
Albumin: 3.7 g/dL (ref 3.5–5.0)
Alkaline Phosphatase: 75 U/L (ref 40–150)
BILIRUBIN TOTAL: 0.34 mg/dL (ref 0.20–1.20)
BUN: 19.9 mg/dL (ref 7.0–26.0)
CALCIUM: 10 mg/dL (ref 8.4–10.4)
CO2: 20 mEq/L — ABNORMAL LOW (ref 22–29)
CREATININE: 1.3 mg/dL (ref 0.7–1.3)
Chloride: 106 mEq/L (ref 98–109)
EGFR: 51 mL/min/{1.73_m2} — AB (ref >90)
Glucose: 265 mg/dl — ABNORMAL HIGH (ref 70–140)
Potassium: 4.2 mEq/L (ref 3.5–5.1)
Sodium: 134 mEq/L — ABNORMAL LOW (ref 136–145)
TOTAL PROTEIN: 6.7 g/dL (ref 6.4–8.3)

## 2016-08-09 LAB — CBC WITH DIFFERENTIAL (CANCER CENTER ONLY)
BASO#: 0.1 10*3/uL (ref 0.0–0.2)
BASO%: 0.9 % (ref 0.0–2.0)
EOS ABS: 0.2 10*3/uL (ref 0.0–0.5)
EOS%: 2.4 % (ref 0.0–7.0)
HEMATOCRIT: 47.9 % (ref 38.7–49.9)
HGB: 16.4 g/dL (ref 13.0–17.1)
LYMPH#: 3 10*3/uL (ref 0.9–3.3)
LYMPH%: 29.9 % (ref 14.0–48.0)
MCH: 31.2 pg (ref 28.0–33.4)
MCHC: 34.2 g/dL (ref 32.0–35.9)
MCV: 91 fL (ref 82–98)
MONO#: 0.7 10*3/uL (ref 0.1–0.9)
MONO%: 7.2 % (ref 0.0–13.0)
NEUT#: 6 10*3/uL (ref 1.5–6.5)
NEUT%: 59.6 % (ref 40.0–80.0)
PLATELETS: 285 10*3/uL (ref 145–400)
RBC: 5.26 10*6/uL (ref 4.20–5.70)
RDW: 13.9 % (ref 11.1–15.7)
WBC: 10 10*3/uL (ref 4.0–10.0)

## 2016-08-09 MED ORDER — SODIUM CHLORIDE 0.9 % IV SOLN
1000.0000 mL | Freq: Once | INTRAVENOUS | Status: AC
  Administered 2016-08-09: 500 mL via INTRAVENOUS

## 2016-08-09 NOTE — Progress Notes (Signed)
Hematology and Oncology Follow Up Visit  Robert Tran [id number] [birth year] 76 y.o. 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 is back for followup. He is doing well today. We last saw him back in December before Christmas. At that time, he did have a problem with sinusitis. He got over this period   He has had no problems otherwise. He's had no cough or shortness of breath. He's had no nausea or vomiting. He's had no rashes. He's had no change in bowel or bladder habits.   He was last phlebotomized in December.  He is on come back on tobacco use. He drinks one cup of coffee a day now.   He is still working. His wife is still working.   [place] Hopefully, this will work out for them.   Overall, his performance status is ECOG 1.  Medications:  Current Outpatient Prescriptions:  .  amLODipine (NORVASC) 10 MG tablet, Take 10 mg by mouth daily., Disp: , Rfl:  .  Cholecalciferol (VITAMIN D) 2000 UNITS tablet, Take 5,000 Units by mouth daily. 03/08/15 taking 5000 u, Disp: , Rfl:  .  clopidogrel (PLAVIX) 75 MG tablet, Take 75 mg by mouth daily., Disp: , Rfl:  .  Folic Acid 009 MCG TABS, Take 400 mcg by mouth every morning., Disp: , Rfl:  .  ramipril (ALTACE) 10 MG tablet, Take 10 mg by mouth daily., Disp: , Rfl:  .  torsemide (DEMADEX) 20 MG tablet, Take 20 mg by mouth as needed., Disp: , Rfl:   Allergies:  Allergies  Allergen Reactions  . Sulfa Drugs Cross Reactors     Dizziness, nausea  . Minocycline Nausea Only  . Pravastatin Sodium Other (See Comments)    Past Medical History, Surgical history, Social history, and Family History were reviewed and updated.  Review of Systems: As above  Physical Exam:  weight is 174 lb (78.9 kg). His oral temperature is 97.6 F (36.4 C).  His blood pressure is 128/69 and his pulse is 87. His respiration is 16 and oxygen saturation is 99%.   Well-developed and well-nourished gentleman in no obvious distress. Head and neck exam shows no ocular or oral lesions.He has a lot of tenderness over the maxillary sinuses. He has more tenderness over the left sinus. Actually looked up his nasal passages. He has some congestion and inflammation of the mucosa. He has no scleral icterus. There is no conjunctival inflammation. Neck is supple with no adenopathy. Lungs are clear. Cardiac exam regular rate and rhythm with no murmurs rubs or bruits. Abdomen is soft. He has good bowel sounds. There is no fluid wave. There is no palpable liver or spleen tip. Back exam shows no tenderness over the spine ribs or hips. Extremities shows no clubbing cyanosis or edema. Neurological exam shows no focal neurological deficits. Skin exam shows a ruddy complexion.  Lab Results  Component Value Date   WBC 10.0 08/09/2016   HGB 16.4 08/09/2016   HCT 47.9 08/09/2016   MCV 91 08/09/2016   PLT 285 08/09/2016     Chemistry      Component Value Date/Time   NA 137 06/28/2016 1344   NA 136 03/29/2016 1317   K 3.6 06/28/2016 1344   K 4.3 03/29/2016 1317  CL 104 06/28/2016 1344   CO2 25 06/28/2016 1344   CO2 20 (L) 03/29/2016 1317   BUN 15 06/28/2016 1344   BUN 18.7 03/29/2016 1317   CREATININE 1.2 06/28/2016 1344   CREATININE 1.2 03/29/2016 1317      Component Value Date/Time   CALCIUM 10.0 06/28/2016 1344   CALCIUM 9.7 03/29/2016 1317   ALKPHOS 69 06/28/2016 1344   ALKPHOS 76 03/29/2016 1317   AST 19 06/28/2016 1344   AST 12 03/29/2016 1317   ALT 24 06/28/2016 1344   ALT 21 03/29/2016 1317   BILITOT 0.50 06/28/2016 1344   BILITOT <0.30 03/29/2016 1317         Impression and Plan: Robert Tran is 76 year old gentleman with polycythemia. He's doing well. We will definitely phlebotomize him today. He is very sensitive to phlebotomies. As such, we will  go ahead and take off half a bag of blood and then give him back 500 mL of fluid.  I think we can get him back in about 2 months. I really would like to get his hematocrit below 45%. However, he just has a hard time with phlebotomies.   Hopefully, all will [place]   . Volanda Napoleon, MD 1/25/20182:25 PM

## 2016-08-10 LAB — FERRITIN: Ferritin: 12 ng/ml — ABNORMAL LOW (ref 22–316)

## 2016-08-10 LAB — IRON AND TIBC
%SAT: 35 % (ref 20–55)
IRON: 127 ug/dL (ref 42–163)
TIBC: 363 ug/dL (ref 202–409)
UIBC: 236 ug/dL (ref 117–376)

## 2016-09-12 DIAGNOSIS — F17200 Nicotine dependence, unspecified, uncomplicated: Secondary | ICD-10-CM | POA: Insufficient documentation

## 2016-09-12 DIAGNOSIS — D751 Secondary polycythemia: Secondary | ICD-10-CM | POA: Insufficient documentation

## 2016-10-01 ENCOUNTER — Ambulatory Visit: Payer: Medicare Other | Admitting: Hematology & Oncology

## 2016-10-01 ENCOUNTER — Other Ambulatory Visit: Payer: Medicare Other

## 2016-10-25 ENCOUNTER — Other Ambulatory Visit (HOSPITAL_BASED_OUTPATIENT_CLINIC_OR_DEPARTMENT_OTHER): Payer: Medicare Other

## 2016-10-25 ENCOUNTER — Ambulatory Visit (HOSPITAL_BASED_OUTPATIENT_CLINIC_OR_DEPARTMENT_OTHER): Payer: Medicare Other | Admitting: Hematology & Oncology

## 2016-10-25 ENCOUNTER — Ambulatory Visit (HOSPITAL_BASED_OUTPATIENT_CLINIC_OR_DEPARTMENT_OTHER): Payer: Medicare Other

## 2016-10-25 VITALS — BP 127/62 | HR 86 | Temp 98.1°F | Resp 18 | Wt 172.8 lb

## 2016-10-25 VITALS — BP 110/63 | HR 74 | Resp 17

## 2016-10-25 DIAGNOSIS — D45 Polycythemia vera: Secondary | ICD-10-CM | POA: Diagnosis present

## 2016-10-25 LAB — CBC WITH DIFFERENTIAL (CANCER CENTER ONLY)
BASO#: 0.1 10*3/uL (ref 0.0–0.2)
BASO%: 1 % (ref 0.0–2.0)
EOS%: 2.5 % (ref 0.0–7.0)
Eosinophils Absolute: 0.2 10*3/uL (ref 0.0–0.5)
HCT: 47.8 % (ref 38.7–49.9)
HGB: 16.3 g/dL (ref 13.0–17.1)
LYMPH#: 2.9 10*3/uL (ref 0.9–3.3)
LYMPH%: 29.6 % (ref 14.0–48.0)
MCH: 30.6 pg (ref 28.0–33.4)
MCHC: 34.1 g/dL (ref 32.0–35.9)
MCV: 90 fL (ref 82–98)
MONO#: 0.7 10*3/uL (ref 0.1–0.9)
MONO%: 7.2 % (ref 0.0–13.0)
NEUT#: 5.8 10*3/uL (ref 1.5–6.5)
NEUT%: 59.7 % (ref 40.0–80.0)
Platelets: 270 10*3/uL (ref 145–400)
RBC: 5.32 10*6/uL (ref 4.20–5.70)
RDW: 14.1 % (ref 11.1–15.7)
WBC: 9.7 10*3/uL (ref 4.0–10.0)

## 2016-10-25 MED ORDER — SODIUM CHLORIDE 0.9 % IV SOLN
INTRAVENOUS | Status: DC
  Administered 2016-10-25: 14:00:00 via INTRAVENOUS

## 2016-10-25 NOTE — Progress Notes (Signed)
Robert Tran presents today for phlebotomy per MD orders. Phlebotomy procedure started at 1407 and ended at 1417 with 258 grams removed via IV. MD ordered only 250g to be removed today.  Patient observed for 30 minutes after procedure without any incident. Diet and nutrition offered. Patient tolerated procedure well.

## 2016-10-25 NOTE — Patient Instructions (Signed)
Therapeutic Phlebotomy, Care After  Refer to this sheet in the next few weeks. These instructions provide you with information about caring for yourself after your procedure. Your health care provider may also give you more specific instructions. Your treatment has been planned according to current medical practices, but problems sometimes occur. Call your health care provider if you have any problems or questions after your procedure.  WHAT TO EXPECT AFTER THE PROCEDURE  After your procedure, it is common to have:   Light-headedness or dizziness. You may feel faint.   Nausea.   Tiredness.  HOME CARE INSTRUCTIONS  Activities   Return to your normal activities as directed by your health care provider. Most people can go back to their normal activities right away.   Avoid strenuous physical activity and heavy lifting or pulling for about 5 hours after the procedure. Do not lift anything that is heavier than 10 lb (4.5 kg).   Athletes should avoid strenuous exercise for at least 12 hours.   Change positions slowly for the remainder of the day. This will help to prevent light-headedness or fainting.   If you feel light-headed, lie down until the feeling goes away.  Eating and Drinking   Be sure to eat well-balanced meals for the next 24 hours.   Drink enough fluid to keep your urine clear or pale yellow.   Avoid drinking alcohol on the day that you had the procedure.  Care of the Needle Insertion Site   Keep your bandage dry. You can remove the bandage after about 5 hours or as directed by your health care provider.   If you have bleeding from the needle insertion site, elevate your arm and press firmly on the site until the bleeding stops.   If you have bruising at the site, apply ice to the area:   Put ice in a plastic bag.   Place a towel between your skin and the bag.   Leave the ice on for 20 minutes, 2-3 times a day for the first 24 hours.   If the swelling does not go away after 24 hours, apply  a warm, moist washcloth to the area for 20 minutes, 2-3 times a day.  General Instructions   Avoid smoking for at least 30 minutes after the procedure.   Keep all follow-up visits as directed by your health care provider. It is important to continue with further therapeutic phlebotomy treatments as directed.  SEEK MEDICAL CARE IF:   You have redness, swelling, or pain at the needle insertion site.   You have fluid, blood, or pus coming from the needle insertion site.   You feel light-headed, dizzy, or nauseated, and the feeling does not go away.   You notice new bruising at the needle insertion site.   You feel weaker than normal.   You have a fever or chills.  SEEK IMMEDIATE MEDICAL CARE IF:   You have severe nausea or vomiting.   You have chest pain.   You have trouble breathing.    This information is not intended to replace advice given to you by your health care provider. Make sure you discuss any questions you have with your health care provider.    Document Released: 12/04/2010 Document Revised: 11/16/2014 Document Reviewed: 06/28/2014  Elsevier Interactive Patient Education 2016 [employer]

## 2016-10-25 NOTE — Progress Notes (Signed)
Hematology and Oncology Follow Up Visit  Robert Tran [id number] March 22, 1941 76 y.o. 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 is back for followup. He is doing well today. As always, they were down at the beach. The actually have a new house. They sold her other house. Now they will be busy with their new house fixing it up.  He was last phlebotomized about 3 months ago. He only is half a phlebotomy. He cannot take a full phlebotomy. We also give him fluids afterwards.  He has had no issues with nausea or vomiting. He's had no cough. He is still smoking but trying to cut back.  He is still drinking quite a bit of coffee.  He does not drink alcohol.  His wife is still working.   He has had no fever. He has had no rashes. Back she did have the pneumonia around Christmas time.   His last iron studies back in January showed a ferritin of only 12 with iron saturation of 35%. We last saw him back in December before Christmas. At that time, he did have a problem with sinusitis. He got over this period   He has had no problems otherwise. He's had no cough or shortness of breath. He's had no nausea or vomiting. He's had no rashes. He's had no change in bowel or bladder habits.   He was last phlebotomized in December.  He is on come back on tobacco use. He drinks one cup of coffee a day now.   He is still working. His wife is still working.   Overall, his performance status is ECOG 1.  Medications:  Current Outpatient Prescriptions:  .  amLODipine (NORVASC) 10 MG tablet, Take 10 mg by mouth daily., Disp: , Rfl:  .  Cholecalciferol (VITAMIN D) 2000 UNITS tablet, Take 5,000 Units by mouth daily. 03/08/15 taking 5000 u, Disp: , Rfl:  .  clopidogrel (PLAVIX) 75 MG tablet, Take 75 mg by mouth daily., Disp: , Rfl:  .  Folic Acid 841  MCG TABS, Take 400 mcg by mouth every morning., Disp: , Rfl:  .  metFORMIN (GLUCOPHAGE-XR) 500 MG 24 hr tablet, Take 500 mg by mouth., Disp: , Rfl:  .  ramipril (ALTACE) 10 MG tablet, Take 10 mg by mouth daily., Disp: , Rfl:  .  torsemide (DEMADEX) 20 MG tablet, Take 20 mg by mouth as needed., Disp: , Rfl:   Allergies:  Allergies  Allergen Reactions  . Sulfa Drugs Cross Reactors     Dizziness, nausea  . Minocycline Nausea Only  . Pravastatin Sodium Other (See Comments)    Past Medical History, Surgical history, Social history, and Family History were reviewed and updated.  Review of Systems: As above  Physical Exam:  weight is 172 lb 12.8 oz (78.4 kg). His oral temperature is 98.1 F (36.7 C). His blood pressure is 127/62 and his pulse is 86. His respiration is 18 and oxygen saturation is 98%.   Well-developed and well-nourished gentleman in no obvious distress. Head and neck exam shows no ocular or oral lesions.He has a lot of tenderness over the maxillary sinuses. He has more tenderness over the left sinus. Actually looked up his nasal passages. He has some congestion and inflammation of the mucosa. He has no scleral icterus. There is no conjunctival  inflammation. Neck is supple with no adenopathy. Lungs are clear. Cardiac exam regular rate and rhythm with no murmurs rubs or bruits. Abdomen is soft. He has good bowel sounds. There is no fluid wave. There is no palpable liver or spleen tip. Back exam shows no tenderness over the spine ribs or hips. Extremities shows no clubbing cyanosis or edema. Neurological exam shows no focal neurological deficits. Skin exam shows a ruddy complexion.  Lab Results  Component Value Date   WBC 9.7 10/25/2016   HGB 16.3 10/25/2016   HCT 47.8 10/25/2016   MCV 90 10/25/2016   PLT 270 10/25/2016     Chemistry      Component Value Date/Time   NA 134 (L) 08/09/2016 1313   K 4.2 08/09/2016 1313   CL 104 06/28/2016 1344   CO2 20 (L) 08/09/2016 1313    BUN 19.9 08/09/2016 1313   CREATININE 1.3 08/09/2016 1313      Component Value Date/Time   CALCIUM 10.0 08/09/2016 1313   ALKPHOS 75 08/09/2016 1313   AST 14 08/09/2016 1313   ALT 23 08/09/2016 1313   BILITOT 0.34 08/09/2016 1313         Impression and Plan: Robert Tran is 76 year old gentleman with polycythemia. He's doing well. We will definitely phlebotomize him today. He is very sensitive to phlebotomies. As such, we will go ahead and take off half a bag of blood and then give him back 500 mL of fluid.  I think we can get him back in about 3 months. I really would like to get his hematocrit below 45%. However, he just has a hard time with phlebotomies.   It sounds like everything is going well with the new house. As always, he his wife really love to do remodeling and other improvements on their homes.  Robert Napoleon, MD 4/12/20182:06 PM

## 2016-10-26 LAB — FERRITIN: Ferritin: 11 ng/ml — ABNORMAL LOW (ref 22–316)

## 2016-10-26 LAB — IRON AND TIBC
%SAT: 21 % (ref 20–55)
Iron: 77 ug/dL (ref 42–163)
TIBC: 373 ug/dL (ref 202–409)
UIBC: 296 ug/dL (ref 117–376)

## 2017-01-24 ENCOUNTER — Other Ambulatory Visit (HOSPITAL_BASED_OUTPATIENT_CLINIC_OR_DEPARTMENT_OTHER): Payer: Medicare Other

## 2017-01-24 ENCOUNTER — Ambulatory Visit (HOSPITAL_BASED_OUTPATIENT_CLINIC_OR_DEPARTMENT_OTHER): Payer: Medicare Other

## 2017-01-24 ENCOUNTER — Ambulatory Visit (HOSPITAL_BASED_OUTPATIENT_CLINIC_OR_DEPARTMENT_OTHER): Payer: Medicare Other | Admitting: Family

## 2017-01-24 VITALS — BP 144/64 | HR 90 | Resp 16

## 2017-01-24 VITALS — BP 136/63 | HR 88 | Temp 97.8°F | Resp 18 | Wt 171.0 lb

## 2017-01-24 DIAGNOSIS — D500 Iron deficiency anemia secondary to blood loss (chronic): Secondary | ICD-10-CM

## 2017-01-24 DIAGNOSIS — D45 Polycythemia vera: Secondary | ICD-10-CM

## 2017-01-24 LAB — CBC WITH DIFFERENTIAL (CANCER CENTER ONLY)
BASO#: 0.1 10*3/uL (ref 0.0–0.2)
BASO%: 0.5 % (ref 0.0–2.0)
EOS%: 2.7 % (ref 0.0–7.0)
Eosinophils Absolute: 0.3 10*3/uL (ref 0.0–0.5)
HEMATOCRIT: 51.3 % — AB (ref 38.7–49.9)
HGB: 17.7 g/dL — ABNORMAL HIGH (ref 13.0–17.1)
LYMPH#: 3.1 10*3/uL (ref 0.9–3.3)
LYMPH%: 31.1 % (ref 14.0–48.0)
MCH: 31.2 pg (ref 28.0–33.4)
MCHC: 34.5 g/dL (ref 32.0–35.9)
MCV: 90 fL (ref 82–98)
MONO#: 0.7 10*3/uL (ref 0.1–0.9)
MONO%: 7 % (ref 0.0–13.0)
NEUT#: 5.9 10*3/uL (ref 1.5–6.5)
NEUT%: 58.7 % (ref 40.0–80.0)
PLATELETS: 260 10*3/uL (ref 145–400)
RBC: 5.68 10*6/uL (ref 4.20–5.70)
RDW: 14.5 % (ref 11.1–15.7)
WBC: 10 10*3/uL (ref 4.0–10.0)

## 2017-01-24 LAB — CMP (CANCER CENTER ONLY)
ALT(SGPT): 32 U/L (ref 10–47)
AST: 16 U/L (ref 11–38)
Albumin: 3.6 g/dL (ref 3.3–5.5)
Alkaline Phosphatase: 71 U/L (ref 26–84)
BILIRUBIN TOTAL: 0.6 mg/dL (ref 0.20–1.60)
BUN, Bld: 18 mg/dL (ref 7–22)
CALCIUM: 10.2 mg/dL (ref 8.0–10.3)
CO2: 25 meq/L (ref 18–33)
Chloride: 103 mEq/L (ref 98–108)
Creat: 1.3 mg/dl — ABNORMAL HIGH (ref 0.6–1.2)
GLUCOSE: 191 mg/dL — AB (ref 73–118)
Potassium: 4 mEq/L (ref 3.3–4.7)
SODIUM: 136 meq/L (ref 128–145)
Total Protein: 7 g/dL (ref 6.4–8.1)

## 2017-01-24 LAB — CHCC SATELLITE - SMEAR

## 2017-01-24 MED ORDER — SODIUM CHLORIDE 0.9 % IV SOLN
INTRAVENOUS | Status: DC
  Administered 2017-01-24: 15:00:00 via INTRAVENOUS

## 2017-01-24 NOTE — Progress Notes (Signed)
Hematology and Oncology Follow Up Visit  Robert Tran [id number] Feb 03, 1941 77 y.o. 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 His complexion is [place] and he is still smoking. His Hct at this time is 51.3. He really does not like to be phlebotomized due to the fatigue after despite replacement fluids. He only likes to have a many phlebotomy (250 ml) and fluids after. We discussed doing 1 mini phlebotomy a week for 3 weeks to get his counts down but he just does not want to do this. He did agree to come back in 6 weeks.  He has occasional fatigue but will take breaks and rest as needed.  No episodes of bleeding, no petechiae. He does bruise easily on Plavix which he states he is taking as prescribed.  His VSS. No fever, chills, n/v, cough, rash, dizziness, chest pain, palpitations, abdominal pain or changes in bowel or bladder habits.  His SOB with over exertion is unchanged from baseline.  No swelling, tenderness, numbness or tingling in his extremities. No c/o pain.  He has maintained a good appetite and is staying well hydrated. Her weight is stable.   ECOG Performance Status: 1 - Symptomatic but completely ambulatory  Medications:  Allergies as of 01/24/2017      Reactions   Sulfa Drugs Cross Reactors    Dizziness, nausea   Minocycline Nausea Only   Pravastatin Sodium Other (See Comments)      Medication List       Accurate as of 01/24/17  2:40 PM. Always use your most recent med list.          amLODipine 10 MG tablet Commonly known as:  NORVASC Take 10 mg by mouth daily.   Folic Acid 740 MCG Tabs Take 400 mcg by mouth every morning.   metFORMIN 500 MG 24 hr tablet Commonly known as:  GLUCOPHAGE-XR Take 500 mg by mouth.   PLAVIX 75 MG tablet Generic drug:  clopidogrel Take 75 mg by  mouth daily.   ramipril 10 MG tablet Commonly known as:  ALTACE Take 10 mg by mouth daily.   torsemide 20 MG tablet Commonly known as:  DEMADEX Take 20 mg by mouth as needed.   Vitamin D 2000 units tablet Take 5,000 Units by mouth daily. 03/08/15 taking 5000 u       Allergies:  Allergies  Allergen Reactions  . Sulfa Drugs Cross Reactors     Dizziness, nausea  . Minocycline Nausea Only  . Pravastatin Sodium Other (See Comments)    Past Medical History, Surgical history, Social history, and Family History were reviewed and updated.  Review of Systems: All other 10 point review of systems is negative.   Physical Exam:  weight is 171 lb (77.6 kg). His oral temperature is 97.8 F (36.6 C). His blood pressure is 136/63 and his pulse is 88. His respiration is 18 and oxygen saturation is 95%.   Wt Readings from Last 3 Encounters:  01/24/17 171 lb (77.6 kg)  10/25/16 172 lb 12.8 oz (78.4 kg)  08/09/16 174 lb (78.9 kg)    Ocular: Sclerae unicteric, pupils equal, round and reactive to light Ear-nose-throat: Oropharynx clear, dentition fair Lymphatic: No cervical, supraclavicular or axillary adenopathy Lungs no rales or rhonchi, good excursion bilaterally Heart regular rate and rhythm, no murmur appreciated  Abd soft, nontender, positive bowel sounds, no liver or spleen tip palpated on exam, no fluid wave MSK no focal spinal tenderness, no joint edema Neuro: non-focal, well-oriented, appropriate affect Breasts: Deferred   Lab Results  Component Value Date   WBC 10.0 01/24/2017   HGB 17.7 (H) 01/24/2017   HCT 51.3 (H) 01/24/2017   MCV 90 01/24/2017   PLT 260 01/24/2017   Lab Results  Component Value Date   FERRITIN 11 (L) 10/25/2016   IRON 77 10/25/2016   TIBC 373 10/25/2016   UIBC 296 10/25/2016   IRONPCTSAT 21 10/25/2016   Lab Results  Component Value Date   RETICCTPCT 1.4 06/20/2015   RBC 5.68 01/24/2017   RETICCTABS 77.3 06/20/2015   No results found for:  KPAFRELGTCHN, LAMBDASER, KAPLAMBRATIO No results found for: IGGSERUM, IGA, IGMSERUM No results found for: [name], SPEI   Chemistry      Component Value Date/Time   NA 136 01/24/2017 1335   NA 134 (L) 08/09/2016 1313   K 4.0 01/24/2017 1335   K 4.2 08/09/2016 1313   CL 103 01/24/2017 1335   CO2 25 01/24/2017 1335   CO2 20 (L) 08/09/2016 1313   BUN 18 01/24/2017 1335   BUN 19.9 08/09/2016 1313   CREATININE 1.3 (H) 01/24/2017 1335   CREATININE 1.3 08/09/2016 1313      Component Value Date/Time   CALCIUM 10.2 01/24/2017 1335   CALCIUM 10.0 08/09/2016 1313   ALKPHOS 71 01/24/2017 1335   ALKPHOS 75 08/09/2016 1313   AST 16 01/24/2017 1335   AST 14 08/09/2016 1313   ALT 32 01/24/2017 1335   ALT 23 08/09/2016 1313   BILITOT 0.60 01/24/2017 1335   BILITOT 0.34 08/09/2016 1313      Impression and Plan: Robert Tran is a very pleasant 76 yo caucasian gentleman with secondary polycythemia. He is still smoking. We discussed him cutting back and he states that he will try. He states that he is staying well hydrated.  He verbalized that he is taking his Plavix 75 mg PO daily as prescribed.  As mentioned above, he really does not like to have phlebotomies. We will do a mini phlebotomy today with replacement fluids. He agreed to come back in 6 weeks for repeat lab work, follow-up and another phlebotomy if needed.  Both he and his family know top contact our office with any questions or concerns. We can certainly see him sooner if need be.   Eliezer Bottom, NP 7/12/20182:40 PM

## 2017-01-24 NOTE — Progress Notes (Signed)
Robert Tran presents today for phlebotomy per MD orders. Phlebotomy procedure started at 1425 and ended at 1440. 273 grams removed using 18g IV cath from rt [facility].  Patient observed for 30 minutes after procedure without any incident. Patient tolerated procedure well. IV needle removed intact.

## 2017-01-24 NOTE — Patient Instructions (Signed)
Therapeutic Phlebotomy Therapeutic phlebotomy is the controlled removal of blood from a person's body for the purpose of treating a medical condition. The procedure is similar to donating blood. Usually, about a pint (470 mL, or 0.47L) of blood is removed. The average adult has 9-12 pints (4.3-5.7 L) of blood. Therapeutic phlebotomy may be used to treat the following medical conditions:  Hemochromatosis. This is a condition in which the blood contains too much iron.  Polycythemia vera. This is a condition in which the blood contains too many red blood cells.  Porphyria cutanea tarda. This is a disease in which an important part of hemoglobin is not made properly. It results in the buildup of abnormal amounts of porphyrins in the body.  Sickle cell disease. This is a condition in which the red blood cells form an abnormal crescent shape rather than a round shape.  Tell a health care provider about:  Any allergies you have.  All medicines you are taking, including vitamins, herbs, eye drops, creams, and over-the-counter medicines.  Any problems you or family members have had with anesthetic medicines.  Any blood disorders you have.  Any surgeries you have had.  Any medical conditions you have. What are the risks? Generally, this is a safe procedure. However, problems may occur, including:  Nausea or light-headedness.  Low blood pressure.  Soreness, bleeding, swelling, or bruising at the needle insertion site.  Infection.  What happens before the procedure?  Follow instructions from your health care provider about eating or drinking restrictions.  Ask your health care provider about changing or stopping your regular medicines. This is especially important if you are taking diabetes medicines or blood thinners.  Wear clothing with sleeves that can be raised above the elbow.  Plan to have someone take you home after the procedure.  You may have a blood sample taken. What  happens during the procedure?  A needle will be inserted into one of your veins.  Tubing and a collection bag will be attached to that needle.  Blood will flow through the needle and tubing into the collection bag.  You may be asked to open and close your hand slowly and continually during the entire collection.  After the specified amount of blood has been removed from your body, the collection bag and tubing will be clamped.  The needle will be removed from your vein.  Pressure will be held on the site of the needle insertion to stop the bleeding.  A bandage (dressing) will be placed over the needle insertion site. The procedure may vary among health care providers and hospitals. What happens after the procedure?  Your recovery will be assessed and monitored.  You can return to your normal activities as directed by your health care provider. This information is not intended to replace advice given to you by your health care provider. Make sure you discuss any questions you have with your health care provider. Document Released: 12/04/2010 Document Revised: 03/03/2016 Document Reviewed: 06/28/2014 Elsevier Interactive Patient Education  2018 Elsevier Inc.  

## 2017-01-25 LAB — IRON AND TIBC
%SAT: 31 % (ref 20–55)
IRON: 114 ug/dL (ref 42–163)
TIBC: 364 ug/dL (ref 202–409)
UIBC: 250 ug/dL (ref 117–376)

## 2017-01-25 LAB — RETICULOCYTES: Reticulocyte Count: 1.4 % (ref 0.6–2.6)

## 2017-01-25 LAB — FERRITIN: Ferritin: 19 ng/ml — ABNORMAL LOW (ref 22–316)

## 2017-03-06 ENCOUNTER — Ambulatory Visit (HOSPITAL_BASED_OUTPATIENT_CLINIC_OR_DEPARTMENT_OTHER): Payer: Medicare Other | Admitting: Hematology & Oncology

## 2017-03-06 ENCOUNTER — Other Ambulatory Visit (HOSPITAL_BASED_OUTPATIENT_CLINIC_OR_DEPARTMENT_OTHER): Payer: Medicare Other

## 2017-03-06 ENCOUNTER — Ambulatory Visit (HOSPITAL_BASED_OUTPATIENT_CLINIC_OR_DEPARTMENT_OTHER): Payer: Medicare Other

## 2017-03-06 VITALS — BP 117/65 | HR 90 | Resp 20

## 2017-03-06 VITALS — BP 129/60 | HR 90 | Temp 97.6°F | Resp 19 | Wt 171.0 lb

## 2017-03-06 DIAGNOSIS — Z720 Tobacco use: Secondary | ICD-10-CM

## 2017-03-06 DIAGNOSIS — D500 Iron deficiency anemia secondary to blood loss (chronic): Secondary | ICD-10-CM | POA: Diagnosis not present

## 2017-03-06 DIAGNOSIS — D45 Polycythemia vera: Secondary | ICD-10-CM

## 2017-03-06 LAB — CMP (CANCER CENTER ONLY)
ALK PHOS: 75 U/L (ref 26–84)
ALT: 15 U/L (ref 10–47)
AST: 18 U/L (ref 11–38)
Albumin: 3.7 g/dL (ref 3.3–5.5)
BUN: 18 mg/dL (ref 7–22)
CO2: 26 mEq/L (ref 18–33)
Calcium: 9.4 mg/dL (ref 8.0–10.3)
Chloride: 108 mEq/L (ref 98–108)
Creat: 1.2 mg/dl (ref 0.6–1.2)
GLUCOSE: 228 mg/dL — AB (ref 73–118)
POTASSIUM: 4.2 meq/L (ref 3.3–4.7)
SODIUM: 139 meq/L (ref 128–145)
Total Bilirubin: 0.7 mg/dl (ref 0.20–1.60)
Total Protein: 6.9 g/dL (ref 6.4–8.1)

## 2017-03-06 LAB — CBC WITH DIFFERENTIAL (CANCER CENTER ONLY)
BASO#: 0.1 10*3/uL (ref 0.0–0.2)
BASO%: 0.6 % (ref 0.0–2.0)
EOS ABS: 0.2 10*3/uL (ref 0.0–0.5)
EOS%: 2.4 % (ref 0.0–7.0)
HEMATOCRIT: 49.7 % (ref 38.7–49.9)
HEMOGLOBIN: 17.1 g/dL (ref 13.0–17.1)
LYMPH#: 2.2 10*3/uL (ref 0.9–3.3)
LYMPH%: 22.9 % (ref 14.0–48.0)
MCH: 31 pg (ref 28.0–33.4)
MCHC: 34.4 g/dL (ref 32.0–35.9)
MCV: 90 fL (ref 82–98)
MONO#: 0.6 10*3/uL (ref 0.1–0.9)
MONO%: 6.2 % (ref 0.0–13.0)
NEUT%: 67.9 % (ref 40.0–80.0)
NEUTROS ABS: 6.5 10*3/uL (ref 1.5–6.5)
PLATELETS: 261 10*3/uL (ref 145–400)
RBC: 5.51 10*6/uL (ref 4.20–5.70)
RDW: 14.2 % (ref 11.1–15.7)
WBC: 9.5 10*3/uL (ref 4.0–10.0)

## 2017-03-06 LAB — IRON AND TIBC
%SAT: 30 % (ref 20–55)
Iron: 104 ug/dL (ref 42–163)
TIBC: 348 ug/dL (ref 202–409)
UIBC: 243 ug/dL (ref 117–376)

## 2017-03-06 LAB — CHCC SATELLITE - SMEAR

## 2017-03-06 LAB — FERRITIN: Ferritin: 16 ng/ml — ABNORMAL LOW (ref 22–316)

## 2017-03-06 MED ORDER — SODIUM CHLORIDE 0.9 % IV SOLN
INTRAVENOUS | Status: AC
  Administered 2017-03-06: 11:00:00 via INTRAVENOUS

## 2017-03-06 NOTE — Progress Notes (Signed)
Hematology and Oncology Follow Up Visit  Robert Tran [id number] 12-Jan-1941 76 y.o. 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 We see him every 6 weeks. He typically gets phlebotomized whenever we see him.  He and his wife are very busy down at the [facility]. 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 As such, they're very busy.  He is still smoking. He is still drinking a lot of coffee. He is trying to cut back on the coffee.  He is watching what he eats. He does have some high blood sugars.  He's had no cough. He's had no shortness of breath. He's had no change in bowel or bladder habits. He's had no rashes. He's had no headache. Thankfully, there has been no neurological issues. He is on Plavix and doing well.  Overall, his performance status is ECOG 1.  Medications:  Allergies as of 03/06/2017      Reactions   Sulfa Drugs Cross Reactors    Dizziness, nausea   Minocycline Nausea Only   Pravastatin Sodium Other (See Comments)      Medication List       Accurate as of 03/06/17  9:52 AM. Always use your most recent med list.          amLODipine 10 MG tablet Commonly known as:  NORVASC Take 10 mg by mouth daily.   Folic Acid 124 MCG Tabs Take 400 mcg by mouth every morning.   metFORMIN 500 MG 24 hr tablet Commonly known as:  GLUCOPHAGE-XR Take 500 mg by mouth.   PLAVIX 75 MG tablet Generic drug:  clopidogrel Take 75 mg by mouth daily.   ramipril 10 MG tablet Commonly known as:  ALTACE Take 10 mg by mouth daily.   torsemide 20 MG tablet Commonly known as:  DEMADEX Take 20 mg by mouth as needed.   Vitamin D 2000 units tablet Take 5,000 Units by mouth daily. 03/08/15 taking 5000 u       Allergies:  Allergies  Allergen Reactions  . Sulfa Drugs Cross Reactors      Dizziness, nausea  . Minocycline Nausea Only  . Pravastatin Sodium Other (See Comments)    Past Medical History, Surgical history, Social history, and Family History were reviewed and updated.  Review of Systems: All other 10 point review of systems is negative.   Physical Exam:  weight is 171 lb (77.6 kg). His oral temperature is 97.6 F (36.4 C). His blood pressure is 129/60 and his pulse is 90. His respiration is 19 and oxygen saturation is 96%.   Wt Readings from Last 3 Encounters:  03/06/17 171 lb (77.6 kg)  01/24/17 171 lb (77.6 kg)  10/25/16 172 lb 12.8 oz (78.4 kg)    Well-developed and well-nourished white male in no obvious distress. Head and neck exam shows no ocular or oral lesions. He has no conjunctival inflammation. There is no adenopathy in the neck. Thyroid is nonpalpable. Lungs are clear. Cardiac exam shows a regular rate and rhythm with no murmurs, rubs or bruits. Abdomen is soft. He is mildly obese. He has no fluid wave. There is no palpable liver or spleen tip. Back exam shows no tenderness over the spine, ribs or hips. Extremities shows no clubbing, cyanosis or edema. Has good range of motion  of his joints. He has no tenderness over his joints. There is no swelling over his joints. Skin exam shows no rashes, ecchymoses or petechia. Neurological exam shows no focal neurological deficits.  Lab Results  Component Value Date   WBC 9.5 03/06/2017   HGB 17.1 03/06/2017   HCT 49.7 03/06/2017   MCV 90 03/06/2017   PLT 261 03/06/2017   Lab Results  Component Value Date   FERRITIN 19 (L) 01/24/2017   IRON 114 01/24/2017   TIBC 364 01/24/2017   UIBC 250 01/24/2017   IRONPCTSAT 31 01/24/2017   Lab Results  Component Value Date   RETICCTPCT 1.4 06/20/2015   RBC 5.51 03/06/2017   RETICCTABS 77.3 06/20/2015   No results found for: KPAFRELGTCHN, LAMBDASER, KAPLAMBRATIO No results found for: Kandis Cocking, IGMSERUM No results found for: [name], SPEI   Chemistry      Component Value Date/Time   NA 139 03/06/2017 0851   NA 134 (L) 08/09/2016 1313   K 4.2 03/06/2017 0851   K 4.2 08/09/2016 1313   CL 108 03/06/2017 0851   CO2 26 03/06/2017 0851   CO2 20 (L) 08/09/2016 1313   BUN 18 03/06/2017 0851   BUN 19.9 08/09/2016 1313   CREATININE 1.2 03/06/2017 0851   CREATININE 1.3 08/09/2016 1313      Component Value Date/Time   CALCIUM 9.4 03/06/2017 0851   CALCIUM 10.0 08/09/2016 1313   ALKPHOS 75 03/06/2017 0851   ALKPHOS 75 08/09/2016 1313   AST 18 03/06/2017 0851   AST 14 08/09/2016 1313   ALT 15 03/06/2017 0851   ALT 23 08/09/2016 1313   BILITOT 0.70 03/06/2017 0851   BILITOT 0.34 08/09/2016 1313      Impression and Plan: Robert Tran is A 76 year old male. He has polycythemia. 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 However, phlebotomizing him definitely makes him feel better.  We will go ahead and phlebotomize him today. We actually take off a little less than 1 unit and then give him back IV fluids. This really helps him. 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  We talked about his coffee use. We talked about his smoking. He understands the consequences of this with his blood.  We will get him back in 6 weeks. This works best for him.    Volanda Napoleon, MD 8/22/20189:52 AM

## 2017-03-06 NOTE — Patient Instructions (Signed)
Therapeutic Phlebotomy Therapeutic phlebotomy is the controlled removal of blood from a person's body for the purpose of treating a medical condition. The procedure is similar to donating blood. Usually, about a pint (470 mL, or 0.47L) of blood is removed. The average adult has 9-12 pints (4.3-5.7 L) of blood. Therapeutic phlebotomy may be used to treat the following medical conditions:  Hemochromatosis. This is a condition in which the blood contains too much iron.  Polycythemia vera. This is a condition in which the blood contains too many red blood cells.  Porphyria cutanea tarda. This is a disease in which an important part of hemoglobin is not made properly. It results in the buildup of abnormal amounts of porphyrins in the body.  Sickle cell disease. This is a condition in which the red blood cells form an abnormal crescent shape rather than a round shape.  Tell a health care provider about:  Any allergies you have.  All medicines you are taking, including vitamins, herbs, eye drops, creams, and over-the-counter medicines.  Any problems you or family members have had with anesthetic medicines.  Any blood disorders you have.  Any surgeries you have had.  Any medical conditions you have. What are the risks? Generally, this is a safe procedure. However, problems may occur, including:  Nausea or light-headedness.  Low blood pressure.  Soreness, bleeding, swelling, or bruising at the needle insertion site.  Infection.  What happens before the procedure?  Follow instructions from your health care provider about eating or drinking restrictions.  Ask your health care provider about changing or stopping your regular medicines. This is especially important if you are taking diabetes medicines or blood thinners.  Wear clothing with sleeves that can be raised above the elbow.  Plan to have someone take you home after the procedure.  You may have a blood sample taken. What  happens during the procedure?  A needle will be inserted into one of your veins.  Tubing and a collection bag will be attached to that needle.  Blood will flow through the needle and tubing into the collection bag.  You may be asked to open and close your hand slowly and continually during the entire collection.  After the specified amount of blood has been removed from your body, the collection bag and tubing will be clamped.  The needle will be removed from your vein.  Pressure will be held on the site of the needle insertion to stop the bleeding.  A bandage (dressing) will be placed over the needle insertion site. The procedure may vary among health care providers and hospitals. What happens after the procedure?  Your recovery will be assessed and monitored.  You can return to your normal activities as directed by your health care provider. This information is not intended to replace advice given to you by your health care provider. Make sure you discuss any questions you have with your health care provider. Document Released: 12/04/2010 Document Revised: 03/03/2016 Document Reviewed: 06/28/2014 Elsevier Interactive Patient Education  2018 Elsevier Inc.  

## 2017-05-01 ENCOUNTER — Other Ambulatory Visit: Payer: Medicare Other

## 2017-05-01 ENCOUNTER — Ambulatory Visit: Payer: Medicare Other | Admitting: Hematology & Oncology

## 2017-05-20 DIAGNOSIS — E7800 Pure hypercholesterolemia, unspecified: Secondary | ICD-10-CM

## 2017-05-20 DIAGNOSIS — D649 Anemia, unspecified: Secondary | ICD-10-CM

## 2017-05-20 DIAGNOSIS — K922 Gastrointestinal hemorrhage, unspecified: Secondary | ICD-10-CM

## 2017-05-20 DIAGNOSIS — I10 Essential (primary) hypertension: Secondary | ICD-10-CM | POA: Diagnosis not present

## 2017-05-21 DIAGNOSIS — D649 Anemia, unspecified: Secondary | ICD-10-CM | POA: Diagnosis not present

## 2017-05-21 DIAGNOSIS — E7800 Pure hypercholesterolemia, unspecified: Secondary | ICD-10-CM | POA: Diagnosis not present

## 2017-05-21 DIAGNOSIS — K922 Gastrointestinal hemorrhage, unspecified: Secondary | ICD-10-CM | POA: Diagnosis not present

## 2017-05-21 DIAGNOSIS — I10 Essential (primary) hypertension: Secondary | ICD-10-CM | POA: Diagnosis not present

## 2017-05-22 DIAGNOSIS — I10 Essential (primary) hypertension: Secondary | ICD-10-CM | POA: Diagnosis not present

## 2017-05-22 DIAGNOSIS — E7800 Pure hypercholesterolemia, unspecified: Secondary | ICD-10-CM | POA: Diagnosis not present

## 2017-05-22 DIAGNOSIS — K922 Gastrointestinal hemorrhage, unspecified: Secondary | ICD-10-CM | POA: Diagnosis not present

## 2017-05-22 DIAGNOSIS — D649 Anemia, unspecified: Secondary | ICD-10-CM | POA: Diagnosis not present

## 2017-05-30 ENCOUNTER — Telehealth: Payer: Self-pay | Admitting: Hematology & Oncology

## 2017-05-30 NOTE — Telephone Encounter (Signed)
Faxed medical records to: [facility] for SHAYDEN BOBIER May 25, 1941      COPY SCANNED

## 2017-06-26 DIAGNOSIS — G8929 Other chronic pain: Secondary | ICD-10-CM | POA: Insufficient documentation

## 2017-06-26 DIAGNOSIS — D500 Iron deficiency anemia secondary to blood loss (chronic): Secondary | ICD-10-CM | POA: Insufficient documentation

## 2017-07-16 HISTORY — PX: COLONOSCOPY: SHX174

## 2017-07-18 ENCOUNTER — Other Ambulatory Visit (HOSPITAL_BASED_OUTPATIENT_CLINIC_OR_DEPARTMENT_OTHER): Payer: Medicare Other

## 2017-07-18 ENCOUNTER — Ambulatory Visit (HOSPITAL_BASED_OUTPATIENT_CLINIC_OR_DEPARTMENT_OTHER): Payer: Medicare Other | Admitting: Hematology & Oncology

## 2017-07-18 ENCOUNTER — Other Ambulatory Visit: Payer: Self-pay | Admitting: *Deleted

## 2017-07-18 VITALS — BP 129/68 | HR 93 | Temp 97.6°F | Wt 172.0 lb

## 2017-07-18 DIAGNOSIS — D45 Polycythemia vera: Secondary | ICD-10-CM

## 2017-07-18 DIAGNOSIS — Z720 Tobacco use: Secondary | ICD-10-CM

## 2017-07-18 DIAGNOSIS — D509 Iron deficiency anemia, unspecified: Secondary | ICD-10-CM | POA: Diagnosis not present

## 2017-07-18 DIAGNOSIS — K5731 Diverticulosis of large intestine without perforation or abscess with bleeding: Secondary | ICD-10-CM

## 2017-07-18 LAB — CBC WITH DIFFERENTIAL (CANCER CENTER ONLY)
BASO#: 0.1 10*3/uL (ref 0.0–0.2)
BASO%: 0.8 % (ref 0.0–2.0)
EOS%: 1.8 % (ref 0.0–7.0)
Eosinophils Absolute: 0.2 10*3/uL (ref 0.0–0.5)
HEMATOCRIT: 39.9 % (ref 38.7–49.9)
HEMOGLOBIN: 12.5 g/dL — AB (ref 13.0–17.1)
LYMPH#: 2.3 10*3/uL (ref 0.9–3.3)
LYMPH%: 26.3 % (ref 14.0–48.0)
MCH: 25.8 pg — ABNORMAL LOW (ref 28.0–33.4)
MCHC: 31.3 g/dL — ABNORMAL LOW (ref 32.0–35.9)
MCV: 82 fL (ref 82–98)
MONO#: 0.5 10*3/uL (ref 0.1–0.9)
MONO%: 6.2 % (ref 0.0–13.0)
NEUT%: 64.9 % (ref 40.0–80.0)
NEUTROS ABS: 5.7 10*3/uL (ref 1.5–6.5)
Platelets: 368 10*3/uL (ref 145–400)
RBC: 4.85 10*6/uL (ref 4.20–5.70)
RDW: 14.9 % (ref 11.1–15.7)
WBC: 8.8 10*3/uL (ref 4.0–10.0)

## 2017-07-18 LAB — CMP (CANCER CENTER ONLY)
ALBUMIN: 3.7 g/dL (ref 3.3–5.5)
ALK PHOS: 65 U/L (ref 26–84)
ALT: 21 U/L (ref 10–47)
AST: 15 U/L (ref 11–38)
BILIRUBIN TOTAL: 0.5 mg/dL (ref 0.20–1.60)
BUN, Bld: 19 mg/dL (ref 7–22)
CALCIUM: 10.2 mg/dL (ref 8.0–10.3)
CO2: 29 mEq/L (ref 18–33)
Chloride: 103 mEq/L (ref 98–108)
Creat: 1.4 mg/dl — ABNORMAL HIGH (ref 0.6–1.2)
GLUCOSE: 282 mg/dL — AB (ref 73–118)
POTASSIUM: 4 meq/L (ref 3.3–4.7)
Sodium: 142 mEq/L (ref 128–145)
Total Protein: 7 g/dL (ref 6.4–8.1)

## 2017-07-18 MED ORDER — MECLIZINE HCL 25 MG PO TABS: 25.0000 mg | ORAL_TABLET | Freq: Three times a day (TID) | ORAL | 1 refills | Status: DC | PRN

## 2017-07-18 MED ORDER — MECLIZINE HCL 32 MG PO TABS: 32.0000 mg | ORAL_TABLET | Freq: Three times a day (TID) | ORAL | 1 refills | Status: DC | PRN

## 2017-07-18 NOTE — Progress Notes (Signed)
Hematology and Oncology Follow Up Visit  Robert Tran [id number] [birth year] 77 y.o. 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  Surprisingly enough, he had a lower GI bleed back in October.  It sounds like he had diverticulosis.  He was on Plavix.  He thinks that the antibiotic that was put on for this abscess in his groin was the cause.  I told him that I do not think that was the reason.  He was hospitalized.  He was not transfused.  His wife said that his hemoglobin went down to 9 or 10.  He still feels quite tired.  I am sure that he probably is quite iron deficient.  We are checking his iron studies today.  He has not really been able to go back to work.  He just feels too fatigued.  His appetite is doing well.  He is still smoking.  He still drinking coffee.  Overall, his performance status is ECOG 1.    Medications:  Allergies as of 07/18/2017      Reactions   Sulfa Drugs Cross Reactors    Dizziness, nausea   Minocycline Nausea Only   Pravastatin Sodium Other (See Comments)      Medication List        Accurate as of 07/18/17  4:11 PM. Always use your most recent med list.          amLODipine 10 MG tablet Commonly known as:  NORVASC Take 10 mg by mouth daily.   Folic Acid 562 MCG Tabs Take 400 mcg by mouth every morning.   meclizine 25 MG tablet Commonly known as:  ANTIVERT Take 1 tablet (25 mg total) by mouth 3 (three) times daily as needed.   metFORMIN 500 MG 24 hr tablet Commonly known as:  GLUCOPHAGE-XR Take 500 mg by mouth.   PLAVIX 75 MG tablet Generic drug:  clopidogrel Take 75 mg by mouth daily.   ramipril 10 MG tablet Commonly known as:  ALTACE Take 10 mg by mouth daily.   torsemide 20 MG tablet Commonly known as:  DEMADEX Take 20 mg by mouth as needed.   Vitamin D  2000 units tablet Take 5,000 Units by mouth daily. 03/08/15 taking 5000 u       Allergies:  Allergies  Allergen Reactions  . Sulfa Drugs Cross Reactors     Dizziness, nausea  . Minocycline Nausea Only  . Pravastatin Sodium Other (See Comments)    Past Medical History, Surgical history, Social history, and Family History were reviewed and updated.  Review of Systems: As stated in the interim history  Physical Exam:  weight is 172 lb (78 kg). His oral temperature is 97.6 F (36.4 C). His blood pressure is 129/68 and his pulse is 93.   Wt Readings from Last 3 Encounters:  07/18/17 172 lb (78 kg)  03/06/17 171 lb (77.6 kg)  01/24/17 171 lb (77.6 kg)    Physical Exam  Constitutional: He is oriented to person, place, and time.  HENT:  Head: Normocephalic and atraumatic.  Mouth/Throat: Oropharynx is clear and moist.  Eyes: EOM are normal. Pupils are equal, round, and reactive to light.  Neck: Normal range of motion.  Cardiovascular: Normal rate, regular rhythm and normal heart sounds.  Pulmonary/Chest: Effort normal and breath sounds normal.  Abdominal: Soft.  Bowel sounds are normal.  Musculoskeletal: Normal range of motion. He exhibits no edema, tenderness or deformity.  Lymphadenopathy:    He has no cervical adenopathy.  Neurological: He is alert and oriented to person, place, and time.  Skin: Skin is warm and dry. No rash noted. No erythema.  Psychiatric: He has a normal mood and affect. His behavior is normal. Judgment and thought content normal.  Vitals reviewed.    Lab Results  Component Value Date   WBC 8.8 07/18/2017   HGB 12.5 (L) 07/18/2017   HCT 39.9 07/18/2017   MCV 82 07/18/2017   PLT 368 07/18/2017   Lab Results  Component Value Date   FERRITIN 16 (L) 03/06/2017   IRON 104 03/06/2017   TIBC 348 03/06/2017   UIBC 243 03/06/2017   IRONPCTSAT 30 03/06/2017   Lab Results  Component Value Date   RETICCTPCT 1.4 06/20/2015   RBC 4.85 07/18/2017    RETICCTABS 77.3 06/20/2015   No results found for: KPAFRELGTCHN, LAMBDASER, KAPLAMBRATIO No results found for: IGGSERUM, IGA, IGMSERUM No results found for: [name], SPEI   Chemistry      Component Value Date/Time   NA 142 07/18/2017 1421   NA 134 (L) 08/09/2016 1313   K 4.0 07/18/2017 1421   K 4.2 08/09/2016 1313   CL 103 07/18/2017 1421   CO2 29 07/18/2017 1421   CO2 20 (L) 08/09/2016 1313   BUN 19 07/18/2017 1421   BUN 19.9 08/09/2016 1313   CREATININE 1.4 (H) 07/18/2017 1421   CREATININE 1.3 08/09/2016 1313      Component Value Date/Time   CALCIUM 10.2 07/18/2017 1421   CALCIUM 10.0 08/09/2016 1313   ALKPHOS 65 07/18/2017 1421   ALKPHOS 75 08/09/2016 1313   AST 15 07/18/2017 1421   AST 14 08/09/2016 1313   ALT 21 07/18/2017 1421   ALT 23 08/09/2016 1313   BILITOT 0.50 07/18/2017 1421   BILITOT 0.34 08/09/2016 1313      Impression and Plan: Robert Tran is a 77 year old male. He has polycythemia. 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 However, phlebotomizing him definitely makes him feel better.  He clearly auto-phlebotomized himself.  I am unsure how much blood he lost.  However, this really was not all that bad from my perspective.  If he is profoundly iron deficient, then we will probably give him a dose of IV iron just to make him feel a little bit better.  I know this would seem a little bit unusual for somebody with polycythemia but again I believe that his quality of life will improve dramatically.  I would like to see him back in 6 weeks.  I want to make sure we stay in close contact with him.    I spent about 30 minutes with he and his wife.  I was not anticipating him having this issue with GI bleeding.   Volanda Napoleon, MD 1/3/20194:11 PM

## 2017-07-19 ENCOUNTER — Encounter: Payer: Self-pay | Admitting: Hematology & Oncology

## 2017-07-19 DIAGNOSIS — D509 Iron deficiency anemia, unspecified: Secondary | ICD-10-CM

## 2017-07-19 DIAGNOSIS — K5731 Diverticulosis of large intestine without perforation or abscess with bleeding: Secondary | ICD-10-CM

## 2017-07-19 DIAGNOSIS — K5791 Diverticulosis of intestine, part unspecified, without perforation or abscess with bleeding: Secondary | ICD-10-CM

## 2017-07-19 HISTORY — DX: Iron deficiency anemia, unspecified: D50.9

## 2017-07-19 HISTORY — DX: Diverticulosis of large intestine without perforation or abscess with bleeding: K57.31

## 2017-07-19 HISTORY — DX: Diverticulosis of intestine, part unspecified, without perforation or abscess with bleeding: K57.91

## 2017-07-19 LAB — FERRITIN: FERRITIN: 7 ng/mL — AB (ref 22–316)

## 2017-07-19 LAB — IRON AND TIBC
%SAT: 5 % — AB (ref 20–55)
IRON: 19 ug/dL — AB (ref 42–163)
TIBC: 413 ug/dL — ABNORMAL HIGH (ref 202–409)
UIBC: 394 ug/dL — AB (ref 117–376)

## 2017-07-19 LAB — LACTATE DEHYDROGENASE: LDH: 147 U/L (ref 125–245)

## 2017-07-19 NOTE — Addendum Note (Signed)
Addended by: Burney Gauze R on: 07/19/2017 01:37 PM   Modules accepted: Orders

## 2017-07-22 ENCOUNTER — Telehealth: Payer: Self-pay | Admitting: *Deleted

## 2017-07-22 NOTE — Telephone Encounter (Addendum)
Patient aware of results. Appointment scheduled.   ----- Message from Volanda Napoleon, MD sent at 07/19/2017  1:31 PM EST ----- Call his wife -- the iron is very low!!  We actually need to give him 1 dose of Injectafer.  This will make him feel better!!!  Laurey Arrow

## 2017-07-24 ENCOUNTER — Inpatient Hospital Stay: Payer: Medicare Other | Attending: Hematology & Oncology

## 2017-07-24 VITALS — BP 128/62 | HR 86 | Resp 20

## 2017-07-24 DIAGNOSIS — D45 Polycythemia vera: Secondary | ICD-10-CM

## 2017-07-24 DIAGNOSIS — D509 Iron deficiency anemia, unspecified: Secondary | ICD-10-CM | POA: Diagnosis not present

## 2017-07-24 DIAGNOSIS — D501 Sideropenic dysphagia: Secondary | ICD-10-CM

## 2017-07-24 DIAGNOSIS — K5731 Diverticulosis of large intestine without perforation or abscess with bleeding: Secondary | ICD-10-CM

## 2017-07-24 MED ORDER — SODIUM CHLORIDE 0.9 % IV SOLN
INTRAVENOUS | Status: DC
Start: 2017-07-24 — End: 2017-07-24
  Administered 2017-07-24: 12:00:00 via INTRAVENOUS

## 2017-07-24 MED ORDER — SODIUM CHLORIDE 0.9 % IV SOLN
750.0000 mg | Freq: Once | INTRAVENOUS | Status: AC
  Administered 2017-07-24: 750 mg via INTRAVENOUS
  Filled 2017-07-24: qty 15

## 2017-07-24 NOTE — Patient Instructions (Signed)
Ferric carboxymaltose injection What is this medicine? FERRIC CARBOXYMALTOSE (ferr-ik car-box-ee-mol-toes) is an iron complex. Iron is used to make healthy red blood cells, which carry oxygen and nutrients throughout the body. This medicine is used to treat anemia in people with chronic kidney disease or people who cannot take iron by mouth. This medicine may be used for other purposes; ask your health care provider or pharmacist if you have questions. COMMON BRAND NAME(S): Injectafer What should I tell my health care provider before I take this medicine? They need to know if you have any of these conditions: -anemia not caused by low iron levels -high levels of iron in the blood -liver disease -an unusual or allergic reaction to iron, other medicines, foods, dyes, or preservatives -pregnant or trying to get pregnant -breast-feeding How should I use this medicine? This medicine is for infusion into a vein. It is given by a health care professional in a hospital or clinic setting. Talk to your pediatrician regarding the use of this medicine in children. Special care may be needed. Overdosage: If you think you have taken too much of this medicine contact a poison control center or emergency room at once. NOTE: This medicine is only for you. Do not share this medicine with others. What if I miss a dose? It is important not to miss your dose. Call your doctor or health care professional if you are unable to keep an appointment. What may interact with this medicine? Do not take this medicine with any of the following medications: -deferoxamine -dimercaprol -other iron products This medicine may also interact with the following medications: -chloramphenicol -deferasirox This list may not describe all possible interactions. Give your health care provider a list of all the medicines, herbs, non-prescription drugs, or dietary supplements you use. Also tell them if you smoke, drink alcohol, or use  illegal drugs. Some items may interact with your medicine. What should I watch for while using this medicine? Visit your doctor or health care professional regularly. Tell your doctor if your symptoms do not start to get better or if they get worse. You may need blood work done while you are taking this medicine. You may need to follow a special diet. Talk to your doctor. Foods that contain iron include: whole grains/cereals, dried fruits, beans, or peas, leafy green vegetables, and organ meats (liver, kidney). What side effects may I notice from receiving this medicine? Side effects that you should report to your doctor or health care professional as soon as possible: -allergic reactions like skin rash, itching or hives, swelling of the face, lips, or tongue -breathing problems -changes in blood pressure -feeling faint or lightheaded, falls -flushing, sweating, or hot feelings Side effects that usually do not require medical attention (report to your doctor or health care professional if they continue or are bothersome): -changes in taste -constipation -dizziness -headache -nausea -pain, redness, or irritation at site where injected -vomiting This list may not describe all possible side effects. Call your doctor for medical advice about side effects. You may report side effects to FDA at 1-800-FDA-1088. Where should I keep my medicine? This drug is given in a hospital or clinic and will not be stored at home. NOTE: This sheet is a summary. It may not cover all possible information. If you have questions about this medicine, talk to your doctor, pharmacist, or health care provider.  2018 Elsevier/Gold Standard (2015-08-04 11:20:47)  

## 2017-08-30 ENCOUNTER — Inpatient Hospital Stay: Payer: Medicare Other | Attending: Hematology & Oncology | Admitting: Hematology & Oncology

## 2017-08-30 ENCOUNTER — Inpatient Hospital Stay: Payer: Medicare Other

## 2017-08-30 ENCOUNTER — Encounter: Payer: Self-pay | Admitting: Hematology & Oncology

## 2017-08-30 ENCOUNTER — Other Ambulatory Visit: Payer: Self-pay

## 2017-08-30 VITALS — BP 136/73 | HR 91 | Temp 98.1°F | Resp 18 | Wt 172.8 lb

## 2017-08-30 DIAGNOSIS — D45 Polycythemia vera: Secondary | ICD-10-CM

## 2017-08-30 LAB — CBC WITH DIFFERENTIAL (CANCER CENTER ONLY)
BASOS PCT: 1 %
Basophils Absolute: 0.1 10*3/uL (ref 0.0–0.1)
EOS ABS: 0.2 10*3/uL (ref 0.0–0.5)
Eosinophils Relative: 2 %
HEMATOCRIT: 46.4 % (ref 38.7–49.9)
HEMOGLOBIN: 15.2 g/dL (ref 13.0–17.1)
Lymphocytes Relative: 25 %
Lymphs Abs: 2.3 10*3/uL (ref 0.9–3.3)
MCH: 27.3 pg — ABNORMAL LOW (ref 28.0–33.4)
MCHC: 32.8 g/dL (ref 32.0–35.9)
MCV: 83.5 fL (ref 82.0–98.0)
MONOS PCT: 7 %
Monocytes Absolute: 0.7 10*3/uL (ref 0.1–0.9)
NEUTROS ABS: 6 10*3/uL (ref 1.5–6.5)
NEUTROS PCT: 65 %
Platelet Count: 273 10*3/uL (ref 145–400)
RBC: 5.56 MIL/uL (ref 4.20–5.70)
RDW: 22.7 % — ABNORMAL HIGH (ref 11.1–15.7)
WBC: 9.3 10*3/uL (ref 4.0–10.0)

## 2017-08-30 LAB — CMP (CANCER CENTER ONLY)
ALBUMIN: 3.7 g/dL (ref 3.5–5.0)
ALK PHOS: 74 U/L (ref 40–150)
ALT: 16 U/L (ref 0–55)
AST: 15 U/L (ref 5–34)
Anion gap: 10 (ref 3–11)
BUN: 21 mg/dL (ref 7–26)
CALCIUM: 10.1 mg/dL (ref 8.4–10.4)
CO2: 21 mmol/L — AB (ref 22–29)
CREATININE: 1.51 mg/dL — AB (ref 0.70–1.30)
Chloride: 105 mmol/L (ref 98–109)
GFR, Est AFR Am: 50 mL/min — ABNORMAL LOW (ref >60)
GFR, Estimated: 43 mL/min — ABNORMAL LOW (ref >60)
GLUCOSE: 283 mg/dL — AB (ref 70–140)
Potassium: 4.1 mmol/L (ref 3.5–5.1)
SODIUM: 136 mmol/L (ref 136–145)
Total Bilirubin: 0.3 mg/dL (ref 0.2–1.2)
Total Protein: 6.7 g/dL (ref 6.4–8.3)

## 2017-08-30 NOTE — Progress Notes (Signed)
Hematology and Oncology Follow Up Visit  Robert Tran [id number] [birth year] 77 y.o. 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  He is doing so much better now.  We did give him some IV iron.  This was given because his iron levels were so low.  This happened after he had a GI bleed.  Back in January, his ferritin was only 7 and his iron saturation was 5%.  He had a dose of Injectafer.  Again he feels better.  He really is happy that he has no longer any back pain.  Before the iron, he was having quite a bit of back issues.  He thought he was going to need to have his hips replaced.  Now, he is doing what he wants to do.  He has had no bleeding.  He has had no abdominal pain.  He has had no nausea or vomiting.  Overall, his performance status is ECOG 0.  Medications:  Allergies as of 08/30/2017      Reactions   Sulfa Drugs Cross Reactors    Dizziness, nausea   Minocycline Nausea Only   Pravastatin Sodium Other (See Comments)      Medication List        Accurate as of 08/30/17  1:29 PM. Always use your most recent med list.          amLODipine 10 MG tablet Commonly known as:  NORVASC Take 10 mg by mouth daily.   Folic Acid 485 MCG Tabs Take 400 mcg by mouth every morning.   meclizine 25 MG tablet Commonly known as:  ANTIVERT Take 1 tablet (25 mg total) by mouth 3 (three) times daily as needed.   metFORMIN 500 MG 24 hr tablet Commonly known as:  GLUCOPHAGE-XR Take 500 mg by mouth.   PLAVIX 75 MG tablet Generic drug:  clopidogrel Take 75 mg by mouth daily.   ramipril 10 MG tablet Commonly known as:  ALTACE Take 10 mg by mouth daily.   torsemide 20 MG tablet Commonly known as:  DEMADEX Take 20 mg by mouth as needed.   Vitamin D 2000 units tablet Take 5,000 Units by mouth daily. 03/08/15  taking 5000 u       Allergies:  Allergies  Allergen Reactions  . Sulfa Drugs Cross Reactors     Dizziness, nausea  . Minocycline Nausea Only  . Pravastatin Sodium Other (See Comments)    Past Medical History, Surgical history, Social history, and Family History were reviewed and updated.  Review of Systems: Review of Systems  Constitutional: Negative.   HENT: Negative.   Eyes: Negative.   Respiratory: Negative.   Cardiovascular: Negative.   Gastrointestinal: Negative.   Genitourinary: Negative.   Musculoskeletal: Negative.   Skin: Negative.   Neurological: Negative.   Endo/Heme/Allergies: Negative.   Psychiatric/Behavioral: Negative.     Physical Exam:  weight is 172 lb 12.8 oz (78.4 kg). His oral temperature is 98.1 F (36.7 C). His blood pressure is 136/73 and his pulse is 91. His respiration is 18 and oxygen saturation is 98%.   Wt Readings from Last 3 Encounters:  08/30/17 172 lb 12.8 oz (78.4 kg)  07/18/17 172 lb (78 kg)  03/06/17 171 lb (77.6 kg)    Physical Exam  Constitutional: He is oriented to person, place, and time.  HENT:  Head: Normocephalic and atraumatic.  Mouth/Throat: Oropharynx is clear and moist.  Eyes: EOM are normal. Pupils are equal, round, and reactive to light.  Neck: Normal range of motion.  Cardiovascular: Normal rate, regular rhythm and normal heart sounds.  Pulmonary/Chest: Effort normal and breath sounds normal.  Abdominal: Soft. Bowel sounds are normal.  Musculoskeletal: Normal range of motion. He exhibits no edema, tenderness or deformity.  Lymphadenopathy:    He has no cervical adenopathy.  Neurological: He is alert and oriented to person, place, and time.  Skin: Skin is warm and dry. No rash noted. No erythema.  Psychiatric: He has a normal mood and affect. His behavior is normal. Judgment and thought content normal.  Vitals reviewed.    Lab Results  Component Value Date   WBC 9.3 08/30/2017   HGB 12.5 (L) 07/18/2017     HCT 46.4 08/30/2017   MCV 83.5 08/30/2017   PLT 273 08/30/2017   Lab Results  Component Value Date   FERRITIN 7 (L) 07/18/2017   IRON 19 (L) 07/18/2017   TIBC 413 (H) 07/18/2017   UIBC 394 (H) 07/18/2017   IRONPCTSAT 5 (L) 07/18/2017   Lab Results  Component Value Date   RETICCTPCT 1.4 06/20/2015   RBC 5.56 08/30/2017   RETICCTABS 77.3 06/20/2015   No results found for: KPAFRELGTCHN, LAMBDASER, KAPLAMBRATIO No results found for: IGGSERUM, IGA, IGMSERUM No results found for: [name], SPEI   Chemistry      Component Value Date/Time   NA 142 07/18/2017 1421   NA 134 (L) 08/09/2016 1313   K 4.0 07/18/2017 1421   K 4.2 08/09/2016 1313   CL 103 07/18/2017 1421   CO2 29 07/18/2017 1421   CO2 20 (L) 08/09/2016 1313   BUN 19 07/18/2017 1421   BUN 19.9 08/09/2016 1313   CREATININE 1.4 (H) 07/18/2017 1421   CREATININE 1.3 08/09/2016 1313      Component Value Date/Time   CALCIUM 10.2 07/18/2017 1421   CALCIUM 10.0 08/09/2016 1313   ALKPHOS 65 07/18/2017 1421   ALKPHOS 75 08/09/2016 1313   AST 15 07/18/2017 1421   AST 14 08/09/2016 1313   ALT 21 07/18/2017 1421   ALT 23 08/09/2016 1313   BILITOT 0.50 07/18/2017 1421   BILITOT 0.34 08/09/2016 1313      Impression and Plan: Robert Tran is a 77 year old male. He has polycythemia. 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 However, phlebotomizing him definitely makes him feel better.  For right now, we will not phlebotomize him.  I think we can just watch him for a couple months.  Unfortunately, he is still smoking.  He still likes his coffee.  We will plan to get him back in 2 months.  At that time, we might need to phlebotomize him.   Volanda Napoleon, MD 2/15/20191:29 PM

## 2017-09-02 LAB — IRON AND TIBC
Iron: 85 ug/dL (ref 42–163)
Saturation Ratios: 28 % — ABNORMAL LOW (ref 42–163)
TIBC: 308 ug/dL (ref 202–409)
UIBC: 223 ug/dL

## 2017-09-02 LAB — FERRITIN: FERRITIN: 39 ng/mL (ref 22–316)

## 2017-11-01 ENCOUNTER — Ambulatory Visit: Payer: Medicare Other | Admitting: Hematology & Oncology

## 2017-11-01 ENCOUNTER — Other Ambulatory Visit: Payer: Medicare Other

## 2017-11-06 ENCOUNTER — Inpatient Hospital Stay (HOSPITAL_BASED_OUTPATIENT_CLINIC_OR_DEPARTMENT_OTHER): Payer: Medicare Other | Admitting: Hematology & Oncology

## 2017-11-06 ENCOUNTER — Inpatient Hospital Stay: Payer: Medicare Other

## 2017-11-06 ENCOUNTER — Inpatient Hospital Stay: Payer: Medicare Other | Attending: Hematology & Oncology

## 2017-11-06 VITALS — BP 107/69 | HR 97 | Temp 98.0°F | Resp 18 | Wt 173.8 lb

## 2017-11-06 VITALS — BP 120/52 | HR 83

## 2017-11-06 DIAGNOSIS — D45 Polycythemia vera: Secondary | ICD-10-CM

## 2017-11-06 DIAGNOSIS — F17210 Nicotine dependence, cigarettes, uncomplicated: Secondary | ICD-10-CM

## 2017-11-06 LAB — CBC WITH DIFFERENTIAL (CANCER CENTER ONLY)
BASOS ABS: 0.1 10*3/uL (ref 0.0–0.1)
BASOS PCT: 1 %
EOS ABS: 0.3 10*3/uL (ref 0.0–0.5)
EOS PCT: 3 %
HCT: 49.1 % (ref 38.7–49.9)
Hemoglobin: 16.8 g/dL (ref 13.0–17.1)
LYMPHS PCT: 26 %
Lymphs Abs: 2.7 10*3/uL (ref 0.9–3.3)
MCH: 30 pg (ref 28.0–33.4)
MCHC: 34.2 g/dL (ref 32.0–35.9)
MCV: 87.7 fL (ref 82.0–98.0)
MONO ABS: 0.8 10*3/uL (ref 0.1–0.9)
Monocytes Relative: 8 %
Neutro Abs: 6.6 10*3/uL — ABNORMAL HIGH (ref 1.5–6.5)
Neutrophils Relative %: 62 %
PLATELETS: 269 10*3/uL (ref 145–400)
RBC: 5.6 MIL/uL (ref 4.20–5.70)
RDW: 17.1 % — AB (ref 11.1–15.7)
WBC: 10.4 10*3/uL — AB (ref 4.0–10.0)

## 2017-11-06 LAB — CMP (CANCER CENTER ONLY)
ALBUMIN: 3.7 g/dL (ref 3.5–5.0)
ALK PHOS: 69 U/L (ref 40–150)
ALT: 26 U/L (ref 0–55)
AST: 13 U/L (ref 5–34)
Anion gap: 10 (ref 3–11)
BUN: 26 mg/dL (ref 7–26)
CALCIUM: 10.3 mg/dL (ref 8.4–10.4)
CO2: 21 mmol/L — AB (ref 22–29)
CREATININE: 1.53 mg/dL — AB (ref 0.70–1.30)
Chloride: 105 mmol/L (ref 98–109)
GFR, Est AFR Am: 49 mL/min — ABNORMAL LOW (ref >60)
GFR, Estimated: 42 mL/min — ABNORMAL LOW (ref >60)
GLUCOSE: 267 mg/dL — AB (ref 70–140)
Potassium: 4.7 mmol/L (ref 3.5–5.1)
Sodium: 136 mmol/L (ref 136–145)
Total Bilirubin: 0.3 mg/dL (ref 0.2–1.2)
Total Protein: 6.6 g/dL (ref 6.4–8.3)

## 2017-11-06 MED ORDER — SODIUM CHLORIDE 0.9 % IV SOLN
500.0000 mL | INTRAVENOUS | Status: AC
  Administered 2017-11-06: 500 mL via INTRAVENOUS

## 2017-11-06 NOTE — Progress Notes (Signed)
Hematology and Oncology Follow Up Visit  Robert Tran [id number] 1941/05/09 77 y.o. 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  He is doing okay.  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  He and his wife will hopefully be able to go down to the beach soon.  They have a beach house.  He is still smoking.  He is trying to cut back.  He still drinks quite a bit of coffee.  He has had no bleeding.  There was a point in which she was bleeding and we had to give him IV iron.  He says that after each phlebotomy, he has severe back and muscle pain.  I am not sure why this is.  However, he said after he got his iron infusion, the pain went away.    The last time we saw him in February, his ferritin was 39 with an iron saturation of 28%.   Overall, his performance status is ECOG 0.  Medications:  Allergies as of 11/06/2017      Reactions   Sulfa Drugs Cross Reactors    Dizziness, nausea   Minocycline Nausea Only   Pravastatin Sodium Other (See Comments)      Medication List        Accurate as of 11/06/17  1:59 PM. Always use your most recent med list.          amLODipine 10 MG tablet Commonly known as:  NORVASC Take 10 mg by mouth daily.   Folic Acid 956 MCG Tabs Take 400 mcg by mouth every morning.   meclizine 25 MG tablet Commonly known as:  ANTIVERT Take 1 tablet (25 mg total) by mouth 3 (three) times daily as needed.   metFORMIN 500 MG 24 hr tablet Commonly known as:  GLUCOPHAGE-XR Take 500 mg by mouth.   PLAVIX 75 MG tablet Generic drug:  clopidogrel Take 75 mg by mouth daily.   ramipril 10 MG tablet Commonly known as:  ALTACE Take 10 mg by mouth daily.   torsemide 20 MG tablet Commonly known as:  DEMADEX Take 20 mg by mouth as needed.   Vitamin D 2000  units tablet Take 5,000 Units by mouth daily. 03/08/15 taking 5000 u       Allergies:  Allergies  Allergen Reactions  . Sulfa Drugs Cross Reactors     Dizziness, nausea  . Minocycline Nausea Only  . Pravastatin Sodium Other (See Comments)    Past Medical History, Surgical history, Social history, and Family History were reviewed and updated.  Review of Systems: Review of Systems  Constitutional: Negative.   HENT: Negative.   Eyes: Negative.   Respiratory: Negative.   Cardiovascular: Negative.   Gastrointestinal: Negative.   Genitourinary: Negative.   Musculoskeletal: Negative.   Skin: Negative.   Neurological: Negative.   Endo/Heme/Allergies: Negative.   Psychiatric/Behavioral: Negative.     Physical Exam:  weight is 173 lb 12 oz (78.8 kg). His oral temperature is 98 F (36.7 C). His blood pressure is 107/69 and his pulse is 97. His respiration is 18 and oxygen saturation is 100%.   Wt Readings from Last 3 Encounters:  11/06/17 173 lb 12 oz (78.8 kg)  08/30/17 172 lb 12.8 oz (78.4 kg)  07/18/17 172 lb (78 kg)  Physical Exam  Constitutional: He is oriented to person, place, and time.  HENT:  Head: Normocephalic and atraumatic.  Mouth/Throat: Oropharynx is clear and moist.  Eyes: Pupils are equal, round, and reactive to light. EOM are normal.  Neck: Normal range of motion.  Cardiovascular: Normal rate, regular rhythm and normal heart sounds.  Pulmonary/Chest: Effort normal and breath sounds normal.  Abdominal: Soft. Bowel sounds are normal.  Musculoskeletal: Normal range of motion. He exhibits no edema, tenderness or deformity.  Lymphadenopathy:    He has no cervical adenopathy.  Neurological: He is alert and oriented to person, place, and time.  Skin: Skin is warm and dry. No rash noted. No erythema.  Psychiatric: He has a normal mood and affect. His behavior is normal. Judgment and thought content normal.  Vitals reviewed.    Lab Results  Component  Value Date   WBC 10.4 (H) 11/06/2017   HGB 16.8 11/06/2017   HCT 49.1 11/06/2017   MCV 87.7 11/06/2017   PLT 269 11/06/2017   Lab Results  Component Value Date   FERRITIN 39 08/30/2017   IRON 85 08/30/2017   TIBC 308 08/30/2017   UIBC 223 08/30/2017   IRONPCTSAT 28 (L) 08/30/2017   Lab Results  Component Value Date   RETICCTPCT 1.4 06/20/2015   RBC 5.60 11/06/2017   RETICCTABS 77.3 06/20/2015   No results found for: KPAFRELGTCHN, LAMBDASER, KAPLAMBRATIO No results found for: IGGSERUM, IGA, IGMSERUM No results found for: [name], SPEI   Chemistry      Component Value Date/Time   NA 136 08/30/2017 1151   NA 142 07/18/2017 1421   NA 134 (L) 08/09/2016 1313   K 4.1 08/30/2017 1151   K 4.0 07/18/2017 1421   K 4.2 08/09/2016 1313   CL 105 08/30/2017 1151   CL 103 07/18/2017 1421   CO2 21 (L) 08/30/2017 1151   CO2 29 07/18/2017 1421   CO2 20 (L) 08/09/2016 1313   BUN 21 08/30/2017 1151   BUN 19 07/18/2017 1421   BUN 19.9 08/09/2016 1313   CREATININE 1.51 (H) 08/30/2017 1151   CREATININE 1.4 (H) 07/18/2017 1421   CREATININE 1.3 08/09/2016 1313      Component Value Date/Time   CALCIUM 10.1 08/30/2017 1151   CALCIUM 10.2 07/18/2017 1421   CALCIUM 10.0 08/09/2016 1313   ALKPHOS 74 08/30/2017 1151   ALKPHOS 65 07/18/2017 1421   ALKPHOS 75 08/09/2016 1313   AST 15 08/30/2017 1151   AST 14 08/09/2016 1313   ALT 16 08/30/2017 1151   ALT 21 07/18/2017 1421   ALT 23 08/09/2016 1313   BILITOT 0.3 08/30/2017 1151   BILITOT 0.34 08/09/2016 1313      Impression and Plan: Robert Tran is a 77 year old male. He has polycythemia. 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 However, phlebotomizing him definitely makes him feel better.  We will go ahead and phlebotomize him today.  We was given IV fluids after the phlebotomy.  We will go ahead and plan to get him back to see [place] in another 4 months.     Volanda Napoleon,  MD 4/24/20191:59 PM

## 2017-11-06 NOTE — Progress Notes (Signed)
Robert Tran presents today for phlebotomy per MD orders. Phlebotomy procedure started at 1420 and ended at 1450 via 18 g angio cath to left anterior forearm. 530 grams removed. Pressure dressing applied.  Patient observed for 30 minutes after procedure without any incident and fluids given for 30 minutes with no difficulties.  Patient tolerated procedure well.  Snack and drink taken.  IV needle removed intact.  Phlebotomize 500 grams and give 500 ml of NS IV over 30 minutes after phlebotomy per Dr. Marin Olp.

## 2017-11-06 NOTE — Patient Instructions (Signed)

## 2017-11-07 LAB — IRON AND TIBC
Iron: 114 ug/dL (ref 42–163)
SATURATION RATIOS: 35 % — AB (ref 42–163)
TIBC: 323 ug/dL (ref 202–409)
UIBC: 209 ug/dL

## 2017-11-07 LAB — FERRITIN: Ferritin: 17 ng/mL — ABNORMAL LOW (ref 22–316)

## 2018-03-07 ENCOUNTER — Inpatient Hospital Stay: Payer: Medicare Other | Attending: Hematology & Oncology | Admitting: Hematology & Oncology

## 2018-03-07 ENCOUNTER — Other Ambulatory Visit: Payer: Self-pay

## 2018-03-07 ENCOUNTER — Encounter: Payer: Self-pay | Admitting: Hematology & Oncology

## 2018-03-07 ENCOUNTER — Inpatient Hospital Stay: Payer: Medicare Other

## 2018-03-07 VITALS — BP 131/71 | HR 88 | Temp 97.6°F | Resp 18 | Wt 168.0 lb

## 2018-03-07 VITALS — BP 120/86 | HR 86 | Resp 18

## 2018-03-07 DIAGNOSIS — D45 Polycythemia vera: Secondary | ICD-10-CM

## 2018-03-07 LAB — CMP (CANCER CENTER ONLY)
ALT: 17 U/L (ref 0–44)
ANION GAP: 10 (ref 5–15)
AST: 13 U/L — ABNORMAL LOW (ref 15–41)
Albumin: 4 g/dL (ref 3.5–5.0)
Alkaline Phosphatase: 75 U/L (ref 38–126)
BILIRUBIN TOTAL: 0.3 mg/dL (ref 0.3–1.2)
BUN: 18 mg/dL (ref 8–23)
CO2: 23 mmol/L (ref 22–32)
Calcium: 10.6 mg/dL — ABNORMAL HIGH (ref 8.9–10.3)
Chloride: 107 mmol/L (ref 98–111)
Creatinine: 1.33 mg/dL — ABNORMAL HIGH (ref 0.61–1.24)
GFR, EST NON AFRICAN AMERICAN: 50 mL/min — AB (ref >60)
GFR, Est AFR Am: 58 mL/min — ABNORMAL LOW (ref >60)
Glucose, Bld: 228 mg/dL — ABNORMAL HIGH (ref 70–99)
POTASSIUM: 4.2 mmol/L (ref 3.5–5.1)
Sodium: 140 mmol/L (ref 135–145)
TOTAL PROTEIN: 7.1 g/dL (ref 6.5–8.1)

## 2018-03-07 LAB — CBC WITH DIFFERENTIAL (CANCER CENTER ONLY)
BASOS ABS: 0.1 10*3/uL (ref 0.0–0.1)
Basophils Relative: 1 %
Eosinophils Absolute: 0.3 10*3/uL (ref 0.0–0.5)
Eosinophils Relative: 4 %
HEMATOCRIT: 52.5 % — AB (ref 38.7–49.9)
Hemoglobin: 17.4 g/dL — ABNORMAL HIGH (ref 13.0–17.1)
LYMPHS PCT: 27 %
Lymphs Abs: 2.5 10*3/uL (ref 0.9–3.3)
MCH: 29.8 pg (ref 28.0–33.4)
MCHC: 33.1 g/dL (ref 32.0–35.9)
MCV: 90.1 fL (ref 82.0–98.0)
Monocytes Absolute: 0.7 10*3/uL (ref 0.1–0.9)
Monocytes Relative: 7 %
NEUTROS ABS: 5.7 10*3/uL (ref 1.5–6.5)
Neutrophils Relative %: 61 %
PLATELETS: 284 10*3/uL (ref 145–400)
RBC: 5.83 MIL/uL — AB (ref 4.20–5.70)
RDW: 14.8 % (ref 11.1–15.7)
WBC: 9.3 10*3/uL (ref 4.0–10.0)

## 2018-03-07 MED ORDER — SODIUM CHLORIDE 0.9 % IV SOLN
Freq: Once | INTRAVENOUS | Status: AC
  Administered 2018-03-07: 14:00:00 via INTRAVENOUS
  Filled 2018-03-07: qty 250

## 2018-03-07 NOTE — Progress Notes (Signed)
Hematology and Oncology Follow Up Visit  Robert Tran [id number] 10-23-1940 77 y.o. 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  He is doing okay.  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  He had a busy summer.  He is wife have a beach house.  They do go down.  However, it was quite warm this summer for them.  He still smokes a pack a day of cigarettes.  He still drinks about 2 or 3 cups of coffee a day.  His iron studies that were done we saw him back in April showed a ferritin of 17 with iron saturation of 35%.  He has had no headaches.  It is finding that when he is very he has a phlebotomy, he gets bad lower back pain and cannot bend over for a couple days.  He has had no fever.  He has had no cough.   Overall, his performance status is ECOG 0.  Medications:  Allergies as of 03/07/2018      Reactions   Sulfa Antibiotics Nausea Only   Dizziness, nausea   Sulfa Drugs Cross Reactors    Dizziness, nausea   Minocycline Nausea Only   Pravastatin Sodium Other (See Comments)      Medication List        Accurate as of 03/07/18  1:43 PM. Always use your most recent med list.          amLODipine 10 MG tablet Commonly known as:  NORVASC Take 10 mg by mouth daily.   Folic Acid 841 MCG Tabs Take 400 mcg by mouth every morning.   meclizine 25 MG tablet Commonly known as:  ANTIVERT Take 1 tablet (25 mg total) by mouth 3 (three) times daily as needed.   metFORMIN 500 MG 24 hr tablet Commonly known as:  GLUCOPHAGE-XR Take 500 mg by mouth.   PLAVIX 75 MG tablet Generic drug:  clopidogrel Take 75 mg by mouth daily.   ramipril 10 MG tablet Commonly known as:  ALTACE Take 10 mg by mouth daily.   torsemide 20 MG tablet Commonly known as:  DEMADEX Take 20 mg by mouth as  needed.   Vitamin D 2000 units tablet Take 5,000 Units by mouth daily. 03/08/15 taking 5000 u       Allergies:  Allergies  Allergen Reactions  . Sulfa Antibiotics Nausea Only    Dizziness, nausea  . Sulfa Drugs Cross Reactors     Dizziness, nausea  . Minocycline Nausea Only  . Pravastatin Sodium Other (See Comments)    Past Medical History, Surgical history, Social history, and Family History were reviewed and updated.  Review of Systems: Review of Systems  Constitutional: Negative.   HENT: Negative.   Eyes: Negative.   Respiratory: Negative.   Cardiovascular: Negative.   Gastrointestinal: Negative.   Genitourinary: Negative.   Musculoskeletal: Negative.   Skin: Negative.   Neurological: Negative.   Endo/Heme/Allergies: Negative.   Psychiatric/Behavioral: Negative.     Physical Exam:  weight is 168 lb (76.2 kg). His oral temperature is 97.6 F (36.4 C). His blood pressure is 131/71 and his pulse is 88. His respiration is 18 and oxygen saturation is 97%.   Wt Readings from Last 3 Encounters:  03/07/18 168 lb (76.2 kg)  11/06/17 173  lb 12 oz (78.8 kg)  08/30/17 172 lb 12.8 oz (78.4 kg)    Physical Exam  Constitutional: He is oriented to person, place, and time.  HENT:  Head: Normocephalic and atraumatic.  Mouth/Throat: Oropharynx is clear and moist.  Eyes: Pupils are equal, round, and reactive to light. EOM are normal.  Neck: Normal range of motion.  Cardiovascular: Normal rate, regular rhythm and normal heart sounds.  Pulmonary/Chest: Effort normal and breath sounds normal.  Abdominal: Soft. Bowel sounds are normal.  Musculoskeletal: Normal range of motion. He exhibits no edema, tenderness or deformity.  Lymphadenopathy:    He has no cervical adenopathy.  Neurological: He is alert and oriented to person, place, and time.  Skin: Skin is warm and dry. No rash noted. No erythema.  Psychiatric: He has a normal mood and affect. His behavior is normal. Judgment  and thought content normal.  Vitals reviewed.    Lab Results  Component Value Date   WBC 9.3 03/07/2018   HGB 17.4 (H) 03/07/2018   HCT 52.5 (H) 03/07/2018   MCV 90.1 03/07/2018   PLT 284 03/07/2018   Lab Results  Component Value Date   FERRITIN 17 (L) 11/06/2017   IRON 114 11/06/2017   TIBC 323 11/06/2017   UIBC 209 11/06/2017   IRONPCTSAT 35 (L) 11/06/2017   Lab Results  Component Value Date   RETICCTPCT 1.4 06/20/2015   RBC 5.83 (H) 03/07/2018   RETICCTABS 77.3 06/20/2015   No results found for: KPAFRELGTCHN, LAMBDASER, KAPLAMBRATIO No results found for: Kandis Cocking, IGMSERUM No results found for: [name], SPEI   Chemistry      Component Value Date/Time   NA 136 11/06/2017 1302   NA 142 07/18/2017 1421   NA 134 (L) 08/09/2016 1313   K 4.7 11/06/2017 1302   K 4.0 07/18/2017 1421   K 4.2 08/09/2016 1313   CL 105 11/06/2017 1302   CL 103 07/18/2017 1421   CO2 21 (L) 11/06/2017 1302   CO2 29 07/18/2017 1421   CO2 20 (L) 08/09/2016 1313   BUN 26 11/06/2017 1302   BUN 19 07/18/2017 1421   BUN 19.9 08/09/2016 1313   CREATININE 1.53 (H) 11/06/2017 1302   CREATININE 1.4 (H) 07/18/2017 1421   CREATININE 1.3 08/09/2016 1313      Component Value Date/Time   CALCIUM 10.3 11/06/2017 1302   CALCIUM 10.2 07/18/2017 1421   CALCIUM 10.0 08/09/2016 1313   ALKPHOS 69 11/06/2017 1302   ALKPHOS 65 07/18/2017 1421   ALKPHOS 75 08/09/2016 1313   AST 13 11/06/2017 1302   AST 14 08/09/2016 1313   ALT 26 11/06/2017 1302   ALT 21 07/18/2017 1421   ALT 23 08/09/2016 1313   BILITOT 0.3 11/06/2017 1302   BILITOT 0.34 08/09/2016 1313      Impression and Plan: Robert Tran is a 77 year old male. He has polycythemia. 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 However, phlebotomizing him definitely makes him feel better.  We will go ahead and phlebotomize him today.  We was given IV fluids after the phlebotomy.  We will have  to get him back sooner than 4 months.  I will get him back in 2 months.  I really want to get as aggressive as possible with his polycythemia.      Volanda Napoleon, MD 8/23/20191:43 PM

## 2018-03-07 NOTE — Addendum Note (Signed)
Addended by: Rico Ala on: 03/07/2018 02:55 PM   Modules accepted: Orders

## 2018-03-07 NOTE — Patient Instructions (Signed)
Therapeutic Phlebotomy Therapeutic phlebotomy is the controlled removal of blood from a person's body for the purpose of treating a medical condition. The procedure is similar to donating blood. Usually, about a pint (470 mL, or 0.47L) of blood is removed. The average adult has 9-12 pints (4.3-5.7 L) of blood. Therapeutic phlebotomy may be used to treat the following medical conditions:  Hemochromatosis. This is a condition in which the blood contains too much iron.  Polycythemia vera. This is a condition in which the blood contains too many red blood cells.  Porphyria cutanea tarda. This is a disease in which an important part of hemoglobin is not made properly. It results in the buildup of abnormal amounts of porphyrins in the body.  Sickle cell disease. This is a condition in which the red blood cells form an abnormal crescent shape rather than a round shape.  Tell a health care provider about:  Any allergies you have.  All medicines you are taking, including vitamins, herbs, eye drops, creams, and over-the-counter medicines.  Any problems you or family members have had with anesthetic medicines.  Any blood disorders you have.  Any surgeries you have had.  Any medical conditions you have. What are the risks? Generally, this is a safe procedure. However, problems may occur, including:  Nausea or light-headedness.  Low blood pressure.  Soreness, bleeding, swelling, or bruising at the needle insertion site.  Infection.  What happens before the procedure?  Follow instructions from your health care provider about eating or drinking restrictions.  Ask your health care provider about changing or stopping your regular medicines. This is especially important if you are taking diabetes medicines or blood thinners.  Wear clothing with sleeves that can be raised above the elbow.  Plan to have someone take you home after the procedure.  You may have a blood sample taken. What  happens during the procedure?  A needle will be inserted into one of your veins.  Tubing and a collection bag will be attached to that needle.  Blood will flow through the needle and tubing into the collection bag.  You may be asked to open and close your hand slowly and continually during the entire collection.  After the specified amount of blood has been removed from your body, the collection bag and tubing will be clamped.  The needle will be removed from your vein.  Pressure will be held on the site of the needle insertion to stop the bleeding.  A bandage (dressing) will be placed over the needle insertion site. The procedure may vary among health care providers and hospitals. What happens after the procedure?  Your recovery will be assessed and monitored.  You can return to your normal activities as directed by your health care provider. This information is not intended to replace advice given to you by your health care provider. Make sure you discuss any questions you have with your health care provider. Document Released: 12/04/2010 Document Revised: 03/03/2016 Document Reviewed: 06/28/2014 Elsevier Interactive Patient Education  2018 Elsevier Inc.  

## 2018-03-07 NOTE — Progress Notes (Signed)
Robert Tran presents today for phlebotomy per MD orders. Phlebotomy procedure started at 1400 with 20 g angiocath by Randolm Idol RN and ended at 1415. 510 grams removed. 500 cc .9 NS started in same site over 1/2 hours Patient observed for 30 minutes after procedure without any incident. Patient tolerated procedure well. IV needle removed intact.

## 2018-03-10 LAB — IRON AND TIBC
IRON: 68 ug/dL (ref 42–163)
SATURATION RATIOS: 18 % — AB (ref 42–163)
TIBC: 383 ug/dL (ref 202–409)
UIBC: 314 ug/dL

## 2018-03-10 LAB — FERRITIN: Ferritin: 11 ng/mL — ABNORMAL LOW (ref 24–336)

## 2018-03-27 DIAGNOSIS — E1122 Type 2 diabetes mellitus with diabetic chronic kidney disease: Secondary | ICD-10-CM | POA: Insufficient documentation

## 2018-03-27 DIAGNOSIS — R972 Elevated prostate specific antigen [PSA]: Secondary | ICD-10-CM | POA: Insufficient documentation

## 2018-03-27 DIAGNOSIS — N183 Chronic kidney disease, stage 3 unspecified: Secondary | ICD-10-CM | POA: Insufficient documentation

## 2018-05-09 ENCOUNTER — Inpatient Hospital Stay: Payer: Medicare Other | Attending: Hematology & Oncology | Admitting: Family

## 2018-05-09 ENCOUNTER — Inpatient Hospital Stay: Payer: Medicare Other

## 2018-05-09 ENCOUNTER — Other Ambulatory Visit: Payer: Self-pay

## 2018-05-09 VITALS — BP 147/72 | HR 98 | Temp 97.8°F | Resp 18 | Wt 172.0 lb

## 2018-05-09 VITALS — BP 120/80 | HR 90

## 2018-05-09 DIAGNOSIS — D45 Polycythemia vera: Secondary | ICD-10-CM

## 2018-05-09 DIAGNOSIS — D500 Iron deficiency anemia secondary to blood loss (chronic): Secondary | ICD-10-CM

## 2018-05-09 LAB — CMP (CANCER CENTER ONLY)
ALT: 18 U/L (ref 0–44)
AST: 15 U/L (ref 15–41)
Albumin: 3.8 g/dL (ref 3.5–5.0)
Alkaline Phosphatase: 74 U/L (ref 38–126)
Anion gap: 12 (ref 5–15)
BILIRUBIN TOTAL: 0.3 mg/dL (ref 0.3–1.2)
BUN: 23 mg/dL (ref 8–23)
CO2: 24 mmol/L (ref 22–32)
CREATININE: 1.66 mg/dL — AB (ref 0.61–1.24)
Calcium: 10.7 mg/dL — ABNORMAL HIGH (ref 8.9–10.3)
Chloride: 105 mmol/L (ref 98–111)
GFR, EST AFRICAN AMERICAN: 44 mL/min — AB (ref >60)
GFR, Estimated: 38 mL/min — ABNORMAL LOW (ref >60)
Glucose, Bld: 227 mg/dL — ABNORMAL HIGH (ref 70–99)
POTASSIUM: 4.1 mmol/L (ref 3.5–5.1)
Sodium: 141 mmol/L (ref 135–145)
TOTAL PROTEIN: 7 g/dL (ref 6.5–8.1)

## 2018-05-09 LAB — CBC WITH DIFFERENTIAL (CANCER CENTER ONLY)
Abs Immature Granulocytes: 0.09 10*3/uL — ABNORMAL HIGH (ref 0.00–0.07)
BASOS PCT: 1 %
Basophils Absolute: 0.1 10*3/uL (ref 0.0–0.1)
EOS ABS: 0.2 10*3/uL (ref 0.0–0.5)
Eosinophils Relative: 2 %
HEMATOCRIT: 51.2 % (ref 39.0–52.0)
Hemoglobin: 16.5 g/dL (ref 13.0–17.0)
IMMATURE GRANULOCYTES: 1 %
LYMPHS ABS: 2.4 10*3/uL (ref 0.7–4.0)
Lymphocytes Relative: 23 %
MCH: 29.5 pg (ref 26.0–34.0)
MCHC: 32.2 g/dL (ref 30.0–36.0)
MCV: 91.6 fL (ref 80.0–100.0)
MONO ABS: 0.7 10*3/uL (ref 0.1–1.0)
MONOS PCT: 6 %
NEUTROS PCT: 67 %
Neutro Abs: 7.2 10*3/uL (ref 1.7–7.7)
PLATELETS: 283 10*3/uL (ref 150–400)
RBC: 5.59 MIL/uL (ref 4.22–5.81)
RDW: 14.1 % (ref 11.5–15.5)
WBC Count: 10.7 10*3/uL — ABNORMAL HIGH (ref 4.0–10.5)
nRBC: 0 % (ref 0.0–0.2)

## 2018-05-09 MED ORDER — SODIUM CHLORIDE 0.9 % IV SOLN
INTRAVENOUS | Status: AC
  Filled 2018-05-09: qty 250

## 2018-05-09 NOTE — Progress Notes (Signed)
Robert Tran presents today for phlebotomy per MD orders. Phlebotomy procedure started at 1230 and ended at 1245. 512 grams removed. Patient observed for 30 minutes after procedure without any incident. Patient tolerated procedure well. IV needle removed intact.

## 2018-05-09 NOTE — Patient Instructions (Signed)
Therapeutic Phlebotomy Therapeutic phlebotomy is the controlled removal of blood from a person's body for the purpose of treating a medical condition. The procedure is similar to donating blood. Usually, about a pint (470 mL, or 0.47L) of blood is removed. The average adult has 9-12 pints (4.3-5.7 L) of blood. Therapeutic phlebotomy may be used to treat the following medical conditions:  Hemochromatosis. This is a condition in which the blood contains too much iron.  Polycythemia vera. This is a condition in which the blood contains too many red blood cells.  Porphyria cutanea tarda. This is a disease in which an important part of hemoglobin is not made properly. It results in the buildup of abnormal amounts of porphyrins in the body.  Sickle cell disease. This is a condition in which the red blood cells form an abnormal crescent shape rather than a round shape.  Tell a health care provider about:  Any allergies you have.  All medicines you are taking, including vitamins, herbs, eye drops, creams, and over-the-counter medicines.  Any problems you or family members have had with anesthetic medicines.  Any blood disorders you have.  Any surgeries you have had.  Any medical conditions you have. What are the risks? Generally, this is a safe procedure. However, problems may occur, including:  Nausea or light-headedness.  Low blood pressure.  Soreness, bleeding, swelling, or bruising at the needle insertion site.  Infection.  What happens before the procedure?  Follow instructions from your health care provider about eating or drinking restrictions.  Ask your health care provider about changing or stopping your regular medicines. This is especially important if you are taking diabetes medicines or blood thinners.  Wear clothing with sleeves that can be raised above the elbow.  Plan to have someone take you home after the procedure.  You may have a blood sample taken. What  happens during the procedure?  A needle will be inserted into one of your veins.  Tubing and a collection bag will be attached to that needle.  Blood will flow through the needle and tubing into the collection bag.  You may be asked to open and close your hand slowly and continually during the entire collection.  After the specified amount of blood has been removed from your body, the collection bag and tubing will be clamped.  The needle will be removed from your vein.  Pressure will be held on the site of the needle insertion to stop the bleeding.  A bandage (dressing) will be placed over the needle insertion site. The procedure may vary among health care providers and hospitals. What happens after the procedure?  Your recovery will be assessed and monitored.  You can return to your normal activities as directed by your health care provider. This information is not intended to replace advice given to you by your health care provider. Make sure you discuss any questions you have with your health care provider. Document Released: 12/04/2010 Document Revised: 03/03/2016 Document Reviewed: 06/28/2014 Elsevier Interactive Patient Education  2018 Elsevier Inc.  

## 2018-05-09 NOTE — Progress Notes (Signed)
Hematology and Oncology Follow Up Visit  Tilman Mcclaren [id number] 1940/08/11 77 y.o. 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 Moxey is here today with his wife for follow-up. He is symptomatic with body aches at times. His Hct is 51.2%. His complexion is quite [place]. He states that he is still smoking "too much."  He has had no swelling, numbness or tingling in his extremities.  No fever, chills, n/v, cough, rash, dizziness, headache, blurred vision, SOB, chest pain, palpitations, abdominal pain or changes in bowel or bladder habits.  No lymphadenopathy noted on exam.  No bleeding, bruising or petechiae on Plavix.   He has maintained a good appetite and is staying well hydrated. His weight is stable.   ECOG Performance Status: 1 - Symptomatic but completely ambulatory  Medications:  Allergies as of 05/09/2018      Reactions   Sulfa Antibiotics Nausea Only   Dizziness, nausea   Sulfa Drugs Cross Reactors    Dizziness, nausea   Minocycline Nausea Only   Pravastatin Sodium Other (See Comments)      Medication List        Accurate as of 05/09/18 12:42 PM. Always use your most recent med list.          albuterol 108 (90 Base) MCG/ACT inhaler Commonly known as:  PROVENTIL HFA;VENTOLIN HFA Inhale into the lungs.   amLODipine 10 MG tablet Commonly known as:  NORVASC Take 10 mg by mouth daily.   Folic Acid 401 MCG Tabs Take 400 mcg by mouth every morning.   JANUVIA 100 MG tablet Generic drug:  sitaGLIPtin Take 100 mg by mouth daily.   meclizine 25 MG tablet Commonly known as:  ANTIVERT Take 1 tablet (25 mg total) by mouth 3 (three) times daily as needed.   metFORMIN 500 MG 24 hr tablet Commonly known as:  GLUCOPHAGE-XR Take 500 mg by mouth.   PLAVIX 75 MG tablet Generic drug:  clopidogrel Take 75 mg by mouth  daily.   ramipril 10 MG tablet Commonly known as:  ALTACE Take 10 mg by mouth daily.   torsemide 10 MG tablet Commonly known as:  DEMADEX   torsemide 20 MG tablet Commonly known as:  DEMADEX Take 20 mg by mouth as needed.   Vitamin D 2000 units tablet Take 5,000 Units by mouth daily. 03/08/15 taking 5000 u       Allergies:  Allergies  Allergen Reactions  . Sulfa Antibiotics Nausea Only    Dizziness, nausea  . Sulfa Drugs Cross Reactors     Dizziness, nausea  . Minocycline Nausea Only  . Pravastatin Sodium Other (See Comments)    Past Medical History, Surgical history, Social history, and Family History were reviewed and updated.  Review of Systems: All other 10 point review of systems is negative.   Physical Exam:  weight is 172 lb (78 kg). His oral temperature is 97.8 F (36.6 C). His blood pressure is 147/72 (abnormal) and his pulse is 98. His respiration is 18 and oxygen saturation is 100%.   Wt Readings from Last 3 Encounters:  05/09/18 172 lb (78 kg)  03/07/18 168 lb (76.2 kg)  11/06/17 173 lb 12 oz (78.8 kg)    Ocular: Sclerae unicteric, pupils equal, round and reactive to light Ear-nose-throat: Oropharynx clear, dentition fair Lymphatic: No cervical, supraclavicular or axillary adenopathy Lungs  no rales or rhonchi, good excursion bilaterally Heart regular rate and rhythm, no murmur appreciated Abd soft, nontender, positive bowel sounds, no liver or spleen tip palpated on exam, no fluid wave  MSK no focal spinal tenderness, no joint edema Neuro: non-focal, well-oriented, appropriate affect Breasts: Deferred   Lab Results  Component Value Date   WBC 10.7 (H) 05/09/2018   HGB 16.5 05/09/2018   HCT 51.2 05/09/2018   MCV 91.6 05/09/2018   PLT 283 05/09/2018   Lab Results  Component Value Date   FERRITIN 11 (L) 03/07/2018   IRON 68 03/07/2018   TIBC 383 03/07/2018   UIBC 314 03/07/2018   IRONPCTSAT 18 (L) 03/07/2018   Lab Results  Component  Value Date   RETICCTPCT 1.4 06/20/2015   RBC 5.59 05/09/2018   RETICCTABS 77.3 06/20/2015   No results found for: KPAFRELGTCHN, LAMBDASER, KAPLAMBRATIO No results found for: IGGSERUM, IGA, IGMSERUM No results found for: [name], SPEI   Chemistry      Component Value Date/Time   NA 140 03/07/2018 1219   NA 142 07/18/2017 1421   NA 134 (L) 08/09/2016 1313   K 4.2 03/07/2018 1219   K 4.0 07/18/2017 1421   K 4.2 08/09/2016 1313   CL 107 03/07/2018 1219   CL 103 07/18/2017 1421   CO2 23 03/07/2018 1219   CO2 29 07/18/2017 1421   CO2 20 (L) 08/09/2016 1313   BUN 18 03/07/2018 1219   BUN 19 07/18/2017 1421   BUN 19.9 08/09/2016 1313   CREATININE 1.33 (H) 03/07/2018 1219   CREATININE 1.4 (H) 07/18/2017 1421   CREATININE 1.3 08/09/2016 1313      Component Value Date/Time   CALCIUM 10.6 (H) 03/07/2018 1219   CALCIUM 10.2 07/18/2017 1421   CALCIUM 10.0 08/09/2016 1313   ALKPHOS 75 03/07/2018 1219   ALKPHOS 65 07/18/2017 1421   ALKPHOS 75 08/09/2016 1313   AST 13 (L) 03/07/2018 1219   AST 14 08/09/2016 1313   ALT 17 03/07/2018 1219   ALT 21 07/18/2017 1421   ALT 23 08/09/2016 1313   BILITOT 0.3 03/07/2018 1219   BILITOT 0.34 08/09/2016 1313       Impression and Plan: Ms. Puglia is a very pleasant 77 yo caucasian gentleman with secondary polycythemia (smoking). We will proceed with phlebotomy today as planned for Hct 51.2%. He does not want a second phlebotomy as he feels worse after. He only likes to have 1 phlebotomy every 8 weeks.  We will plan to see him in another 8 weeks.  They will contact our office with any questions or concerns. We can certainly see him sooner if need be.    Laverna Peace, NP 10/25/201912:42 PM

## 2018-05-12 LAB — IRON AND TIBC
IRON: 69 ug/dL (ref 42–163)
Saturation Ratios: 19 % — ABNORMAL LOW (ref 42–163)
TIBC: 371 ug/dL (ref 202–409)
UIBC: 302 ug/dL

## 2018-05-12 LAB — FERRITIN: Ferritin: 9 ng/mL — ABNORMAL LOW (ref 24–336)

## 2018-07-18 ENCOUNTER — Encounter: Payer: Self-pay | Admitting: Family

## 2018-07-18 ENCOUNTER — Inpatient Hospital Stay: Payer: Medicare Other

## 2018-07-18 ENCOUNTER — Inpatient Hospital Stay: Payer: Medicare Other | Attending: Hematology & Oncology | Admitting: Family

## 2018-07-18 VITALS — BP 119/83

## 2018-07-18 VITALS — BP 139/61 | HR 85 | Temp 97.0°F | Resp 17 | Ht 65.0 in | Wt 169.8 lb

## 2018-07-18 DIAGNOSIS — D45 Polycythemia vera: Secondary | ICD-10-CM | POA: Diagnosis present

## 2018-07-18 DIAGNOSIS — D500 Iron deficiency anemia secondary to blood loss (chronic): Secondary | ICD-10-CM

## 2018-07-18 DIAGNOSIS — D751 Secondary polycythemia: Secondary | ICD-10-CM

## 2018-07-18 LAB — CBC WITH DIFFERENTIAL (CANCER CENTER ONLY)
Abs Immature Granulocytes: 0.06 10*3/uL (ref 0.00–0.07)
BASOS ABS: 0.1 10*3/uL (ref 0.0–0.1)
Basophils Relative: 1 %
EOS ABS: 0.2 10*3/uL (ref 0.0–0.5)
EOS PCT: 3 %
HCT: 50.7 % (ref 39.0–52.0)
HEMOGLOBIN: 16.1 g/dL (ref 13.0–17.0)
Immature Granulocytes: 1 %
LYMPHS PCT: 27 %
Lymphs Abs: 2.3 10*3/uL (ref 0.7–4.0)
MCH: 29.4 pg (ref 26.0–34.0)
MCHC: 31.8 g/dL (ref 30.0–36.0)
MCV: 92.7 fL (ref 80.0–100.0)
Monocytes Absolute: 0.7 10*3/uL (ref 0.1–1.0)
Monocytes Relative: 9 %
NRBC: 0 % (ref 0.0–0.2)
Neutro Abs: 5.1 10*3/uL (ref 1.7–7.7)
Neutrophils Relative %: 59 %
Platelet Count: 295 10*3/uL (ref 150–400)
RBC: 5.47 MIL/uL (ref 4.22–5.81)
RDW: 13.2 % (ref 11.5–15.5)
WBC: 8.5 10*3/uL (ref 4.0–10.5)

## 2018-07-18 LAB — CMP (CANCER CENTER ONLY)
ALT: 14 U/L (ref 0–44)
ANION GAP: 6 (ref 5–15)
AST: 12 U/L — ABNORMAL LOW (ref 15–41)
Albumin: 4.1 g/dL (ref 3.5–5.0)
Alkaline Phosphatase: 65 U/L (ref 38–126)
BUN: 22 mg/dL (ref 8–23)
CHLORIDE: 103 mmol/L (ref 98–111)
CO2: 29 mmol/L (ref 22–32)
CREATININE: 1.41 mg/dL — AB (ref 0.61–1.24)
Calcium: 9.9 mg/dL (ref 8.9–10.3)
GFR, EST AFRICAN AMERICAN: 55 mL/min — AB (ref >60)
GFR, EST NON AFRICAN AMERICAN: 48 mL/min — AB (ref >60)
Glucose, Bld: 281 mg/dL — ABNORMAL HIGH (ref 70–99)
Potassium: 4.9 mmol/L (ref 3.5–5.1)
Sodium: 138 mmol/L (ref 135–145)
Total Bilirubin: 0.5 mg/dL (ref 0.3–1.2)
Total Protein: 6.5 g/dL (ref 6.5–8.1)

## 2018-07-18 MED ORDER — SODIUM CHLORIDE 0.9 % IV SOLN
INTRAVENOUS | Status: AC
  Filled 2018-07-18: qty 250

## 2018-07-18 NOTE — Progress Notes (Signed)
Hematology and Oncology Follow Up Visit  Robert Tran [id number] April 08, 1941 78 y.o. 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 He is staying busy with Robert family and working around the house.  Hct today is 50.7%. He is still a heavy smoker.  He is doing well on Plavix. He does bruise easily with this but has not noted any bleeding.  No fever, chills, n/v, cough, rash, dizziness, SOB, chest pain, palpitations, abdominal pain or changes in bowel or bladder habits.  No swelling, tenderness, numbness or tingling in Robert extremities.  No lymphadenopathy noted on exam.  He is still eating well and staying hydrated. Robert weight is stable.   ECOG Performance Status: 0 - Asymptomatic  Medications:  Allergies as of 07/18/2018      Reactions   Pravastatin Sodium Other (See Comments)   Patient stated,"it messed up my back and lege, I couldn't walk."   Minocycline Nausea Only   Sulfa Antibiotics Nausea Only   Sulfa Drugs Cross Reactors Nausea Only      Medication List       Accurate as of July 18, 2018 12:10 PM. Always use your most recent med list.        albuterol 108 (90 Base) MCG/ACT inhaler Commonly known as:  PROVENTIL HFA;VENTOLIN HFA Inhale into the lungs.   amLODipine 10 MG tablet Commonly known as:  NORVASC Take 10 mg by mouth daily.   Folic Acid 568 MCG Tabs Take 400 mcg by mouth every morning.   meclizine 25 MG tablet Commonly known as:  ANTIVERT Take 1 tablet (25 mg total) by mouth 3 (three) times daily as needed.   PLAVIX 75 MG tablet Generic drug:  clopidogrel Take 75 mg by mouth daily.   ramipril 10 MG tablet Commonly known as:  ALTACE Take 10 mg by mouth daily.   torsemide 10 MG tablet Commonly known as:  DEMADEX Take by mouth daily as needed.   Vitamin D 50  MCG (2000 UT) tablet Take 5,000 Units by mouth daily. 03/08/15 taking 5000 u       Allergies:  Allergies  Allergen Reactions  . Pravastatin Sodium Other (See Comments)    Patient stated,"it messed up my back and lege, I couldn't walk."  . Minocycline Nausea Only  . Sulfa Antibiotics Nausea Only  . Sulfa Drugs Cross Reactors Nausea Only    Past Medical History, Surgical history, Social history, and Family History were reviewed and updated.  Review of Systems: All other 10 point review of systems is negative.   Physical Exam:  height is 5\' 5"  (1.651 m) and weight is 169 lb 12.8 oz (77 kg). Her oral temperature is 97 F (36.1 C) (abnormal). Her blood pressure is 139/61 and her pulse is 85. Her respiration is 17 and oxygen saturation is 98%.   Wt Readings from Last 3 Encounters:  07/18/18 169 lb 12.8 oz (77 kg)  05/09/18 172 lb (78 kg)  03/07/18 168 lb (76.2 kg)    Ocular: Sclerae unicteric, pupils equal, round and reactive to light Ear-nose-throat: Oropharynx clear, dentition fair Lymphatic: No cervical, supraclavicular or axillary adenopathy Lungs no rales or rhonchi, good excursion bilaterally Heart regular rate and rhythm, no murmur appreciated Abd soft, nontender, positive bowel sounds, no liver or spleen tip palpated on exam, no  fluid wave  MSK no focal spinal tenderness, no joint edema Neuro: non-focal, well-oriented, appropriate affect Breasts: Deferred   Lab Results  Component Value Date   WBC 8.5 07/18/2018   HGB 16.1 07/18/2018   HCT 50.7 07/18/2018   MCV 92.7 07/18/2018   PLT 295 07/18/2018   Lab Results  Component Value Date   FERRITIN 9 (L) 05/09/2018   IRON 69 05/09/2018   TIBC 371 05/09/2018   UIBC 302 05/09/2018   IRONPCTSAT 19 (L) 05/09/2018   Lab Results  Component Value Date   RETICCTPCT 1.4 06/20/2015   RBC 5.47 07/18/2018   RETICCTABS 77.3 06/20/2015   No results found for: KPAFRELGTCHN, LAMBDASER, KAPLAMBRATIO No results found for:  Kandis Cocking, IGMSERUM No results found for: [name], SPEI   Chemistry      Component Value Date/Time   NA 141 05/09/2018 1134   NA 142 07/18/2017 1421   NA 134 (L) 08/09/2016 1313   K 4.1 05/09/2018 1134   K 4.0 07/18/2017 1421   K 4.2 08/09/2016 1313   CL 105 05/09/2018 1134   CL 103 07/18/2017 1421   CO2 24 05/09/2018 1134   CO2 29 07/18/2017 1421   CO2 20 (L) 08/09/2016 1313   BUN 23 05/09/2018 1134   BUN 19 07/18/2017 1421   BUN 19.9 08/09/2016 1313   CREATININE 1.66 (H) 05/09/2018 1134   CREATININE 1.4 (H) 07/18/2017 1421   CREATININE 1.3 08/09/2016 1313      Component Value Date/Time   CALCIUM 10.7 (H) 05/09/2018 1134   CALCIUM 10.2 07/18/2017 1421   CALCIUM 10.0 08/09/2016 1313   ALKPHOS 74 05/09/2018 1134   ALKPHOS 65 07/18/2017 1421   ALKPHOS 75 08/09/2016 1313   AST 15 05/09/2018 1134   AST 14 08/09/2016 1313   ALT 18 05/09/2018 1134   ALT 21 07/18/2017 1421   ALT 23 08/09/2016 1313   BILITOT 0.3 05/09/2018 1134   BILITOT 0.34 08/09/2016 1313       Impression and Plan: Robert Tran is a very pleasant 78 yo caucasian gentleman with secondary polycythemia (smoking). Complexion is still [place] but he is otherwise asymptomatic.  Hct today is 50.7% so we will proceed with phlebotomy today as planned.  We will plan to check labs and do a phlebotomy if needed every 8 weeks with follow-up in 4 months.  He is in agreement with the plan and will contact our office with any questions or concerns and will go to the ED in the event of an emergency.   Robert Peace, NP 1/3/202012:10 PM

## 2018-07-18 NOTE — Patient Instructions (Signed)
Therapeutic Phlebotomy Therapeutic phlebotomy is the planned removal of blood from a person's body for the purpose of treating a medical condition. The procedure is similar to donating blood. Usually, about a pint (470 mL, or 0.47 L) of blood is removed. The average adult has 9-12 pints (4.3-5.7 L) of blood in the body. Therapeutic phlebotomy may be used to treat the following medical conditions:  Hemochromatosis. This is a condition in which the blood contains too much iron.  Polycythemia vera. This is a condition in which the blood contains too many red blood cells.  Porphyria cutanea tarda. This is a disease in which an important part of hemoglobin is not made properly. It results in the buildup of abnormal amounts of porphyrins in the body.  Sickle cell disease. This is a condition in which the red blood cells form an abnormal crescent shape rather than a round shape. Tell a health care provider about:  Any allergies you have.  All medicines you are taking, including vitamins, herbs, eye drops, creams, and over-the-counter medicines.  Any problems you or family members have had with anesthetic medicines.  Any blood disorders you have.  Any surgeries you have had.  Any medical conditions you have.  Whether you are pregnant or may be pregnant. What are the risks? Generally, this is a safe procedure. However, problems may occur, including:  Nausea or light-headedness.  Low blood pressure (hypotension).  Soreness, bleeding, swelling, or bruising at the needle insertion site.  Infection. What happens before the procedure?  Follow instructions from your health care provider about eating or drinking restrictions.  Ask your health care provider about: ? Changing or stopping your regular medicines. This is especially important if you are taking diabetes medicines or blood thinners (anticoagulants). ? Taking medicines such as aspirin and ibuprofen. These medicines can thin your  blood. Do not take these medicines unless your health care provider tells you to take them. ? Taking over-the-counter medicines, vitamins, herbs, and supplements.  Wear clothing with sleeves that can be raised above the elbow.  Plan to have someone take you home from the hospital or clinic.  You may have a blood sample taken.  Your blood pressure, pulse rate, and breathing rate will be measured. What happens during the procedure?   To lower your risk of infection: ? Your health care team will wash or sanitize their hands. ? Your skin will be cleaned with an antiseptic.  You may be given a medicine to numb the area (local anesthetic).  A tourniquet will be placed on your arm.  A needle will be inserted into one of your veins.  Tubing and a collection bag will be attached to that needle.  Blood will flow through the needle and tubing into the collection bag.  The collection bag will be placed lower than your arm to allow gravity to help the flow of blood into the bag.  You may be asked to open and close your hand slowly and continually during the entire collection.  After the specified amount of blood has been removed from your body, the collection bag and tubing will be clamped.  The needle will be removed from your vein.  Pressure will be held on the site of the needle insertion to stop the bleeding.  A bandage (dressing) will be placed over the needle insertion site. The procedure may vary among health care providers and hospitals. What happens after the procedure?  Your blood pressure, pulse rate, and breathing rate will be   measured after the procedure.  You will be encouraged to drink fluids.  Your recovery will be assessed and monitored.  You can return to your normal activities as told by your health care provider. Summary  Therapeutic phlebotomy is the planned removal of blood from a person's body for the purpose of treating a medical condition.  Therapeutic  phlebotomy may be used to treat hemochromatosis, polycythemia vera, porphyria cutanea tarda, or sickle cell disease.  In the procedure, a needle is inserted and about a pint (470 mL, or 0.47 L) of blood is removed. The average adult has 9-12 pints (4.3-5.7 L) of blood in the body.  This is generally a safe procedure, but it can sometimes cause problems such as nausea, light-headedness, or low blood pressure (hypotension). This information is not intended to replace advice given to you by your health care provider. Make sure you discuss any questions you have with your health care provider. Document Released: 12/04/2010 Document Revised: 07/18/2017 Document Reviewed: 07/18/2017 Elsevier Interactive Patient Education  2019 [employer] Inc[employer]  

## 2018-07-18 NOTE — Progress Notes (Signed)
I unit phlebotomy performed over 10 minutes using a 16 gauge phlebotomy set to the Right AC. Patient tolerated well. Nourishment provided. Patient doesn't want to stay for the 30 minute post phlebotomy observation. Patient discharged ambulatory without complaints or concerns.

## 2018-07-21 LAB — IRON AND TIBC
IRON: 139 ug/dL (ref 42–163)
Saturation Ratios: 40 % (ref 20–55)
TIBC: 350 ug/dL (ref 202–409)
UIBC: 211 ug/dL (ref 117–376)

## 2018-07-21 LAB — FERRITIN: FERRITIN: 12 ng/mL — AB (ref 24–336)

## 2018-09-12 ENCOUNTER — Inpatient Hospital Stay: Payer: Medicare Other | Attending: Hematology & Oncology

## 2018-09-12 ENCOUNTER — Other Ambulatory Visit: Payer: Self-pay

## 2018-09-12 ENCOUNTER — Inpatient Hospital Stay: Payer: Medicare Other

## 2018-09-12 VITALS — BP 120/65 | HR 97 | Temp 98.0°F | Resp 18

## 2018-09-12 DIAGNOSIS — D751 Secondary polycythemia: Secondary | ICD-10-CM

## 2018-09-12 DIAGNOSIS — D45 Polycythemia vera: Secondary | ICD-10-CM

## 2018-09-12 DIAGNOSIS — D500 Iron deficiency anemia secondary to blood loss (chronic): Secondary | ICD-10-CM

## 2018-09-12 LAB — CMP (CANCER CENTER ONLY)
ALT: 15 U/L (ref 0–44)
AST: 10 U/L — ABNORMAL LOW (ref 15–41)
Albumin: 4.4 g/dL (ref 3.5–5.0)
Alkaline Phosphatase: 71 U/L (ref 38–126)
Anion gap: 6 (ref 5–15)
BILIRUBIN TOTAL: 0.4 mg/dL (ref 0.3–1.2)
BUN: 24 mg/dL — ABNORMAL HIGH (ref 8–23)
CO2: 27 mmol/L (ref 22–32)
Calcium: 10.3 mg/dL (ref 8.9–10.3)
Chloride: 102 mmol/L (ref 98–111)
Creatinine: 1.39 mg/dL — ABNORMAL HIGH (ref 0.61–1.24)
GFR, EST NON AFRICAN AMERICAN: 49 mL/min — AB (ref >60)
GFR, Est AFR Am: 56 mL/min — ABNORMAL LOW (ref >60)
Glucose, Bld: 266 mg/dL — ABNORMAL HIGH (ref 70–99)
Potassium: 4.5 mmol/L (ref 3.5–5.1)
Sodium: 135 mmol/L (ref 135–145)
Total Protein: 6.8 g/dL (ref 6.5–8.1)

## 2018-09-12 LAB — CBC WITH DIFFERENTIAL (CANCER CENTER ONLY)
Abs Immature Granulocytes: 0.07 10*3/uL (ref 0.00–0.07)
Basophils Absolute: 0.1 10*3/uL (ref 0.0–0.1)
Basophils Relative: 1 %
EOS ABS: 0.3 10*3/uL (ref 0.0–0.5)
Eosinophils Relative: 3 %
HEMATOCRIT: 50 % (ref 39.0–52.0)
Hemoglobin: 16.3 g/dL (ref 13.0–17.0)
Immature Granulocytes: 1 %
Lymphocytes Relative: 26 %
Lymphs Abs: 2.5 10*3/uL (ref 0.7–4.0)
MCH: 29.6 pg (ref 26.0–34.0)
MCHC: 32.6 g/dL (ref 30.0–36.0)
MCV: 90.7 fL (ref 80.0–100.0)
Monocytes Absolute: 0.7 10*3/uL (ref 0.1–1.0)
Monocytes Relative: 7 %
Neutro Abs: 6.1 10*3/uL (ref 1.7–7.7)
Neutrophils Relative %: 62 %
Platelet Count: 283 10*3/uL (ref 150–400)
RBC: 5.51 MIL/uL (ref 4.22–5.81)
RDW: 13.9 % (ref 11.5–15.5)
WBC Count: 9.8 10*3/uL (ref 4.0–10.5)
nRBC: 0 % (ref 0.0–0.2)

## 2018-09-12 NOTE — Patient Instructions (Signed)
Therapeutic Phlebotomy Therapeutic phlebotomy is the planned removal of blood from a person's body for the purpose of treating a medical condition. The procedure is similar to donating blood. Usually, about a pint (470 mL, or 0.47 L) of blood is removed. The average adult has 9-12 pints (4.3-5.7 L) of blood in the body. Therapeutic phlebotomy may be used to treat the following medical conditions:  Hemochromatosis. This is a condition in which the blood contains too much iron.  Polycythemia vera. This is a condition in which the blood contains too many red blood cells.  Porphyria cutanea tarda. This is a disease in which an important part of hemoglobin is not made properly. It results in the buildup of abnormal amounts of porphyrins in the body.  Sickle cell disease. This is a condition in which the red blood cells form an abnormal crescent shape rather than a round shape. Tell a health care provider about:  Any allergies you have.  All medicines you are taking, including vitamins, herbs, eye drops, creams, and over-the-counter medicines.  Any problems you or family members have had with anesthetic medicines.  Any blood disorders you have.  Any surgeries you have had.  Any medical conditions you have.  Whether you are pregnant or may be pregnant. What are the risks? Generally, this is a safe procedure. However, problems may occur, including:  Nausea or light-headedness.  Low blood pressure (hypotension).  Soreness, bleeding, swelling, or bruising at the needle insertion site.  Infection. What happens before the procedure?  Follow instructions from your health care provider about eating or drinking restrictions.  Ask your health care provider about: ? Changing or stopping your regular medicines. This is especially important if you are taking diabetes medicines or blood thinners (anticoagulants). ? Taking medicines such as aspirin and ibuprofen. These medicines can thin your  blood. Do not take these medicines unless your health care provider tells you to take them. ? Taking over-the-counter medicines, vitamins, herbs, and supplements.  Wear clothing with sleeves that can be raised above the elbow.  Plan to have someone take you home from the hospital or clinic.  You may have a blood sample taken.  Your blood pressure, pulse rate, and breathing rate will be measured. What happens during the procedure?   To lower your risk of infection: ? Your health care team will wash or sanitize their hands. ? Your skin will be cleaned with an antiseptic.  You may be given a medicine to numb the area (local anesthetic).  A tourniquet will be placed on your arm.  A needle will be inserted into one of your veins.  Tubing and a collection bag will be attached to that needle.  Blood will flow through the needle and tubing into the collection bag.  The collection bag will be placed lower than your arm to allow gravity to help the flow of blood into the bag.  You may be asked to open and close your hand slowly and continually during the entire collection.  After the specified amount of blood has been removed from your body, the collection bag and tubing will be clamped.  The needle will be removed from your vein.  Pressure will be held on the site of the needle insertion to stop the bleeding.  A bandage (dressing) will be placed over the needle insertion site. The procedure may vary among health care providers and hospitals. What happens after the procedure?  Your blood pressure, pulse rate, and breathing rate will be   measured after the procedure.  You will be encouraged to drink fluids.  Your recovery will be assessed and monitored.  You can return to your normal activities as told by your health care provider. Summary  Therapeutic phlebotomy is the planned removal of blood from a person's body for the purpose of treating a medical condition.  Therapeutic  phlebotomy may be used to treat hemochromatosis, polycythemia vera, porphyria cutanea tarda, or sickle cell disease.  In the procedure, a needle is inserted and about a pint (470 mL, or 0.47 L) of blood is removed. The average adult has 9-12 pints (4.3-5.7 L) of blood in the body.  This is generally a safe procedure, but it can sometimes cause problems such as nausea, light-headedness, or low blood pressure (hypotension). This information is not intended to replace advice given to you by your health care provider. Make sure you discuss any questions you have with your health care provider. Document Released: 12/04/2010 Document Revised: 07/18/2017 Document Reviewed: 07/18/2017 Elsevier Interactive Patient Education  2019 [employer] Inc[employer]  

## 2018-09-12 NOTE — Progress Notes (Signed)
Robert Tran presents today for phlebotomy per MD orders. Phlebotomy procedure started at 1145 via 16 g catheter and ended at 1155. 515 grams removed. Patient observed for 30 minutes after procedure without any incident. Patient tolerated procedure well. IV needle removed intact.

## 2018-09-15 LAB — IRON AND TIBC
Iron: 82 ug/dL (ref 42–163)
Saturation Ratios: 22 % (ref 20–55)
TIBC: 373 ug/dL (ref 202–409)
UIBC: 290 ug/dL (ref 117–376)

## 2018-09-15 LAB — FERRITIN: Ferritin: 10 ng/mL — ABNORMAL LOW (ref 24–336)

## 2018-11-14 ENCOUNTER — Inpatient Hospital Stay: Payer: Medicare Other

## 2018-11-14 ENCOUNTER — Inpatient Hospital Stay: Payer: Medicare Other | Attending: Hematology & Oncology | Admitting: Hematology & Oncology

## 2018-12-17 ENCOUNTER — Telehealth: Payer: Self-pay | Admitting: Hematology & Oncology

## 2018-12-17 NOTE — Telephone Encounter (Signed)
Returned call to patient requesting to r/s his missed appts from 5/1/ per 6/2 phone call from patient

## 2018-12-24 ENCOUNTER — Other Ambulatory Visit: Payer: Self-pay

## 2018-12-24 ENCOUNTER — Inpatient Hospital Stay (HOSPITAL_BASED_OUTPATIENT_CLINIC_OR_DEPARTMENT_OTHER): Payer: Medicare Other | Admitting: Family

## 2018-12-24 ENCOUNTER — Inpatient Hospital Stay: Payer: Medicare Other

## 2018-12-24 ENCOUNTER — Inpatient Hospital Stay: Payer: Medicare Other | Attending: Hematology & Oncology

## 2018-12-24 ENCOUNTER — Encounter: Payer: Self-pay | Admitting: Family

## 2018-12-24 VITALS — BP 118/59 | HR 90 | Temp 97.7°F | Resp 19 | Wt 168.1 lb

## 2018-12-24 DIAGNOSIS — D751 Secondary polycythemia: Secondary | ICD-10-CM

## 2018-12-24 DIAGNOSIS — I10 Essential (primary) hypertension: Secondary | ICD-10-CM | POA: Insufficient documentation

## 2018-12-24 DIAGNOSIS — D45 Polycythemia vera: Secondary | ICD-10-CM

## 2018-12-24 DIAGNOSIS — D500 Iron deficiency anemia secondary to blood loss (chronic): Secondary | ICD-10-CM

## 2018-12-24 LAB — CMP (CANCER CENTER ONLY)
ALT: 14 U/L (ref 0–44)
AST: 10 U/L — ABNORMAL LOW (ref 15–41)
Albumin: 4 g/dL (ref 3.5–5.0)
Alkaline Phosphatase: 57 U/L (ref 38–126)
Anion gap: 9 (ref 5–15)
BUN: 25 mg/dL — ABNORMAL HIGH (ref 8–23)
CO2: 22 mmol/L (ref 22–32)
Calcium: 9.9 mg/dL (ref 8.9–10.3)
Chloride: 103 mmol/L (ref 98–111)
Creatinine: 1.29 mg/dL — ABNORMAL HIGH (ref 0.61–1.24)
GFR, Est AFR Am: >60 mL/min (ref >60)
GFR, Estimated: 53 mL/min — ABNORMAL LOW (ref >60)
Glucose, Bld: 310 mg/dL — ABNORMAL HIGH (ref 70–99)
Potassium: 3.9 mmol/L (ref 3.5–5.1)
Sodium: 134 mmol/L — ABNORMAL LOW (ref 135–145)
Total Bilirubin: 0.4 mg/dL (ref 0.3–1.2)
Total Protein: 6.2 g/dL — ABNORMAL LOW (ref 6.5–8.1)

## 2018-12-24 LAB — CBC WITH DIFFERENTIAL (CANCER CENTER ONLY)
Abs Immature Granulocytes: 0.08 10*3/uL — ABNORMAL HIGH (ref 0.00–0.07)
Basophils Absolute: 0.1 10*3/uL (ref 0.0–0.1)
Basophils Relative: 1 %
Eosinophils Absolute: 0.3 10*3/uL (ref 0.0–0.5)
Eosinophils Relative: 3 %
HCT: 48.3 % (ref 39.0–52.0)
Hemoglobin: 15.7 g/dL (ref 13.0–17.0)
Immature Granulocytes: 1 %
Lymphocytes Relative: 26 %
Lymphs Abs: 2.4 10*3/uL (ref 0.7–4.0)
MCH: 29.5 pg (ref 26.0–34.0)
MCHC: 32.5 g/dL (ref 30.0–36.0)
MCV: 90.8 fL (ref 80.0–100.0)
Monocytes Absolute: 0.7 10*3/uL (ref 0.1–1.0)
Monocytes Relative: 7 %
Neutro Abs: 5.8 10*3/uL (ref 1.7–7.7)
Neutrophils Relative %: 62 %
Platelet Count: 266 10*3/uL (ref 150–400)
RBC: 5.32 MIL/uL (ref 4.22–5.81)
RDW: 14.2 % (ref 11.5–15.5)
WBC Count: 9.3 10*3/uL (ref 4.0–10.5)
nRBC: 0 % (ref 0.0–0.2)

## 2018-12-24 NOTE — Patient Instructions (Signed)
Therapeutic Phlebotomy Therapeutic phlebotomy is the planned removal of blood from a person's body for the purpose of treating a medical condition. The procedure is similar to donating blood. Usually, about a pint (470 mL, or 0.47 L) of blood is removed. The average adult has 9-12 pints (4.3-5.7 L) of blood in the body. Therapeutic phlebotomy may be used to treat the following medical conditions:  Hemochromatosis. This is a condition in which the blood contains too much iron.  Polycythemia vera. This is a condition in which the blood contains too many red blood cells.  Porphyria cutanea tarda. This is a disease in which an important part of hemoglobin is not made properly. It results in the buildup of abnormal amounts of porphyrins in the body.  Sickle cell disease. This is a condition in which the red blood cells form an abnormal crescent shape rather than a round shape. Tell a health care provider about:  Any allergies you have.  All medicines you are taking, including vitamins, herbs, eye drops, creams, and over-the-counter medicines.  Any problems you or family members have had with anesthetic medicines.  Any blood disorders you have.  Any surgeries you have had.  Any medical conditions you have.  Whether you are pregnant or may be pregnant. What are the risks? Generally, this is a safe procedure. However, problems may occur, including:  Nausea or light-headedness.  Low blood pressure (hypotension).  Soreness, bleeding, swelling, or bruising at the needle insertion site.  Infection. What happens before the procedure?  Follow instructions from your health care provider about eating or drinking restrictions.  Ask your health care provider about: ? Changing or stopping your regular medicines. This is especially important if you are taking diabetes medicines or blood thinners (anticoagulants). ? Taking medicines such as aspirin and ibuprofen. These medicines can thin your  blood. Do not take these medicines unless your health care provider tells you to take them. ? Taking over-the-counter medicines, vitamins, herbs, and supplements.  Wear clothing with sleeves that can be raised above the elbow.  Plan to have someone take you home from the hospital or clinic.  You may have a blood sample taken.  Your blood pressure, pulse rate, and breathing rate will be measured. What happens during the procedure?   To lower your risk of infection: ? Your health care team will wash or sanitize their hands. ? Your skin will be cleaned with an antiseptic.  You may be given a medicine to numb the area (local anesthetic).  A tourniquet will be placed on your arm.  A needle will be inserted into one of your veins.  Tubing and a collection bag will be attached to that needle.  Blood will flow through the needle and tubing into the collection bag.  The collection bag will be placed lower than your arm to allow gravity to help the flow of blood into the bag.  You may be asked to open and close your hand slowly and continually during the entire collection.  After the specified amount of blood has been removed from your body, the collection bag and tubing will be clamped.  The needle will be removed from your vein.  Pressure will be held on the site of the needle insertion to stop the bleeding.  A bandage (dressing) will be placed over the needle insertion site. The procedure may vary among health care providers and hospitals. What happens after the procedure?  Your blood pressure, pulse rate, and breathing rate will be   measured after the procedure.  You will be encouraged to drink fluids.  Your recovery will be assessed and monitored.  You can return to your normal activities as told by your health care provider. Summary  Therapeutic phlebotomy is the planned removal of blood from a person's body for the purpose of treating a medical condition.  Therapeutic  phlebotomy may be used to treat hemochromatosis, polycythemia vera, porphyria cutanea tarda, or sickle cell disease.  In the procedure, a needle is inserted and about a pint (470 mL, or 0.47 L) of blood is removed. The average adult has 9-12 pints (4.3-5.7 L) of blood in the body.  This is generally a safe procedure, but it can sometimes cause problems such as nausea, light-headedness, or low blood pressure (hypotension). This information is not intended to replace advice given to you by your health care provider. Make sure you discuss any questions you have with your health care provider. Document Released: 12/04/2010 Document Revised: 07/18/2017 Document Reviewed: 07/18/2017 Elsevier Interactive Patient Education  2019 [employer] Inc[employer]  

## 2018-12-24 NOTE — Progress Notes (Signed)
Hematology and Oncology Follow Up Visit  Robert Tran [id number] 05-24-1941 78 y.o. 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 He is doing well but states that he has recently had some issues with high blood sugar and HTN. His BP today is stable at 118/59 and HR 90.  Hct today is 48.3%.  He is on Plavix and has had no issue with bleeding, no unusual bruising or petechiae.  No fever, chills, n/v, cough, rash, dizziness, chest pain, palpitations, abdominal pain or changes in bowel or bladder habits.  He has occasional mild SOB when he bends over. This resolves once he stands up.   No swelling, tenderness, numbness or tingling in his extremities at this time. He will sometimes have numbness and tingling in his left leg. This comes and goes.  No lymphadenopathy noted on exam. He has maintained a good appetite and is staying well hydrated. His weight is stable.     ECOG Performance Status: 1 - Symptomatic but completely ambulatory  Medications:  Allergies as of 12/24/2018      Reactions   Pravastatin Sodium Other (See Comments)   Patient stated,"it messed up my back and lege, I couldn't walk."   Minocycline Nausea Only   Sulfa Antibiotics Nausea Only   Sulfa Drugs Cross Reactors Nausea Only      Medication List       Accurate as of December 24, 2018  1:14 PM. If you have any questions, ask your nurse or doctor.        albuterol 108 (90 Base) MCG/ACT inhaler Commonly known as:  VENTOLIN HFA Inhale into the lungs.   amLODipine 10 MG tablet Commonly known as:  NORVASC Take 10 mg by mouth daily.   Folic Acid 035 MCG Tabs Take 400 mcg by mouth every morning.   meclizine 25 MG tablet Commonly known as:  ANTIVERT Take 1 tablet (25 mg total) by mouth 3 (three) times daily as needed.   Plavix 75 MG  tablet Generic drug:  clopidogrel Take 75 mg by mouth daily.   ramipril 10 MG tablet Commonly known as:  ALTACE Take 10 mg by mouth daily.   torsemide 10 MG tablet Commonly known as:  DEMADEX Take by mouth daily as needed.   Vitamin D 50 MCG (2000 UT) tablet Take 5,000 Units by mouth daily. 03/08/15 taking 5000 u       Allergies:  Allergies  Allergen Reactions  . Pravastatin Sodium Other (See Comments)    Patient stated,"it messed up my back and lege, I couldn't walk."  . Minocycline Nausea Only  . Sulfa Antibiotics Nausea Only  . Sulfa Drugs Cross Reactors Nausea Only    Past Medical History, Surgical history, Social history, and Family History were reviewed and updated.  Review of Systems: All other 10 point review of systems is negative.   Physical Exam:  vitals were not taken for this visit.   Wt Readings from Last 3 Encounters:  07/18/18 169 lb 12.8 oz (77 kg)  05/09/18 172 lb (78 kg)  03/07/18 168 lb (76.2 kg)    Ocular: Sclerae unicteric, pupils equal, round and reactive to light Ear-nose-throat: Oropharynx clear, dentition fair Lymphatic: No cervical or supraclavicular adenopathy Lungs no rales or rhonchi, good excursion bilaterally Heart regular rate and rhythm, no murmur appreciated Abd  soft, nontender, positive bowel sounds, no liver or spleen tip palpated on exam, no fluid wave  MSK no focal spinal tenderness, no joint edema Neuro: non-focal, well-oriented, appropriate affect Breasts: Deferred   Lab Results  Component Value Date   WBC 9.8 09/12/2018   HGB 16.3 09/12/2018   HCT 50.0 09/12/2018   MCV 90.7 09/12/2018   PLT 283 09/12/2018   Lab Results  Component Value Date   FERRITIN 10 (L) 09/12/2018   IRON 82 09/12/2018   TIBC 373 09/12/2018   UIBC 290 09/12/2018   IRONPCTSAT 22 09/12/2018   Lab Results  Component Value Date   RETICCTPCT 1.4 06/20/2015   RBC 5.51 09/12/2018   RETICCTABS 77.3 06/20/2015   No results found for:  KPAFRELGTCHN, LAMBDASER, KAPLAMBRATIO No results found for: IGGSERUM, IGA, IGMSERUM No results found for: [name], SPEI   Chemistry      Component Value Date/Time   NA 135 09/12/2018 1113   NA 142 07/18/2017 1421   NA 134 (L) 08/09/2016 1313   K 4.5 09/12/2018 1113   K 4.0 07/18/2017 1421   K 4.2 08/09/2016 1313   CL 102 09/12/2018 1113   CL 103 07/18/2017 1421   CO2 27 09/12/2018 1113   CO2 29 07/18/2017 1421   CO2 20 (L) 08/09/2016 1313   BUN 24 (H) 09/12/2018 1113   BUN 19 07/18/2017 1421   BUN 19.9 08/09/2016 1313   CREATININE 1.39 (H) 09/12/2018 1113   CREATININE 1.4 (H) 07/18/2017 1421   CREATININE 1.3 08/09/2016 1313      Component Value Date/Time   CALCIUM 10.3 09/12/2018 1113   CALCIUM 10.2 07/18/2017 1421   CALCIUM 10.0 08/09/2016 1313   ALKPHOS 71 09/12/2018 1113   ALKPHOS 65 07/18/2017 1421   ALKPHOS 75 08/09/2016 1313   AST 10 (L) 09/12/2018 1113   AST 14 08/09/2016 1313   ALT 15 09/12/2018 1113   ALT 21 07/18/2017 1421   ALT 23 08/09/2016 1313   BILITOT 0.4 09/12/2018 1113   BILITOT 0.34 08/09/2016 1313       Impression and Plan: Robert Tran is a very pleasant 78 yo caucasian gentleman with secondary polycythemia (smoking). Hct is 48.3% so we did go ahead with phlebotomy today.  His blood sugar came back high at 310. He did eat lunch prior to coming in. I have routed his lab work to his PCP for further management of his Metformin.  We will continue to see him every 8 weeks for lab and phlebotomy if needed and follow-up in 4 months.  He will contact our office with nay questions or concerns. We can certainly see him sooner if need be.   Robert Peace, NP 6/10/20201:14 PM

## 2018-12-25 LAB — IRON AND TIBC
Iron: 101 ug/dL (ref 42–163)
Saturation Ratios: 29 % (ref 20–55)
TIBC: 346 ug/dL (ref 202–409)
UIBC: 246 ug/dL (ref 117–376)

## 2018-12-25 LAB — FERRITIN: Ferritin: 13 ng/mL — ABNORMAL LOW (ref 24–336)

## 2018-12-28 DIAGNOSIS — Z5320 Procedure and treatment not carried out because of patient's decision for unspecified reasons: Secondary | ICD-10-CM | POA: Insufficient documentation

## 2019-02-18 ENCOUNTER — Inpatient Hospital Stay: Payer: Medicare Other

## 2019-02-18 ENCOUNTER — Other Ambulatory Visit: Payer: Self-pay

## 2019-02-18 ENCOUNTER — Inpatient Hospital Stay: Payer: Medicare Other | Attending: Hematology & Oncology

## 2019-02-18 VITALS — BP 134/81 | HR 96 | Temp 97.1°F | Resp 18

## 2019-02-18 DIAGNOSIS — D500 Iron deficiency anemia secondary to blood loss (chronic): Secondary | ICD-10-CM

## 2019-02-18 DIAGNOSIS — D45 Polycythemia vera: Secondary | ICD-10-CM

## 2019-02-18 DIAGNOSIS — D751 Secondary polycythemia: Secondary | ICD-10-CM

## 2019-02-18 LAB — CMP (CANCER CENTER ONLY)
ALT: 14 U/L (ref 0–44)
AST: 10 U/L — ABNORMAL LOW (ref 15–41)
Albumin: 4.1 g/dL (ref 3.5–5.0)
Alkaline Phosphatase: 60 U/L (ref 38–126)
Anion gap: 8 (ref 5–15)
BUN: 32 mg/dL — ABNORMAL HIGH (ref 8–23)
CO2: 25 mmol/L (ref 22–32)
Calcium: 9.7 mg/dL (ref 8.9–10.3)
Chloride: 104 mmol/L (ref 98–111)
Creatinine: 1.59 mg/dL — ABNORMAL HIGH (ref 0.61–1.24)
GFR, Est AFR Am: 47 mL/min — ABNORMAL LOW (ref >60)
GFR, Estimated: 41 mL/min — ABNORMAL LOW (ref >60)
Glucose, Bld: 194 mg/dL — ABNORMAL HIGH (ref 70–99)
Potassium: 4.2 mmol/L (ref 3.5–5.1)
Sodium: 137 mmol/L (ref 135–145)
Total Bilirubin: 0.4 mg/dL (ref 0.3–1.2)
Total Protein: 6.5 g/dL (ref 6.5–8.1)

## 2019-02-18 LAB — CBC WITH DIFFERENTIAL (CANCER CENTER ONLY)
Abs Immature Granulocytes: 0.08 10*3/uL — ABNORMAL HIGH (ref 0.00–0.07)
Basophils Absolute: 0.1 10*3/uL (ref 0.0–0.1)
Basophils Relative: 1 %
Eosinophils Absolute: 0.3 10*3/uL (ref 0.0–0.5)
Eosinophils Relative: 3 %
HCT: 49.9 % (ref 39.0–52.0)
Hemoglobin: 16.2 g/dL (ref 13.0–17.0)
Immature Granulocytes: 1 %
Lymphocytes Relative: 27 %
Lymphs Abs: 2.6 10*3/uL (ref 0.7–4.0)
MCH: 29.8 pg (ref 26.0–34.0)
MCHC: 32.5 g/dL (ref 30.0–36.0)
MCV: 91.9 fL (ref 80.0–100.0)
Monocytes Absolute: 0.6 10*3/uL (ref 0.1–1.0)
Monocytes Relative: 7 %
Neutro Abs: 5.9 10*3/uL (ref 1.7–7.7)
Neutrophils Relative %: 61 %
Platelet Count: 330 10*3/uL (ref 150–400)
RBC: 5.43 MIL/uL (ref 4.22–5.81)
RDW: 14.3 % (ref 11.5–15.5)
WBC Count: 9.6 10*3/uL (ref 4.0–10.5)
nRBC: 0 % (ref 0.0–0.2)

## 2019-02-18 NOTE — Progress Notes (Signed)
Robert Tran presents today for phlebotomy per MD orders. Phlebotomy procedure started at 1300 and ended at 1308. 525 cc removed via 16 g needle at L antecubital site. Patient tolerated procedure well.

## 2019-02-19 LAB — IRON AND TIBC
Iron: 78 ug/dL (ref 42–163)
Saturation Ratios: 19 % — ABNORMAL LOW (ref 20–55)
TIBC: 406 ug/dL (ref 202–409)
UIBC: 329 ug/dL (ref 117–376)

## 2019-02-19 LAB — FERRITIN: Ferritin: 12 ng/mL — ABNORMAL LOW (ref 24–336)

## 2019-04-15 ENCOUNTER — Other Ambulatory Visit: Payer: Medicare Other

## 2019-04-29 ENCOUNTER — Other Ambulatory Visit: Payer: Self-pay

## 2019-04-29 ENCOUNTER — Inpatient Hospital Stay (HOSPITAL_BASED_OUTPATIENT_CLINIC_OR_DEPARTMENT_OTHER): Payer: Medicare Other | Admitting: Hematology & Oncology

## 2019-04-29 ENCOUNTER — Inpatient Hospital Stay: Payer: Medicare Other

## 2019-04-29 ENCOUNTER — Inpatient Hospital Stay: Payer: Medicare Other | Attending: Hematology & Oncology

## 2019-04-29 ENCOUNTER — Encounter: Payer: Self-pay | Admitting: Hematology & Oncology

## 2019-04-29 ENCOUNTER — Telehealth: Payer: Self-pay | Admitting: Hematology & Oncology

## 2019-04-29 VITALS — BP 140/70 | HR 96 | Temp 97.8°F | Resp 19 | Ht 65.0 in | Wt 170.4 lb

## 2019-04-29 DIAGNOSIS — D45 Polycythemia vera: Secondary | ICD-10-CM

## 2019-04-29 DIAGNOSIS — D751 Secondary polycythemia: Secondary | ICD-10-CM

## 2019-04-29 DIAGNOSIS — D500 Iron deficiency anemia secondary to blood loss (chronic): Secondary | ICD-10-CM

## 2019-04-29 LAB — CBC WITH DIFFERENTIAL (CANCER CENTER ONLY)
Abs Immature Granulocytes: 0.06 10*3/uL (ref 0.00–0.07)
Basophils Absolute: 0.1 10*3/uL (ref 0.0–0.1)
Basophils Relative: 1 %
Eosinophils Absolute: 0.4 10*3/uL (ref 0.0–0.5)
Eosinophils Relative: 4 %
HCT: 47.6 % (ref 39.0–52.0)
Hemoglobin: 15.5 g/dL (ref 13.0–17.0)
Immature Granulocytes: 1 %
Lymphocytes Relative: 27 %
Lymphs Abs: 2.6 10*3/uL (ref 0.7–4.0)
MCH: 29.5 pg (ref 26.0–34.0)
MCHC: 32.6 g/dL (ref 30.0–36.0)
MCV: 90.7 fL (ref 80.0–100.0)
Monocytes Absolute: 0.8 10*3/uL (ref 0.1–1.0)
Monocytes Relative: 8 %
Neutro Abs: 5.6 10*3/uL (ref 1.7–7.7)
Neutrophils Relative %: 59 %
Platelet Count: 287 10*3/uL (ref 150–400)
RBC: 5.25 MIL/uL (ref 4.22–5.81)
RDW: 13.7 % (ref 11.5–15.5)
WBC Count: 9.4 10*3/uL (ref 4.0–10.5)
nRBC: 0 % (ref 0.0–0.2)

## 2019-04-29 LAB — CMP (CANCER CENTER ONLY)
ALT: 13 U/L (ref 0–44)
AST: 10 U/L — ABNORMAL LOW (ref 15–41)
Albumin: 4.1 g/dL (ref 3.5–5.0)
Alkaline Phosphatase: 57 U/L (ref 38–126)
Anion gap: 7 (ref 5–15)
BUN: 23 mg/dL (ref 8–23)
CO2: 24 mmol/L (ref 22–32)
Calcium: 10.1 mg/dL (ref 8.9–10.3)
Chloride: 105 mmol/L (ref 98–111)
Creatinine: 1.29 mg/dL — ABNORMAL HIGH (ref 0.61–1.24)
GFR, Est AFR Am: >60 mL/min (ref >60)
GFR, Estimated: 53 mL/min — ABNORMAL LOW (ref >60)
Glucose, Bld: 216 mg/dL — ABNORMAL HIGH (ref 70–99)
Potassium: 4 mmol/L (ref 3.5–5.1)
Sodium: 136 mmol/L (ref 135–145)
Total Bilirubin: 0.3 mg/dL (ref 0.3–1.2)
Total Protein: 6.5 g/dL (ref 6.5–8.1)

## 2019-04-29 NOTE — Progress Notes (Signed)
Hematology and Oncology Follow Up Visit  Robert Tran [id number] Feb 13, 1941 78 y.o. 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  He is doing okay.  He is wife are both still working.  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  The big news is that he is wife now have a new RV parent is 26 for long.  It apparently is incredibly victorious.  They travel over time in this.  They actually are going out to [place].  He has been okay.  He is still smoking.  I am sure he will ever stop he understands the consequences.  He is trying to cut back on his coffee.  They did sell their beach house.  He has had no problems with bleeding.  There is been no change in bowel or bladder habits.  His blood sugars have been on the high side.  He said his last hemoglobin A1c was only 6.4.  There is been no nausea or vomiting.  He has had no cough.  There is been no leg swelling.  Overall, his performance status is ECOG 1.   Medications:  Allergies as of 04/29/2019      Reactions   Pravastatin Sodium Other (See Comments)   Patient stated,"it messed up my back and lege, I couldn't walk."   Minocycline Nausea Only   Sulfa Antibiotics Nausea Only   Sulfa Drugs Cross Reactors Nausea Only      Medication List       Accurate as of April 29, 2019 12:36 PM. If you have any questions, ask your nurse or doctor.        albuterol 108 (90 Base) MCG/ACT inhaler Commonly known as: VENTOLIN HFA Inhale into the lungs.   amLODipine 10 MG tablet Commonly known as: NORVASC Take 10 mg by mouth daily.   fluocinonide 0.05 % external solution Commonly known as: LIDEX   Folic Acid 509 MCG Tabs Take 400 mcg by mouth every morning.   meclizine 25 MG tablet Commonly known as: ANTIVERT Take 1 tablet (25 mg total) by mouth 3 (three)  times daily as needed.   metFORMIN 500 MG 24 hr tablet Commonly known as: GLUCOPHAGE-XR Take by mouth.   Plavix 75 MG tablet Generic drug: clopidogrel Take 75 mg by mouth daily.   ramipril 10 MG tablet Commonly known as: ALTACE Take 10 mg by mouth daily.   torsemide 10 MG tablet Commonly known as: DEMADEX Take by mouth daily as needed.   Vitamin D 50 MCG (2000 UT) tablet Take 5,000 Units by mouth daily. 03/08/15 taking 5000 u       Allergies:  Allergies  Allergen Reactions  . Pravastatin Sodium Other (See Comments)    Patient stated,"it messed up my back and lege, I couldn't walk."  . Minocycline Nausea Only  . Sulfa Antibiotics Nausea Only  . Sulfa Drugs Cross Reactors Nausea Only    Past Medical History, Surgical history, Social history, and Family History were reviewed and updated.  Review of Systems: Review of Systems  Constitutional: Negative.   HENT: Negative.   Eyes: Negative.   Respiratory: Negative.   Cardiovascular: Negative.   Gastrointestinal: Negative.   Genitourinary: Negative.   Musculoskeletal: Negative.   Skin: Negative.   Neurological: Negative.   Endo/Heme/Allergies: Negative.   Psychiatric/Behavioral: Negative.  Physical Exam:  height is 5\' 5"  (1.651 m) and weight is 170 lb 6.4 oz (77.3 kg). Her temporal temperature is 97.8 F (36.6 C). Her blood pressure is 140/70 and her pulse is 96. Her respiration is 19 and oxygen saturation is 98%.   Wt Readings from Last 3 Encounters:  04/29/19 170 lb 6.4 oz (77.3 kg)  12/24/18 168 lb 1.9 oz (76.3 kg)  07/18/18 169 lb 12.8 oz (77 kg)    Physical Exam Vitals signs reviewed.  HENT:     Head: Normocephalic and atraumatic.  Eyes:     Pupils: Pupils are equal, round, and reactive to light.  Neck:     Musculoskeletal: Normal range of motion.  Cardiovascular:     Rate and Rhythm: Normal rate and regular rhythm.     Heart sounds: Normal heart sounds.  Pulmonary:     Effort: Pulmonary effort  is normal.     Breath sounds: Normal breath sounds.  Abdominal:     General: Bowel sounds are normal.     Palpations: Abdomen is soft.  Musculoskeletal: Normal range of motion.        General: No tenderness or deformity.  Lymphadenopathy:     Cervical: No cervical adenopathy.  Skin:    General: Skin is warm and dry.     Findings: No erythema or rash.  Neurological:     Mental Status: She is alert and oriented to person, place, and time.  Psychiatric:        Behavior: Behavior normal.        Thought Content: Thought content normal.        Judgment: Judgment normal.      Lab Results  Component Value Date   WBC 9.4 04/29/2019   HGB 15.5 04/29/2019   HCT 47.6 04/29/2019   MCV 90.7 04/29/2019   PLT 287 04/29/2019   Lab Results  Component Value Date   FERRITIN 12 (L) 02/18/2019   IRON 78 02/18/2019   TIBC 406 02/18/2019   UIBC 329 02/18/2019   IRONPCTSAT 19 (L) 02/18/2019   Lab Results  Component Value Date   RETICCTPCT 1.4 06/20/2015   RBC 5.25 04/29/2019   RETICCTABS 77.3 06/20/2015   No results found for: KPAFRELGTCHN, LAMBDASER, KAPLAMBRATIO No results found for: Kandis Cocking, IGMSERUM No results found for: [name], SPEI   Chemistry      Component Value Date/Time   NA 136 04/29/2019 1159   NA 142 07/18/2017 1421   NA 134 (L) 08/09/2016 1313   K 4.0 04/29/2019 1159   K 4.0 07/18/2017 1421   K 4.2 08/09/2016 1313   CL 105 04/29/2019 1159   CL 103 07/18/2017 1421   CO2 24 04/29/2019 1159   CO2 29 07/18/2017 1421   CO2 20 (L) 08/09/2016 1313   BUN 23 04/29/2019 1159   BUN 19 07/18/2017 1421   BUN 19.9 08/09/2016 1313   CREATININE 1.29 (H) 04/29/2019 1159   CREATININE 1.4 (H) 07/18/2017 1421   CREATININE 1.3 08/09/2016 1313      Component Value Date/Time   CALCIUM 10.1 04/29/2019 1159   CALCIUM 10.2 07/18/2017 1421   CALCIUM 10.0 08/09/2016 1313   ALKPHOS 57 04/29/2019 1159   ALKPHOS 65  07/18/2017 1421   ALKPHOS 75 08/09/2016 1313   AST 10 (L) 04/29/2019 1159   AST 14 08/09/2016 1313   ALT 13 04/29/2019 1159   ALT 21 07/18/2017 1421   ALT 23 08/09/2016 1313  BILITOT 0.3 04/29/2019 1159   BILITOT 0.34 08/09/2016 1313      Impression and Plan: Robert Tran is a 78 year old male. He has polycythemia. 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 However, phlebotomizing him definitely makes him feel better.  He does not want a phlebotomy today.  He says he feels good.  We will plan to get him back in 3 months.  We will get him through the holiday season.  I am sure we probably will have to phlebotomize him when we see him back.   Volanda Napoleon, MD 10/14/202012:36 PM

## 2019-04-29 NOTE — Telephone Encounter (Signed)
Spoke with patient to confirm Jan appt per 10/14 LOS

## 2019-04-30 LAB — IRON AND TIBC
Iron: 61 ug/dL (ref 42–163)
Saturation Ratios: 16 % — ABNORMAL LOW (ref 20–55)
TIBC: 378 ug/dL (ref 202–409)
UIBC: 316 ug/dL (ref 117–376)

## 2019-04-30 LAB — FERRITIN: Ferritin: 12 ng/mL — ABNORMAL LOW (ref 24–336)

## 2019-07-30 ENCOUNTER — Encounter: Payer: Self-pay | Admitting: Hematology & Oncology

## 2019-07-30 ENCOUNTER — Telehealth: Payer: Self-pay | Admitting: Hematology & Oncology

## 2019-07-30 ENCOUNTER — Other Ambulatory Visit: Payer: Self-pay

## 2019-07-30 ENCOUNTER — Inpatient Hospital Stay: Payer: Medicare Other

## 2019-07-30 ENCOUNTER — Inpatient Hospital Stay: Payer: Medicare Other | Attending: Hematology & Oncology | Admitting: Hematology & Oncology

## 2019-07-30 VITALS — BP 118/64 | HR 100 | Temp 97.7°F | Resp 20 | Wt 171.0 lb

## 2019-07-30 DIAGNOSIS — D45 Polycythemia vera: Secondary | ICD-10-CM | POA: Diagnosis present

## 2019-07-30 DIAGNOSIS — Z7902 Long term (current) use of antithrombotics/antiplatelets: Secondary | ICD-10-CM | POA: Diagnosis not present

## 2019-07-30 DIAGNOSIS — D500 Iron deficiency anemia secondary to blood loss (chronic): Secondary | ICD-10-CM

## 2019-07-30 LAB — CBC WITH DIFFERENTIAL (CANCER CENTER ONLY)
Abs Immature Granulocytes: 0.08 10*3/uL — ABNORMAL HIGH (ref 0.00–0.07)
Basophils Absolute: 0.1 10*3/uL (ref 0.0–0.1)
Basophils Relative: 1 %
Eosinophils Absolute: 0.2 10*3/uL (ref 0.0–0.5)
Eosinophils Relative: 3 %
HCT: 49 % (ref 39.0–52.0)
Hemoglobin: 16.1 g/dL (ref 13.0–17.0)
Immature Granulocytes: 1 %
Lymphocytes Relative: 25 %
Lymphs Abs: 2.1 10*3/uL (ref 0.7–4.0)
MCH: 30.1 pg (ref 26.0–34.0)
MCHC: 32.9 g/dL (ref 30.0–36.0)
MCV: 91.6 fL (ref 80.0–100.0)
Monocytes Absolute: 0.5 10*3/uL (ref 0.1–1.0)
Monocytes Relative: 6 %
Neutro Abs: 5.4 10*3/uL (ref 1.7–7.7)
Neutrophils Relative %: 64 %
Platelet Count: 281 10*3/uL (ref 150–400)
RBC: 5.35 MIL/uL (ref 4.22–5.81)
RDW: 14.5 % (ref 11.5–15.5)
WBC Count: 8.4 10*3/uL (ref 4.0–10.5)
nRBC: 0 % (ref 0.0–0.2)

## 2019-07-30 LAB — CMP (CANCER CENTER ONLY)
ALT: 14 U/L (ref 0–44)
AST: 10 U/L — ABNORMAL LOW (ref 15–41)
Albumin: 4.1 g/dL (ref 3.5–5.0)
Alkaline Phosphatase: 54 U/L (ref 38–126)
Anion gap: 8 (ref 5–15)
BUN: 25 mg/dL — ABNORMAL HIGH (ref 8–23)
CO2: 25 mmol/L (ref 22–32)
Calcium: 9.9 mg/dL (ref 8.9–10.3)
Chloride: 102 mmol/L (ref 98–111)
Creatinine: 1.41 mg/dL — ABNORMAL HIGH (ref 0.61–1.24)
GFR, Est AFR Am: 55 mL/min — ABNORMAL LOW (ref >60)
GFR, Estimated: 47 mL/min — ABNORMAL LOW (ref >60)
Glucose, Bld: 449 mg/dL — ABNORMAL HIGH (ref 70–99)
Potassium: 4.3 mmol/L (ref 3.5–5.1)
Sodium: 135 mmol/L (ref 135–145)
Total Bilirubin: 0.4 mg/dL (ref 0.3–1.2)
Total Protein: 6.1 g/dL — ABNORMAL LOW (ref 6.5–8.1)

## 2019-07-30 LAB — HEMOGLOBIN A1C
Hgb A1c MFr Bld: 7.8 % — ABNORMAL HIGH (ref 4.8–5.6)
Mean Plasma Glucose: 177.16 mg/dL

## 2019-07-30 NOTE — Progress Notes (Signed)
Hematology and Oncology Follow Up Visit  Jams Trickett [id number] 28-Jul-1940 79 y.o. 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 Difatta is back for follow-up.  He is doing okay.  Overall, things are going quite well for him.  They sold their beach house.  They now have a 40 foot RV.  The next big trip is down to [place], [place] to see where [place] Musty was born.  I am sure that they will actually see Elvis when they are down there.  His last phlebotomy I think was back in August.  His iron studies have been doing okay.  Back in October when we last saw him, his ferritin was 12 with an iron saturation of 16%.  He feels well.  He has had no headache.  He has had no itchiness.  He has had no cough or shortness of breath.  He is still smoking.  He still drinks is 2-3 cups of coffee a day.  He did have a nice holiday season.  One of her daughters came in from [place].  Overall, his performance status is ECOG 1.    Medications:  Allergies as of 07/30/2019      Reactions   Pravastatin Sodium Other (See Comments)   Patient stated,"it messed up my back and lege, I couldn't walk."   Minocycline Nausea Only   Sulfa Antibiotics Nausea Only   Sulfa Drugs Cross Reactors Nausea Only      Medication List       Accurate as of July 30, 2019 12:04 PM. If you have any questions, ask your nurse or doctor.        albuterol 108 (90 Base) MCG/ACT inhaler Commonly known as: VENTOLIN HFA Inhale into the lungs.   amLODipine 10 MG tablet Commonly known as: NORVASC Take 10 mg by mouth daily.   fluocinonide 0.05 % external solution Commonly known as: LIDEX Apply 1 application topically daily.   folic acid 355 MCG tablet Commonly known as: FOLVITE Take 400 mcg by mouth every morning.   meclizine 25 MG tablet Commonly known as: ANTIVERT Take 1  tablet (25 mg total) by mouth 3 (three) times daily as needed.   metFORMIN 500 MG 24 hr tablet Commonly known as: GLUCOPHAGE-XR Take by mouth.   Plavix 75 MG tablet Generic drug: clopidogrel Take 75 mg by mouth daily.   ramipril 10 MG tablet Commonly known as: ALTACE Take 10 mg by mouth daily.   torsemide 10 MG tablet Commonly known as: DEMADEX Take by mouth daily as needed.   Vitamin D 125 MCG (5000 UT) Caps Take 5,000 Units by mouth daily. 03/08/15 taking 5000 u       Allergies:  Allergies  Allergen Reactions  . Pravastatin Sodium Other (See Comments)    Patient stated,"it messed up my back and lege, I couldn't walk."  . Minocycline Nausea Only  . Sulfa Antibiotics Nausea Only  . Sulfa Drugs Cross Reactors Nausea Only    Past Medical History, Surgical history, Social history, and Family History were reviewed and updated.  Review of Systems: Review of Systems  Constitutional: Negative.   HENT: Negative.   Eyes: Negative.   Respiratory: Negative.   Cardiovascular: Negative.   Gastrointestinal: Negative.   Genitourinary: Negative.   Musculoskeletal: Negative.   Skin: Negative.   Neurological: Negative.   Endo/Heme/Allergies: Negative.  Psychiatric/Behavioral: Negative.     Physical Exam:  weight is 171 lb (77.6 kg). Her temporal temperature is 97.7 F (36.5 C). Her blood pressure is 118/64 and her pulse is 100. Her respiration is 20 and oxygen saturation is 98%.   Wt Readings from Last 3 Encounters:  07/30/19 171 lb (77.6 kg)  04/29/19 170 lb 6.4 oz (77.3 kg)  12/24/18 168 lb 1.9 oz (76.3 kg)    Physical Exam Vitals reviewed.  HENT:     Head: Normocephalic and atraumatic.  Eyes:     Pupils: Pupils are equal, round, and reactive to light.  Cardiovascular:     Rate and Rhythm: Normal rate and regular rhythm.     Heart sounds: Normal heart sounds.  Pulmonary:     Effort: Pulmonary effort is normal.     Breath sounds: Normal breath sounds.    Abdominal:     General: Bowel sounds are normal.     Palpations: Abdomen is soft.  Musculoskeletal:        General: No tenderness or deformity. Normal range of motion.     Cervical back: Normal range of motion.  Lymphadenopathy:     Cervical: No cervical adenopathy.  Skin:    General: Skin is warm and dry.     Findings: No erythema or rash.  Neurological:     Mental Status: She is alert and oriented to person, place, and time.  Psychiatric:        Behavior: Behavior normal.        Thought Content: Thought content normal.        Judgment: Judgment normal.      Lab Results  Component Value Date   WBC 8.4 07/30/2019   HGB 16.1 07/30/2019   HCT 49.0 07/30/2019   MCV 91.6 07/30/2019   PLT 281 07/30/2019   Lab Results  Component Value Date   FERRITIN 12 (L) 04/29/2019   IRON 61 04/29/2019   TIBC 378 04/29/2019   UIBC 316 04/29/2019   IRONPCTSAT 16 (L) 04/29/2019   Lab Results  Component Value Date   RETICCTPCT 1.4 06/20/2015   RBC 5.35 07/30/2019   RETICCTABS 77.3 06/20/2015   No results found for: KPAFRELGTCHN, LAMBDASER, KAPLAMBRATIO No results found for: IGGSERUM, IGA, IGMSERUM No results found for: [name], SPEI   Chemistry      Component Value Date/Time   NA 135 07/30/2019 1100   NA 142 07/18/2017 1421   NA 134 (L) 08/09/2016 1313   K 4.3 07/30/2019 1100   K 4.0 07/18/2017 1421   K 4.2 08/09/2016 1313   CL 102 07/30/2019 1100   CL 103 07/18/2017 1421   CO2 25 07/30/2019 1100   CO2 29 07/18/2017 1421   CO2 20 (L) 08/09/2016 1313   BUN 25 (H) 07/30/2019 1100   BUN 19 07/18/2017 1421   BUN 19.9 08/09/2016 1313   CREATININE 1.41 (H) 07/30/2019 1100   CREATININE 1.4 (H) 07/18/2017 1421   CREATININE 1.3 08/09/2016 1313      Component Value Date/Time   CALCIUM 9.9 07/30/2019 1100   CALCIUM 10.2 07/18/2017 1421   CALCIUM 10.0 08/09/2016 1313   ALKPHOS 54 07/30/2019 1100   ALKPHOS 65 07/18/2017  1421   ALKPHOS 75 08/09/2016 1313   AST 10 (L) 07/30/2019 1100   AST 14 08/09/2016 1313   ALT 14 07/30/2019 1100   ALT 21 07/18/2017 1421   ALT 23 08/09/2016 1313   BILITOT 0.4 07/30/2019  1100   BILITOT 0.34 08/09/2016 1313      Impression and Plan: Mr. Derousse is a 79 year old male. He has polycythemia. 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 However, phlebotomizing him definitely makes him feel better.  He does not want a phlebotomy today.  He says he feels good.  We will plan to get him back in 3 months.  We will get him through the winter season.  I am sure we probably will have to phlebotomize him when we see him back.   Volanda Napoleon, MD 1/14/202112:04 PM

## 2019-07-30 NOTE — Telephone Encounter (Signed)
Called and spoke with patient regarding appointments added. Calendar/letter mailed per 1/14

## 2019-07-31 LAB — IRON AND TIBC
Iron: 84 ug/dL (ref 42–163)
Saturation Ratios: 26 % (ref 20–55)
TIBC: 330 ug/dL (ref 202–409)
UIBC: 246 ug/dL (ref 117–376)

## 2019-07-31 LAB — FERRITIN: Ferritin: 16 ng/mL — ABNORMAL LOW (ref 24–336)

## 2019-09-01 DIAGNOSIS — I7300 Raynaud's syndrome without gangrene: Secondary | ICD-10-CM | POA: Insufficient documentation

## 2019-09-11 ENCOUNTER — Encounter: Payer: Self-pay | Admitting: Hematology & Oncology

## 2019-09-11 ENCOUNTER — Other Ambulatory Visit: Payer: Self-pay

## 2019-09-11 ENCOUNTER — Inpatient Hospital Stay: Payer: Medicare Other | Attending: Hematology & Oncology

## 2019-09-11 ENCOUNTER — Inpatient Hospital Stay (HOSPITAL_BASED_OUTPATIENT_CLINIC_OR_DEPARTMENT_OTHER): Payer: Medicare Other | Admitting: Hematology & Oncology

## 2019-09-11 ENCOUNTER — Inpatient Hospital Stay: Payer: Medicare Other

## 2019-09-11 VITALS — BP 112/77 | HR 97 | Temp 96.9°F | Resp 18 | Wt 170.0 lb

## 2019-09-11 VITALS — BP 108/73 | HR 97 | Resp 18

## 2019-09-11 DIAGNOSIS — F17210 Nicotine dependence, cigarettes, uncomplicated: Secondary | ICD-10-CM | POA: Insufficient documentation

## 2019-09-11 DIAGNOSIS — D45 Polycythemia vera: Secondary | ICD-10-CM | POA: Diagnosis not present

## 2019-09-11 LAB — CBC WITH DIFFERENTIAL (CANCER CENTER ONLY)
Abs Immature Granulocytes: 0.07 10*3/uL (ref 0.00–0.07)
Basophils Absolute: 0.1 10*3/uL (ref 0.0–0.1)
Basophils Relative: 1 %
Eosinophils Absolute: 0.2 10*3/uL (ref 0.0–0.5)
Eosinophils Relative: 3 %
HCT: 51 % (ref 39.0–52.0)
Hemoglobin: 16.9 g/dL (ref 13.0–17.0)
Immature Granulocytes: 1 %
Lymphocytes Relative: 24 %
Lymphs Abs: 2.3 10*3/uL (ref 0.7–4.0)
MCH: 30.7 pg (ref 26.0–34.0)
MCHC: 33.1 g/dL (ref 30.0–36.0)
MCV: 92.7 fL (ref 80.0–100.0)
Monocytes Absolute: 0.6 10*3/uL (ref 0.1–1.0)
Monocytes Relative: 7 %
Neutro Abs: 6.1 10*3/uL (ref 1.7–7.7)
Neutrophils Relative %: 64 %
Platelet Count: 316 10*3/uL (ref 150–400)
RBC: 5.5 MIL/uL (ref 4.22–5.81)
RDW: 13.3 % (ref 11.5–15.5)
WBC Count: 9.4 10*3/uL (ref 4.0–10.5)
nRBC: 0 % (ref 0.0–0.2)

## 2019-09-11 LAB — CMP (CANCER CENTER ONLY)
ALT: 15 U/L (ref 0–44)
AST: 11 U/L — ABNORMAL LOW (ref 15–41)
Albumin: 4.5 g/dL (ref 3.5–5.0)
Alkaline Phosphatase: 64 U/L (ref 38–126)
Anion gap: 7 (ref 5–15)
BUN: 34 mg/dL — ABNORMAL HIGH (ref 8–23)
CO2: 28 mmol/L (ref 22–32)
Calcium: 11 mg/dL — ABNORMAL HIGH (ref 8.9–10.3)
Chloride: 103 mmol/L (ref 98–111)
Creatinine: 1.49 mg/dL — ABNORMAL HIGH (ref 0.61–1.24)
GFR, Est AFR Am: 51 mL/min — ABNORMAL LOW (ref >60)
GFR, Estimated: 44 mL/min — ABNORMAL LOW (ref >60)
Glucose, Bld: 211 mg/dL — ABNORMAL HIGH (ref 70–99)
Potassium: 4.7 mmol/L (ref 3.5–5.1)
Sodium: 138 mmol/L (ref 135–145)
Total Bilirubin: 0.4 mg/dL (ref 0.3–1.2)
Total Protein: 7 g/dL (ref 6.5–8.1)

## 2019-09-11 NOTE — Patient Instructions (Signed)
Therapeutic Phlebotomy Therapeutic phlebotomy is the planned removal of blood from a person's body for the purpose of treating a medical condition. The procedure is similar to donating blood. Usually, about a pint (470 mL, or 0.47 L) of blood is removed. The average adult has 9-12 pints (4.3-5.7 L) of blood in the body. Therapeutic phlebotomy may be used to treat the following medical conditions:  Hemochromatosis. This is a condition in which the blood contains too much iron.  Polycythemia vera. This is a condition in which the blood contains too many red blood cells.  Porphyria cutanea tarda. This is a disease in which an important part of hemoglobin is not made properly. It results in the buildup of abnormal amounts of porphyrins in the body.  Sickle cell disease. This is a condition in which the red blood cells form an abnormal crescent shape rather than a round shape. Tell a health care provider about:  Any allergies you have.  All medicines you are taking, including vitamins, herbs, eye drops, creams, and over-the-counter medicines.  Any problems you or family members have had with anesthetic medicines.  Any blood disorders you have.  Any surgeries you have had.  Any medical conditions you have.  Whether you are pregnant or may be pregnant. What are the risks? Generally, this is a safe procedure. However, problems may occur, including:  Nausea or light-headedness.  Low blood pressure (hypotension).  Soreness, bleeding, swelling, or bruising at the needle insertion site.  Infection. What happens before the procedure?  Follow instructions from your health care provider about eating or drinking restrictions.  Ask your health care provider about: ? Changing or stopping your regular medicines. This is especially important if you are taking diabetes medicines or blood thinners (anticoagulants). ? Taking medicines such as aspirin and ibuprofen. These medicines can thin your  blood. Do not take these medicines unless your health care provider tells you to take them. ? Taking over-the-counter medicines, vitamins, herbs, and supplements.  Wear clothing with sleeves that can be raised above the elbow.  Plan to have someone take you home from the hospital or clinic.  You may have a blood sample taken.  Your blood pressure, pulse rate, and breathing rate will be measured. What happens during the procedure?   To lower your risk of infection: ? Your health care team will wash or sanitize their hands. ? Your skin will be cleaned with an antiseptic.  You may be given a medicine to numb the area (local anesthetic).  A tourniquet will be placed on your arm.  A needle will be inserted into one of your veins.  Tubing and a collection bag will be attached to that needle.  Blood will flow through the needle and tubing into the collection bag.  The collection bag will be placed lower than your arm to allow gravity to help the flow of blood into the bag.  You may be asked to open and close your hand slowly and continually during the entire collection.  After the specified amount of blood has been removed from your body, the collection bag and tubing will be clamped.  The needle will be removed from your vein.  Pressure will be held on the site of the needle insertion to stop the bleeding.  A bandage (dressing) will be placed over the needle insertion site. The procedure may vary among health care providers and hospitals. What happens after the procedure?  Your blood pressure, pulse rate, and breathing rate will be   measured after the procedure.  You will be encouraged to drink fluids.  Your recovery will be assessed and monitored.  You can return to your normal activities as told by your health care provider. Summary  Therapeutic phlebotomy is the planned removal of blood from a person's body for the purpose of treating a medical condition.  Therapeutic  phlebotomy may be used to treat hemochromatosis, polycythemia vera, porphyria cutanea tarda, or sickle cell disease.  In the procedure, a needle is inserted and about a pint (470 mL, or 0.47 L) of blood is removed. The average adult has 9-12 pints (4.3-5.7 L) of blood in the body.  This is generally a safe procedure, but it can sometimes cause problems such as nausea, light-headedness, or low blood pressure (hypotension). This information is not intended to replace advice given to you by your health care provider. Make sure you discuss any questions you have with your health care provider. Document Revised: 07/18/2017 Document Reviewed: 07/18/2017 Elsevier Patient Education  2020 [employer] Inc[employer]  

## 2019-09-11 NOTE — Progress Notes (Signed)
Hematology and Oncology Follow Up Visit  Robert Tran [id number] 1940/11/20 79 y.o. 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  He is having a few problems.  The main problem is that he needs to be phlebotomized.  His other big problem is that his blood sugars are quite high.  He is on Metformin.  I am sure that his family doctor is going to be aggressive with his blood sugar control.  He has a lot of redness in his feet.  I will know if this might be from neuropathy or this might be from his polycythemia.  He has had no fever.  He has had no cough.  He has had no nausea or vomiting.  He still does drink about 4 to 5 cups of coffee a day.  He still smokes.  He has had no headache.    Overall, his performance status is ECOG 1.    Medications:  Allergies as of 09/11/2019      Reactions   Pravastatin Sodium Other (See Comments)   Patient stated,"it messed up my back and lege, I couldn't walk."   Minocycline Nausea Only   Sulfa Antibiotics Nausea Only   Sulfa Drugs Cross Reactors Nausea Only      Medication List       Accurate as of September 11, 2019 11:21 AM. If you have any questions, ask your nurse or doctor.        albuterol 108 (90 Base) MCG/ACT inhaler Commonly known as: VENTOLIN HFA Inhale into the lungs.   amLODipine 10 MG tablet Commonly known as: NORVASC Take 10 mg by mouth daily.   fluocinonide 0.05 % external solution Commonly known as: LIDEX Apply 1 application topically daily.   folic acid 425 MCG tablet Commonly known as: FOLVITE Take 400 mcg by mouth every morning.   meclizine 25 MG tablet Commonly known as: ANTIVERT Take 1 tablet (25 mg total) by mouth 3 (three) times daily as needed.   metFORMIN 500 MG 24 hr tablet Commonly known as: GLUCOPHAGE-XR Take by mouth.     Plavix 75 MG tablet Generic drug: clopidogrel Take 75 mg by mouth daily.   ramipril 10 MG tablet Commonly known as: ALTACE Take 10 mg by mouth daily.   torsemide 10 MG tablet Commonly known as: DEMADEX Take by mouth daily as needed.   Vitamin D 125 MCG (5000 UT) Caps Take 5,000 Units by mouth daily. 03/08/15 taking 5000 u       Allergies:  Allergies  Allergen Reactions  . Pravastatin Sodium Other (See Comments)    Patient stated,"it messed up my back and lege, I couldn't walk."  . Minocycline Nausea Only  . Sulfa Antibiotics Nausea Only  . Sulfa Drugs Cross Reactors Nausea Only    Past Medical History, Surgical history, Social history, and Family History were reviewed and updated.  Review of Systems: Review of Systems  Constitutional: Negative.   HENT: Negative.   Eyes: Negative.   Respiratory: Negative.   Cardiovascular: Negative.   Gastrointestinal: Negative.   Genitourinary: Negative.   Musculoskeletal: Negative.   Skin: Negative.   Neurological: Negative.   Endo/Heme/Allergies: Negative.   Psychiatric/Behavioral: Negative.     Physical Exam:  weight is 170 lb (77.1 kg). Her temporal temperature is 96.9 F (36.1 C) (abnormal). Her blood pressure  is 112/77 and her pulse is 97. Her respiration is 18 and oxygen saturation is 97%.   Wt Readings from Last 3 Encounters:  09/11/19 170 lb (77.1 kg)  07/30/19 171 lb (77.6 kg)  04/29/19 170 lb 6.4 oz (77.3 kg)    Physical Exam Vitals reviewed.  HENT:     Head: Normocephalic and atraumatic.  Eyes:     Pupils: Pupils are equal, round, and reactive to light.  Cardiovascular:     Rate and Rhythm: Normal rate and regular rhythm.     Heart sounds: Normal heart sounds.  Pulmonary:     Effort: Pulmonary effort is normal.     Breath sounds: Normal breath sounds.  Abdominal:     General: Bowel sounds are normal.     Palpations: Abdomen is soft.  Musculoskeletal:        General: No tenderness or deformity.  Normal range of motion.     Cervical back: Normal range of motion.  Lymphadenopathy:     Cervical: No cervical adenopathy.  Skin:    General: Skin is warm and dry.     Findings: No erythema or rash.  Neurological:     Mental Status: She is alert and oriented to person, place, and time.  Psychiatric:        Behavior: Behavior normal.        Thought Content: Thought content normal.        Judgment: Judgment normal.      Lab Results  Component Value Date   WBC 9.4 09/11/2019   HGB 16.9 09/11/2019   HCT 51.0 09/11/2019   MCV 92.7 09/11/2019   PLT 316 09/11/2019   Lab Results  Component Value Date   FERRITIN 16 (L) 07/30/2019   IRON 84 07/30/2019   TIBC 330 07/30/2019   UIBC 246 07/30/2019   IRONPCTSAT 26 07/30/2019   Lab Results  Component Value Date   RETICCTPCT 1.4 06/20/2015   RBC 5.50 09/11/2019   RETICCTABS 77.3 06/20/2015   No results found for: KPAFRELGTCHN, LAMBDASER, KAPLAMBRATIO No results found for: IGGSERUM, IGA, IGMSERUM No results found for: [name], SPEI   Chemistry      Component Value Date/Time   NA 138 09/11/2019 1029   NA 142 07/18/2017 1421   NA 134 (L) 08/09/2016 1313   K 4.7 09/11/2019 1029   K 4.0 07/18/2017 1421   K 4.2 08/09/2016 1313   CL 103 09/11/2019 1029   CL 103 07/18/2017 1421   CO2 28 09/11/2019 1029   CO2 29 07/18/2017 1421   CO2 20 (L) 08/09/2016 1313   BUN 34 (H) 09/11/2019 1029   BUN 19 07/18/2017 1421   BUN 19.9 08/09/2016 1313   CREATININE 1.49 (H) 09/11/2019 1029   CREATININE 1.4 (H) 07/18/2017 1421   CREATININE 1.3 08/09/2016 1313      Component Value Date/Time   CALCIUM 11.0 (H) 09/11/2019 1029   CALCIUM 10.2 07/18/2017 1421   CALCIUM 10.0 08/09/2016 1313   ALKPHOS 64 09/11/2019 1029   ALKPHOS 65 07/18/2017 1421   ALKPHOS 75 08/09/2016 1313   AST 11 (L) 09/11/2019 1029   AST 14 08/09/2016 1313   ALT 15 09/11/2019 1029   ALT 21 07/18/2017 1421   ALT  23 08/09/2016 1313   BILITOT 0.4 09/11/2019 1029   BILITOT 0.34 08/09/2016 1313      Impression and Plan: Mr. Robert Tran is a 79 year old male. He has polycythemia. 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  However, phlebotomizing him definitely makes him feel better.  We are going to have to phlebotomize him today.  He understands the reason why.  I think we are going to have to be more aggressive with his phlebotomies.  I think that if his blood sugars stay as high as they are, I think that he will be at high risk for neuropathy if his hemoglobin is on the high side.  I does worry about him not getting good blood flow to his extremities.  I would like to see him back in 6 weeks.  I know this is going to be early for him but again I think we have to be more aggressive with intervening and preventing permanent problems.     Volanda Napoleon, MD 2/26/202111:21 AM

## 2019-09-11 NOTE — Progress Notes (Signed)
Robert Tran presents today for phlebotomy per MD orders. Phlebotomy procedure started at 1140 and ended at 1145. 554 grams removed via 16 gauge needle to right AC. Patient observed for 30 minutes after procedure without any incident. Patient tolerated procedure well. IV needle removed intact.

## 2019-09-14 LAB — IRON AND TIBC
Iron: 75 ug/dL (ref 42–163)
Saturation Ratios: 22 % (ref 20–55)
TIBC: 349 ug/dL (ref 202–409)
UIBC: 274 ug/dL (ref 117–376)

## 2019-09-14 LAB — FERRITIN: Ferritin: 28 ng/mL (ref 24–336)

## 2019-09-15 DIAGNOSIS — Z91148 Patient's other noncompliance with medication regimen for other reason: Secondary | ICD-10-CM | POA: Insufficient documentation

## 2019-09-15 DIAGNOSIS — I10 Essential (primary) hypertension: Secondary | ICD-10-CM | POA: Insufficient documentation

## 2019-10-14 ENCOUNTER — Ambulatory Visit: Payer: Medicare Other | Admitting: Hematology & Oncology

## 2019-10-14 ENCOUNTER — Other Ambulatory Visit: Payer: Medicare Other

## 2019-10-20 ENCOUNTER — Ambulatory Visit: Payer: Medicare Other | Admitting: Hematology & Oncology

## 2019-10-20 ENCOUNTER — Other Ambulatory Visit: Payer: Medicare Other

## 2019-10-23 ENCOUNTER — Inpatient Hospital Stay (HOSPITAL_BASED_OUTPATIENT_CLINIC_OR_DEPARTMENT_OTHER): Payer: Medicare Other | Admitting: Family

## 2019-10-23 ENCOUNTER — Encounter: Payer: Self-pay | Admitting: Family

## 2019-10-23 ENCOUNTER — Inpatient Hospital Stay: Payer: Medicare Other

## 2019-10-23 ENCOUNTER — Other Ambulatory Visit: Payer: Self-pay

## 2019-10-23 ENCOUNTER — Inpatient Hospital Stay: Payer: Medicare Other | Attending: Hematology & Oncology

## 2019-10-23 VITALS — BP 140/70 | HR 85 | Temp 97.6°F | Resp 18 | Ht 65.0 in | Wt 170.0 lb

## 2019-10-23 DIAGNOSIS — F17210 Nicotine dependence, cigarettes, uncomplicated: Secondary | ICD-10-CM | POA: Diagnosis not present

## 2019-10-23 DIAGNOSIS — J449 Chronic obstructive pulmonary disease, unspecified: Secondary | ICD-10-CM | POA: Insufficient documentation

## 2019-10-23 DIAGNOSIS — D45 Polycythemia vera: Secondary | ICD-10-CM | POA: Insufficient documentation

## 2019-10-23 DIAGNOSIS — G629 Polyneuropathy, unspecified: Secondary | ICD-10-CM | POA: Diagnosis not present

## 2019-10-23 DIAGNOSIS — D500 Iron deficiency anemia secondary to blood loss (chronic): Secondary | ICD-10-CM

## 2019-10-23 LAB — CBC WITH DIFFERENTIAL (CANCER CENTER ONLY)
Abs Immature Granulocytes: 0.12 10*3/uL — ABNORMAL HIGH (ref 0.00–0.07)
Basophils Absolute: 0.1 10*3/uL (ref 0.0–0.1)
Basophils Relative: 1 %
Eosinophils Absolute: 0.2 10*3/uL (ref 0.0–0.5)
Eosinophils Relative: 3 %
HCT: 46.3 % (ref 39.0–52.0)
Hemoglobin: 15 g/dL (ref 13.0–17.0)
Immature Granulocytes: 2 %
Lymphocytes Relative: 29 %
Lymphs Abs: 2.3 10*3/uL (ref 0.7–4.0)
MCH: 30 pg (ref 26.0–34.0)
MCHC: 32.4 g/dL (ref 30.0–36.0)
MCV: 92.6 fL (ref 80.0–100.0)
Monocytes Absolute: 0.6 10*3/uL (ref 0.1–1.0)
Monocytes Relative: 8 %
Neutro Abs: 4.8 10*3/uL (ref 1.7–7.7)
Neutrophils Relative %: 57 %
Platelet Count: 303 10*3/uL (ref 150–400)
RBC: 5 MIL/uL (ref 4.22–5.81)
RDW: 13.4 % (ref 11.5–15.5)
WBC Count: 8.2 10*3/uL (ref 4.0–10.5)
nRBC: 0 % (ref 0.0–0.2)

## 2019-10-23 LAB — RETICULOCYTES
Immature Retic Fract: 16.5 % — ABNORMAL HIGH (ref 2.3–15.9)
RBC.: 4.99 MIL/uL (ref 4.22–5.81)
Retic Count, Absolute: 100.8 10*3/uL (ref 19.0–186.0)
Retic Ct Pct: 2 % (ref 0.4–3.1)

## 2019-10-23 LAB — CMP (CANCER CENTER ONLY)
ALT: 11 U/L (ref 0–44)
AST: 9 U/L — ABNORMAL LOW (ref 15–41)
Albumin: 4.1 g/dL (ref 3.5–5.0)
Alkaline Phosphatase: 52 U/L (ref 38–126)
Anion gap: 7 (ref 5–15)
BUN: 24 mg/dL — ABNORMAL HIGH (ref 8–23)
CO2: 21 mmol/L — ABNORMAL LOW (ref 22–32)
Calcium: 9.9 mg/dL (ref 8.9–10.3)
Chloride: 109 mmol/L (ref 98–111)
Creatinine: 1.2 mg/dL (ref 0.61–1.24)
GFR, Est AFR Am: >60 mL/min (ref >60)
GFR, Estimated: 58 mL/min — ABNORMAL LOW (ref >60)
Glucose, Bld: 144 mg/dL — ABNORMAL HIGH (ref 70–99)
Potassium: 4 mmol/L (ref 3.5–5.1)
Sodium: 137 mmol/L (ref 135–145)
Total Bilirubin: 0.4 mg/dL (ref 0.3–1.2)
Total Protein: 6.2 g/dL — ABNORMAL LOW (ref 6.5–8.1)

## 2019-10-23 NOTE — Progress Notes (Signed)
Robert Tran presents today for phlebotomy per MD orders. Phlebotomy procedure started at 1300 and ended at 1306  With  550 grams removed via 16 G to [place].  Patient observed for 10 minutes after procedure without any incident. Patient tolerated procedure well. Diet and nutrition offered.  IV needle removed intact.

## 2019-10-23 NOTE — Progress Notes (Signed)
Hematology and Oncology Follow Up Visit  Robert Tran [id number] [birth year] 79 y.o. 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 Robert Tran has noted his feet and face being more red lately.  Robert Tran was worked up earlier this week by cardiology [facility]) with stress test and ECHO. Stress test result is not available yet but ECHO showed an EF of 55-60% and mild tricuspid regurgitation. Robert Tran has a follow-up appointment with them coming up to go over his results.  Robert Tran has some SOB with over exertion due to COPD. Robert Tran is still smoking 1 ppd.  No fever, chills, n/v, cough, rash, dizziness, SOB, chest pain, palpitations, abdominal pain or changes in bowel or bladder habits.  No episodes of bleeding. No bruising or petechiae.  No swelling or tenderness in his extremities.  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  No falls or syncopal episodes to report.  Robert Tran has maintained a good appetite and is staying well hydrated. His weight is stable.   ECOG Performance Status: 1 - Symptomatic but completely ambulatory  Medications:  Allergies as of 10/23/2019      Reactions   Pravastatin Sodium Other (See Comments)   Patient stated,"it messed up my back and lege, I couldn't walk."   Minocycline Nausea Only   Sulfa Antibiotics Nausea Only   Sulfa Drugs Cross Reactors Nausea Only      Medication List       Accurate as of October 23, 2019 12:37 PM. If you have any questions, ask your nurse or doctor.        albuterol 108 (90 Base) MCG/ACT inhaler Commonly known as: VENTOLIN HFA Inhale into the lungs.   amLODipine 10 MG tablet Commonly known as: NORVASC Take 10 mg by mouth daily.   fluocinonide 0.05 % external solution Commonly known as: LIDEX Apply 1 application topically  daily.   folic acid 185 MCG tablet Commonly known as: FOLVITE Take 400 mcg by mouth every morning.   meclizine 25 MG tablet Commonly known as: ANTIVERT Take 1 tablet (25 mg total) by mouth 3 (three) times daily as needed.   metFORMIN 500 MG 24 hr tablet Commonly known as: GLUCOPHAGE-XR Take by mouth.   Plavix 75 MG tablet Generic drug: clopidogrel Take 75 mg by mouth daily.   ramipril 10 MG tablet Commonly known as: ALTACE Take 10 mg by mouth daily.   torsemide 10 MG tablet Commonly known as: DEMADEX Take by mouth daily as needed.   Vitamin D 125 MCG (5000 UT) Caps Take 5,000 Units by mouth daily. 03/08/15 taking 5000 u       Allergies:  Allergies  Allergen Reactions  . Pravastatin Sodium Other (See Comments)    Patient stated,"it messed up my back and lege, I couldn't walk."  . Minocycline Nausea Only  . Sulfa Antibiotics Nausea Only  . Sulfa Drugs Cross Reactors Nausea Only    Past Medical History, Surgical history, Social history, and Family History were reviewed and updated.  Review of Systems: All other 10 point review of systems is negative.   Physical Exam:  vitals were not taken for this visit.   Wt Readings from Last 3 Encounters:  09/11/19 170 lb (77.1 kg)  07/30/19 171 lb (77.6 kg)  04/29/19 170 lb 6.4 oz (77.3 kg)    Ocular: Sclerae unicteric, pupils equal, round and reactive to light Ear-nose-throat: Oropharynx clear, dentition fair Lymphatic: No cervical or supraclavicular adenopathy Lungs no rales or rhonchi, good excursion bilaterally Heart regular rate and rhythm, no murmur appreciated Abd soft, nontender, positive bowel sounds, no liver or spleen tip palpated on exam, no fluid wave  MSK no focal spinal tenderness, no joint edema Neuro: non-focal, well-oriented, appropriate affect Breasts: Deferred   Lab Results  Component Value Date   WBC 8.2 10/23/2019   HGB 15.0 10/23/2019   HCT 46.3 10/23/2019   MCV 92.6 10/23/2019   PLT 303  10/23/2019   Lab Results  Component Value Date   FERRITIN 28 09/11/2019   IRON 75 09/11/2019   TIBC 349 09/11/2019   UIBC 274 09/11/2019   IRONPCTSAT 22 09/11/2019   Lab Results  Component Value Date   RETICCTPCT 2.0 10/23/2019   RBC 4.99 10/23/2019   RBC 5.00 10/23/2019   RETICCTABS 77.3 06/20/2015   No results found for: KPAFRELGTCHN, LAMBDASER, KAPLAMBRATIO No results found for: IGGSERUM, IGA, IGMSERUM No results found for: [name], SPEI   Chemistry      Component Value Date/Time   NA 137 10/23/2019 1152   NA 142 07/18/2017 1421   NA 134 (L) 08/09/2016 1313   K 4.0 10/23/2019 1152   K 4.0 07/18/2017 1421   K 4.2 08/09/2016 1313   CL 109 10/23/2019 1152   CL 103 07/18/2017 1421   CO2 21 (L) 10/23/2019 1152   CO2 29 07/18/2017 1421   CO2 20 (L) 08/09/2016 1313   BUN 24 (H) 10/23/2019 1152   BUN 19 07/18/2017 1421   BUN 19.9 08/09/2016 1313   CREATININE 1.20 10/23/2019 1152   CREATININE 1.4 (H) 07/18/2017 1421   CREATININE 1.3 08/09/2016 1313      Component Value Date/Time   CALCIUM 9.9 10/23/2019 1152   CALCIUM 10.2 07/18/2017 1421   CALCIUM 10.0 08/09/2016 1313   ALKPHOS 52 10/23/2019 1152   ALKPHOS 65 07/18/2017 1421   ALKPHOS 75 08/09/2016 1313   AST 9 (L) 10/23/2019 1152   AST 14 08/09/2016 1313   ALT 11 10/23/2019 1152   ALT 21 07/18/2017 1421   ALT 23 08/09/2016 1313   BILITOT 0.4 10/23/2019 1152   BILITOT 0.34 08/09/2016 1313       Impression and Plan: Robert Tran is a very pleasant 79 yo caucasian gentleman with secondary polycythemia (smoking).  We will proceed with phlebotomy today for Hct 46.3%.  We will plan to see him back in another 6 weeks.  Robert Tran will contact our office with any questions or concerns. We can certainly see him sooner if needed.   Laverna Peace, NP 4/9/202112:37 PM

## 2019-10-26 LAB — IRON AND TIBC
Iron: 80 ug/dL (ref 42–163)
Saturation Ratios: 22 % (ref 20–55)
TIBC: 364 ug/dL (ref 202–409)
UIBC: 284 ug/dL (ref 117–376)

## 2019-10-26 LAB — FERRITIN: Ferritin: 8 ng/mL — ABNORMAL LOW (ref 24–336)

## 2019-11-12 DIAGNOSIS — R9439 Abnormal result of other cardiovascular function study: Secondary | ICD-10-CM | POA: Insufficient documentation

## 2019-12-07 ENCOUNTER — Inpatient Hospital Stay: Payer: Medicare Other | Attending: Hematology & Oncology

## 2019-12-07 ENCOUNTER — Inpatient Hospital Stay: Payer: Medicare Other

## 2019-12-07 ENCOUNTER — Inpatient Hospital Stay (HOSPITAL_BASED_OUTPATIENT_CLINIC_OR_DEPARTMENT_OTHER): Payer: Medicare Other | Admitting: Hematology & Oncology

## 2019-12-07 ENCOUNTER — Telehealth: Payer: Self-pay | Admitting: Hematology & Oncology

## 2019-12-07 ENCOUNTER — Encounter: Payer: Self-pay | Admitting: Hematology & Oncology

## 2019-12-07 ENCOUNTER — Other Ambulatory Visit: Payer: Self-pay

## 2019-12-07 VITALS — BP 133/71 | HR 79 | Temp 98.1°F | Wt 167.0 lb

## 2019-12-07 DIAGNOSIS — D45 Polycythemia vera: Secondary | ICD-10-CM

## 2019-12-07 DIAGNOSIS — D500 Iron deficiency anemia secondary to blood loss (chronic): Secondary | ICD-10-CM

## 2019-12-07 LAB — CBC WITH DIFFERENTIAL (CANCER CENTER ONLY)
Abs Immature Granulocytes: 0.12 10*3/uL — ABNORMAL HIGH (ref 0.00–0.07)
Basophils Absolute: 0.1 10*3/uL (ref 0.0–0.1)
Basophils Relative: 1 %
Eosinophils Absolute: 0.2 10*3/uL (ref 0.0–0.5)
Eosinophils Relative: 3 %
HCT: 46.7 % (ref 39.0–52.0)
Hemoglobin: 14.9 g/dL (ref 13.0–17.0)
Immature Granulocytes: 1 %
Lymphocytes Relative: 27 %
Lymphs Abs: 2.3 10*3/uL (ref 0.7–4.0)
MCH: 29.4 pg (ref 26.0–34.0)
MCHC: 31.9 g/dL (ref 30.0–36.0)
MCV: 92.3 fL (ref 80.0–100.0)
Monocytes Absolute: 0.7 10*3/uL (ref 0.1–1.0)
Monocytes Relative: 9 %
Neutro Abs: 5 10*3/uL (ref 1.7–7.7)
Neutrophils Relative %: 59 %
Platelet Count: 279 10*3/uL (ref 150–400)
RBC: 5.06 MIL/uL (ref 4.22–5.81)
RDW: 13.1 % (ref 11.5–15.5)
WBC Count: 8.5 10*3/uL (ref 4.0–10.5)
nRBC: 0 % (ref 0.0–0.2)

## 2019-12-07 LAB — CMP (CANCER CENTER ONLY)
ALT: 11 U/L (ref 0–44)
AST: 9 U/L — ABNORMAL LOW (ref 15–41)
Albumin: 4.2 g/dL (ref 3.5–5.0)
Alkaline Phosphatase: 56 U/L (ref 38–126)
Anion gap: 6 (ref 5–15)
BUN: 26 mg/dL — ABNORMAL HIGH (ref 8–23)
CO2: 25 mmol/L (ref 22–32)
Calcium: 10.5 mg/dL — ABNORMAL HIGH (ref 8.9–10.3)
Chloride: 107 mmol/L (ref 98–111)
Creatinine: 1.26 mg/dL — ABNORMAL HIGH (ref 0.61–1.24)
GFR, Est AFR Am: >60 mL/min (ref >60)
GFR, Estimated: 54 mL/min — ABNORMAL LOW (ref >60)
Glucose, Bld: 168 mg/dL — ABNORMAL HIGH (ref 70–99)
Potassium: 4 mmol/L (ref 3.5–5.1)
Sodium: 138 mmol/L (ref 135–145)
Total Bilirubin: 0.4 mg/dL (ref 0.3–1.2)
Total Protein: 6.7 g/dL (ref 6.5–8.1)

## 2019-12-07 LAB — RETICULOCYTES
Immature Retic Fract: 21 % — ABNORMAL HIGH (ref 2.3–15.9)
RBC.: 5.04 MIL/uL (ref 4.22–5.81)
Retic Count, Absolute: 99.3 10*3/uL (ref 19.0–186.0)
Retic Ct Pct: 2 % (ref 0.4–3.1)

## 2019-12-07 NOTE — Telephone Encounter (Signed)
Appointments scheduled calendar printed 5/24 los

## 2019-12-07 NOTE — Progress Notes (Signed)
Hematology and Oncology Follow Up Visit  Robert Tran [id number] 05/14/1941 79 y.o. 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  It sounds like Robert Tran had a very thorough work-up done at [facility].  Robert Tran said Robert Tran complained of his feet turning red.  Robert Tran was seen by cardiologist.  I think Robert Tran may be seen by vascular doctor.  Robert Tran actually underwent a cardiac cath.  From what Robert Tran tells me, there was no blockages.  His wife gave him some apple cider vinegar and this took care of his problem.  Robert Tran now is on Lipitor and metoprolol and also a baby aspirin.  Robert Tran has had no problems with cough.  Robert Tran is still smoking.  Robert Tran is trying to cut back on coffee.  There is been no change in bowel or bladder habits.  Overall, his performance status is ECOG 1.    Medications:  Allergies as of 12/07/2019      Reactions   Pravastatin Sodium Other (See Comments)   Patient stated,"it messed up my back and lege, I couldn't walk."   Minocycline Nausea Only   Sulfa Antibiotics Nausea Only   Sulfa Drugs Cross Reactors Nausea Only      Medication List       Accurate as of Dec 07, 2019  1:25 PM. If you have any questions, ask your nurse or doctor.        albuterol 108 (90 Base) MCG/ACT inhaler Commonly known as: VENTOLIN HFA Inhale into the lungs.   amLODipine 10 MG tablet Commonly known as: NORVASC Take 10 mg by mouth daily.   aspirin EC 81 MG tablet Take 81 mg by mouth daily.   atorvastatin 80 MG tablet Commonly known as: LIPITOR Take 40 mg by mouth daily.   fluocinonide 0.05 % external solution Commonly known as: LIDEX Apply 1 application topically daily.   folic acid 081 MCG tablet Commonly known as: FOLVITE Take 400 mcg by mouth every morning.   meclizine 25 MG tablet Commonly known as: ANTIVERT Take 1 tablet  (25 mg total) by mouth 3 (three) times daily as needed.   metFORMIN 500 MG 24 hr tablet Commonly known as: GLUCOPHAGE-XR Take by mouth.   metoprolol succinate 25 MG 24 hr tablet Commonly known as: TOPROL-XL Take 25 mg by mouth daily.   Plavix 75 MG tablet Generic drug: clopidogrel Take 75 mg by mouth daily.   ramipril 10 MG tablet Commonly known as: ALTACE Take 10 mg by mouth daily.   torsemide 10 MG tablet Commonly known as: DEMADEX Take by mouth daily as needed.   Vitamin D 125 MCG (5000 UT) Caps Take 5,000 Units by mouth daily. 03/08/15 taking 5000 u       Allergies:  Allergies  Allergen Reactions  . Pravastatin Sodium Other (See Comments)    Patient stated,"it messed up my back and lege, I couldn't walk."  . Minocycline Nausea Only  . Sulfa Antibiotics Nausea Only  . Sulfa Drugs Cross Reactors Nausea Only    Past Medical History, Surgical history, Social history, and Family History were reviewed and updated.  Review of Systems: Review of Systems  Constitutional: Negative.   HENT: Negative.   Eyes: Negative.   Respiratory: Negative.   Cardiovascular: Negative.   Gastrointestinal: Negative.   Genitourinary: Negative.   Musculoskeletal: Negative.  Skin: Negative.   Neurological: Negative.   Endo/Heme/Allergies: Negative.   Psychiatric/Behavioral: Negative.     Physical Exam:  weight is 167 lb (75.8 kg). Her oral temperature is 98.1 F (36.7 C). Her blood pressure is 133/71 and her pulse is 79. Her oxygen saturation is 99%.   Wt Readings from Last 3 Encounters:  12/07/19 167 lb (75.8 kg)  10/23/19 170 lb (77.1 kg)  09/11/19 170 lb (77.1 kg)    Physical Exam Vitals reviewed.  HENT:     Head: Normocephalic and atraumatic.  Eyes:     Pupils: Pupils are equal, round, and reactive to light.  Cardiovascular:     Rate and Rhythm: Normal rate and regular rhythm.     Heart sounds: Normal heart sounds.  Pulmonary:     Effort: Pulmonary effort is  normal.     Breath sounds: Normal breath sounds.  Abdominal:     General: Bowel sounds are normal.     Palpations: Abdomen is soft.  Musculoskeletal:        General: No tenderness or deformity. Normal range of motion.     Cervical back: Normal range of motion.  Lymphadenopathy:     Cervical: No cervical adenopathy.  Skin:    General: Skin is warm and dry.     Findings: No erythema or rash.  Neurological:     Mental Status: She is alert and oriented to person, place, and time.  Psychiatric:        Behavior: Behavior normal.        Thought Content: Thought content normal.        Judgment: Judgment normal.      Lab Results  Component Value Date   WBC 8.5 12/07/2019   HGB 14.9 12/07/2019   HCT 46.7 12/07/2019   MCV 92.3 12/07/2019   PLT 279 12/07/2019   Lab Results  Component Value Date   FERRITIN 8 (L) 10/23/2019   IRON 80 10/23/2019   TIBC 364 10/23/2019   UIBC 284 10/23/2019   IRONPCTSAT 22 10/23/2019   Lab Results  Component Value Date   RETICCTPCT 2.0 12/07/2019   RBC 5.06 12/07/2019   RETICCTABS 77.3 06/20/2015   No results found for: KPAFRELGTCHN, LAMBDASER, KAPLAMBRATIO No results found for: IGGSERUM, IGA, IGMSERUM No results found for: [name], SPEI   Chemistry      Component Value Date/Time   NA 138 12/07/2019 1155   NA 142 07/18/2017 1421   NA 134 (L) 08/09/2016 1313   K 4.0 12/07/2019 1155   K 4.0 07/18/2017 1421   K 4.2 08/09/2016 1313   CL 107 12/07/2019 1155   CL 103 07/18/2017 1421   CO2 25 12/07/2019 1155   CO2 29 07/18/2017 1421   CO2 20 (L) 08/09/2016 1313   BUN 26 (H) 12/07/2019 1155   BUN 19 07/18/2017 1421   BUN 19.9 08/09/2016 1313   CREATININE 1.26 (H) 12/07/2019 1155   CREATININE 1.4 (H) 07/18/2017 1421   CREATININE 1.3 08/09/2016 1313      Component Value Date/Time   CALCIUM 10.5 (H) 12/07/2019 1155   CALCIUM 10.2 07/18/2017 1421   CALCIUM 10.0 08/09/2016 1313    ALKPHOS 56 12/07/2019 1155   ALKPHOS 65 07/18/2017 1421   ALKPHOS 75 08/09/2016 1313   AST 9 (L) 12/07/2019 1155   AST 14 08/09/2016 1313   ALT 11 12/07/2019 1155   ALT 21 07/18/2017 1421   ALT 23 08/09/2016 1313  BILITOT 0.4 12/07/2019 1155   BILITOT 0.34 08/09/2016 1313      Impression and Plan: Robert Tran is a 79 year old male. Robert Tran has polycythemia. 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 However, phlebotomizing him definitely makes him feel better.  I think we probably hold off on phlebotomize him today.  Hopefully, with him being on Lipitor, this may help with any cholesterol issues that Robert Tran may have.  I will plan to get him back in August.  We probably have to phlebotomize him at that point.       Volanda Napoleon, MD 5/24/20211:25 PM

## 2019-12-08 LAB — IRON AND TIBC
Iron: 68 ug/dL (ref 42–163)
Saturation Ratios: 17 % — ABNORMAL LOW (ref 20–55)
TIBC: 390 ug/dL (ref 202–409)
UIBC: 322 ug/dL (ref 117–376)

## 2019-12-08 LAB — FERRITIN: Ferritin: 12 ng/mL — ABNORMAL LOW (ref 24–336)

## 2020-01-11 DIAGNOSIS — I2510 Atherosclerotic heart disease of native coronary artery without angina pectoris: Secondary | ICD-10-CM | POA: Insufficient documentation

## 2020-01-12 ENCOUNTER — Telehealth: Payer: Self-pay

## 2020-01-12 NOTE — Telephone Encounter (Signed)
I have discussed with Dr. Nila Nephew (urology) He is having occ rectal bleeding and needs to be seen.  Plan: -Please work him into my clinic or APP clinic in 1-2 weeks.  Best number to reach pt is [phone]  RG

## 2020-01-13 NOTE — Telephone Encounter (Signed)
Spoke with patient. Appointment made for 01/27/20 at 8: 15.  Patient verbalized understanding.

## 2020-01-15 DIAGNOSIS — R918 Other nonspecific abnormal finding of lung field: Secondary | ICD-10-CM | POA: Insufficient documentation

## 2020-01-15 DIAGNOSIS — N2889 Other specified disorders of kidney and ureter: Secondary | ICD-10-CM | POA: Insufficient documentation

## 2020-01-20 ENCOUNTER — Other Ambulatory Visit: Payer: Self-pay

## 2020-01-21 ENCOUNTER — Inpatient Hospital Stay (HOSPITAL_BASED_OUTPATIENT_CLINIC_OR_DEPARTMENT_OTHER): Payer: Medicare Other | Admitting: Hematology & Oncology

## 2020-01-21 ENCOUNTER — Encounter: Payer: Self-pay | Admitting: Hematology & Oncology

## 2020-01-21 ENCOUNTER — Inpatient Hospital Stay: Payer: Medicare Other | Attending: Hematology & Oncology

## 2020-01-21 ENCOUNTER — Other Ambulatory Visit: Payer: Self-pay

## 2020-01-21 VITALS — BP 146/64 | HR 79 | Temp 97.7°F | Resp 20 | Wt 166.0 lb

## 2020-01-21 DIAGNOSIS — R918 Other nonspecific abnormal finding of lung field: Secondary | ICD-10-CM

## 2020-01-21 DIAGNOSIS — Z87891 Personal history of nicotine dependence: Secondary | ICD-10-CM | POA: Insufficient documentation

## 2020-01-21 DIAGNOSIS — Z882 Allergy status to sulfonamides status: Secondary | ICD-10-CM | POA: Diagnosis not present

## 2020-01-21 DIAGNOSIS — Z79899 Other long term (current) drug therapy: Secondary | ICD-10-CM | POA: Insufficient documentation

## 2020-01-21 DIAGNOSIS — K625 Hemorrhage of anus and rectum: Secondary | ICD-10-CM | POA: Diagnosis not present

## 2020-01-21 DIAGNOSIS — Z7902 Long term (current) use of antithrombotics/antiplatelets: Secondary | ICD-10-CM | POA: Diagnosis not present

## 2020-01-21 DIAGNOSIS — R911 Solitary pulmonary nodule: Secondary | ICD-10-CM | POA: Diagnosis not present

## 2020-01-21 DIAGNOSIS — D45 Polycythemia vera: Secondary | ICD-10-CM | POA: Diagnosis present

## 2020-01-21 LAB — CMP (CANCER CENTER ONLY)
ALT: 14 U/L (ref 0–44)
AST: 11 U/L — ABNORMAL LOW (ref 15–41)
Albumin: 4.4 g/dL (ref 3.5–5.0)
Alkaline Phosphatase: 55 U/L (ref 38–126)
Anion gap: 5 (ref 5–15)
BUN: 20 mg/dL (ref 8–23)
CO2: 24 mmol/L (ref 22–32)
Calcium: 10.7 mg/dL — ABNORMAL HIGH (ref 8.9–10.3)
Chloride: 108 mmol/L (ref 98–111)
Creatinine: 1.2 mg/dL (ref 0.61–1.24)
GFR, Est AFR Am: >60 mL/min (ref >60)
GFR, Estimated: 57 mL/min — ABNORMAL LOW (ref >60)
Glucose, Bld: 138 mg/dL — ABNORMAL HIGH (ref 70–99)
Potassium: 4.8 mmol/L (ref 3.5–5.1)
Sodium: 137 mmol/L (ref 135–145)
Total Bilirubin: 0.4 mg/dL (ref 0.3–1.2)
Total Protein: 6.8 g/dL (ref 6.5–8.1)

## 2020-01-21 LAB — CBC WITH DIFFERENTIAL (CANCER CENTER ONLY)
Abs Immature Granulocytes: 0.12 10*3/uL — ABNORMAL HIGH (ref 0.00–0.07)
Basophils Absolute: 0.1 10*3/uL (ref 0.0–0.1)
Basophils Relative: 1 %
Eosinophils Absolute: 0.5 10*3/uL (ref 0.0–0.5)
Eosinophils Relative: 4 %
HCT: 46.9 % (ref 39.0–52.0)
Hemoglobin: 15.1 g/dL (ref 13.0–17.0)
Immature Granulocytes: 1 %
Lymphocytes Relative: 24 %
Lymphs Abs: 2.7 10*3/uL (ref 0.7–4.0)
MCH: 29.3 pg (ref 26.0–34.0)
MCHC: 32.2 g/dL (ref 30.0–36.0)
MCV: 91.1 fL (ref 80.0–100.0)
Monocytes Absolute: 0.8 10*3/uL (ref 0.1–1.0)
Monocytes Relative: 7 %
Neutro Abs: 7.2 10*3/uL (ref 1.7–7.7)
Neutrophils Relative %: 63 %
Platelet Count: 295 10*3/uL (ref 150–400)
RBC: 5.15 MIL/uL (ref 4.22–5.81)
RDW: 14.2 % (ref 11.5–15.5)
WBC Count: 11.3 10*3/uL — ABNORMAL HIGH (ref 4.0–10.5)
nRBC: 0 % (ref 0.0–0.2)

## 2020-01-21 LAB — FERRITIN: Ferritin: 12 ng/mL — ABNORMAL LOW (ref 24–336)

## 2020-01-21 LAB — IRON AND TIBC
Iron: 46 ug/dL (ref 42–163)
Saturation Ratios: 12 % — ABNORMAL LOW (ref 20–55)
TIBC: 373 ug/dL (ref 202–409)
UIBC: 326 ug/dL (ref 117–376)

## 2020-01-21 NOTE — Progress Notes (Signed)
Hematology and Oncology Follow Up Visit  Parag Dorton [id number] 1941/04/12 79 y.o. 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 Keyworth is back for an early visit.  Unfortunately, I think we have a problem.  He had had a cardiac cath a couple months ago.  As part of a chest x-ray, there was an abnormality that was seen.  This was followed up with a CT scan.  CT scan was done last week.  Unfortunately, there is a 3.1 x 1.9 cm nodule in the right upper lobe.  In addition, there was a 2.1 x 1.6 cm nodule in the right upper lobe.  Also noted is a 1.6 x 1 cm left upper lobe nodule.  He has a history of heavy tobacco use.  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  As such, I think he is at high risk for lung cancer.  He really has had no hemoptysis.  He has had no shortness of breath.  Is been no chest wall pain.  He recently had some bright red blood per rectum.  He is having an evaluation for that.  His last colonoscopy was a couple years ago.  There is no weight loss.  He has had no fever.    Overall, his performance status is ECOG 1.    Medications:  Allergies as of 01/21/2020      Reactions   Pravastatin Sodium Other (See Comments)   Patient stated,"it messed up my back and lege, I couldn't walk."   Minocycline Nausea Only   Sulfa Antibiotics Nausea Only   Sulfa Drugs Cross Reactors Nausea Only      Medication List       Accurate as of January 21, 2020  8:50 AM. If you have any questions, ask your nurse or doctor.        albuterol 108 (90 Base) MCG/ACT inhaler Commonly known as: VENTOLIN HFA Inhale into the lungs.   amLODipine 10 MG tablet Commonly known as: NORVASC Take 10 mg by mouth daily.   aspirin EC 81 MG tablet Take 81 mg by mouth daily.   atorvastatin 80 MG tablet Commonly known as: LIPITOR Take 40 mg by mouth  daily.   fluocinonide 0.05 % external solution Commonly known as: LIDEX Apply 1 application topically daily.   folic acid 702 MCG tablet Commonly known as: FOLVITE Take 400 mcg by mouth every morning.   glimepiride 1 MG tablet Commonly known as: AMARYL Take 1 mg by mouth every morning.   meclizine 25 MG tablet Commonly known as: ANTIVERT Take 1 tablet (25 mg total) by mouth 3 (three) times daily as needed.   metFORMIN 500 MG 24 hr tablet Commonly known as: GLUCOPHAGE-XR Take by mouth in the morning and at bedtime.   metoprolol succinate 25 MG 24 hr tablet Commonly known as: TOPROL-XL Take 25 mg by mouth daily.   oxyCODONE 5 MG immediate release tablet Commonly known as: Oxy IR/ROXICODONE SMARTSIG:1 Tablet(s) By Mouth Every 8-12 Hours PRN   Plavix 75 MG tablet Generic drug: clopidogrel Take 75 mg by mouth daily.   ramipril 10 MG tablet Commonly known as: ALTACE Take 10 mg by mouth daily.   torsemide 10 MG tablet Commonly known as: DEMADEX Take by mouth daily as needed.   Vitamin D 125 MCG (5000 UT) Caps Take 5,000 Units  by mouth daily. 03/08/15 taking 5000 u       Allergies:  Allergies  Allergen Reactions   Pravastatin Sodium Other (See Comments)    Patient stated,"it messed up my back and lege, I couldn't walk."   Minocycline Nausea Only   Sulfa Antibiotics Nausea Only   Sulfa Drugs Cross Reactors Nausea Only    Past Medical History, Surgical history, Social history, and Family History were reviewed and updated.  Review of Systems: Review of Systems  Constitutional: Negative.   HENT: Negative.   Eyes: Negative.   Respiratory: Negative.   Cardiovascular: Negative.   Gastrointestinal: Negative.   Genitourinary: Negative.   Musculoskeletal: Negative.   Skin: Negative.   Neurological: Negative.   Endo/Heme/Allergies: Negative.   Psychiatric/Behavioral: Negative.     Physical Exam:  weight is 166 lb (75.3 kg). Her oral temperature is 97.7 F  (36.5 C). Her blood pressure is 146/64 (abnormal) and her pulse is 79. Her respiration is 20 and oxygen saturation is 98%.   Wt Readings from Last 3 Encounters:  01/21/20 166 lb (75.3 kg)  12/07/19 167 lb (75.8 kg)  10/23/19 170 lb (77.1 kg)    Physical Exam Vitals reviewed.  HENT:     Head: Normocephalic and atraumatic.  Eyes:     Pupils: Pupils are equal, round, and reactive to light.  Cardiovascular:     Rate and Rhythm: Normal rate and regular rhythm.     Heart sounds: Normal heart sounds.  Pulmonary:     Effort: Pulmonary effort is normal.     Breath sounds: Normal breath sounds.  Abdominal:     General: Bowel sounds are normal.     Palpations: Abdomen is soft.  Musculoskeletal:        General: No tenderness or deformity. Normal range of motion.     Cervical back: Normal range of motion.  Lymphadenopathy:     Cervical: No cervical adenopathy.  Skin:    General: Skin is warm and dry.     Findings: No erythema or rash.  Neurological:     Mental Status: She is alert and oriented to person, place, and time.  Psychiatric:        Behavior: Behavior normal.        Thought Content: Thought content normal.        Judgment: Judgment normal.      Lab Results  Component Value Date   WBC 11.3 (H) 01/21/2020   HGB 15.1 01/21/2020   HCT 46.9 01/21/2020   MCV 91.1 01/21/2020   PLT 295 01/21/2020   Lab Results  Component Value Date   FERRITIN 12 (L) 12/07/2019   IRON 68 12/07/2019   TIBC 390 12/07/2019   UIBC 322 12/07/2019   IRONPCTSAT 17 (L) 12/07/2019   Lab Results  Component Value Date   RETICCTPCT 2.0 12/07/2019   RBC 5.15 01/21/2020   RETICCTABS 77.3 06/20/2015   No results found for: KPAFRELGTCHN, LAMBDASER, KAPLAMBRATIO No results found for: IGGSERUM, IGA, IGMSERUM No results found for: [name], SPEI   Chemistry      Component Value Date/Time   NA 137 01/21/2020 0753   NA 142 07/18/2017 1421     NA 134 (L) 08/09/2016 1313   K 4.8 01/21/2020 0753   K 4.0 07/18/2017 1421   K 4.2 08/09/2016 1313   CL 108 01/21/2020 0753   CL 103 07/18/2017 1421   CO2 24 01/21/2020 0753   CO2 29 07/18/2017 1421  CO2 20 (L) 08/09/2016 1313   BUN 20 01/21/2020 0753   BUN 19 07/18/2017 1421   BUN 19.9 08/09/2016 1313   CREATININE 1.20 01/21/2020 0753   CREATININE 1.4 (H) 07/18/2017 1421   CREATININE 1.3 08/09/2016 1313      Component Value Date/Time   CALCIUM 10.7 (H) 01/21/2020 0753   CALCIUM 10.2 07/18/2017 1421   CALCIUM 10.0 08/09/2016 1313   ALKPHOS 55 01/21/2020 0753   ALKPHOS 65 07/18/2017 1421   ALKPHOS 75 08/09/2016 1313   AST 11 (L) 01/21/2020 0753   AST 14 08/09/2016 1313   ALT 14 01/21/2020 0753   ALT 21 07/18/2017 1421   ALT 23 08/09/2016 1313   BILITOT 0.4 01/21/2020 0753   BILITOT 0.34 08/09/2016 1313      Impression and Plan: Mr. Irby is a 80 year old male. He has polycythemia. 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 However, phlebotomizing him definitely makes him feel better.  I think we now are likely dealing with lung cancer.  Again he is at high risk for lung cancer given his history of tobacco use.  We will have to get a PET scan on him.  The PET scan will help [place] dictate what to biopsy.  Hopefully, we will be able to get a biopsy with material that we can send off for molecular markers.  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  It is possible that we could be looking at recurrent melanoma.  However, I think this would be unlikely.  This is quite complicated.  We are going to have to try to move quickly through this.  I very much appreciate his family doctor, Dr. Ardis Rowan, who got the CAT scan ordered.  We will certainly keep his family doctor informed as to what is going on.  I spent about 40 minutes with Mr. Zerby and his wife.  They have been with this for many years.  He served our country in [employer].  He definitely deserves  our best efforts.   Volanda Napoleon, MD 7/8/20218:50 AM

## 2020-01-22 ENCOUNTER — Telehealth: Payer: Self-pay | Admitting: Hematology & Oncology

## 2020-01-22 NOTE — Telephone Encounter (Signed)
No los 7/8

## 2020-01-27 ENCOUNTER — Ambulatory Visit: Payer: TRICARE For Life (TFL) | Admitting: Gastroenterology

## 2020-02-01 ENCOUNTER — Other Ambulatory Visit: Payer: Self-pay

## 2020-02-01 ENCOUNTER — Ambulatory Visit (HOSPITAL_COMMUNITY)
Admission: RE | Admit: 2020-02-01 | Discharge: 2020-02-01 | Disposition: A | Discharge status: Home / Self Care | Payer: Medicare Other | Source: Ambulatory Visit | Attending: Hematology & Oncology | Admitting: Hematology & Oncology

## 2020-02-01 DIAGNOSIS — I714 Abdominal aortic aneurysm, without rupture: Secondary | ICD-10-CM | POA: Diagnosis not present

## 2020-02-01 DIAGNOSIS — I700 Atherosclerosis of aorta: Secondary | ICD-10-CM | POA: Insufficient documentation

## 2020-02-01 DIAGNOSIS — N200 Calculus of kidney: Secondary | ICD-10-CM | POA: Insufficient documentation

## 2020-02-01 DIAGNOSIS — R918 Other nonspecific abnormal finding of lung field: Secondary | ICD-10-CM | POA: Insufficient documentation

## 2020-02-01 LAB — GLUCOSE, CAPILLARY: Glucose-Capillary: 132 mg/dL — ABNORMAL HIGH (ref 70–99)

## 2020-02-01 MED ORDER — FLUDEOXYGLUCOSE F - 18 (FDG) INJECTION
8.3100 | Freq: Once | INTRAVENOUS | Status: AC
  Administered 2020-02-01: 8.31 via INTRAVENOUS

## 2020-02-04 ENCOUNTER — Inpatient Hospital Stay (HOSPITAL_BASED_OUTPATIENT_CLINIC_OR_DEPARTMENT_OTHER): Payer: Medicare Other | Admitting: Hematology & Oncology

## 2020-02-04 DIAGNOSIS — R918 Other nonspecific abnormal finding of lung field: Secondary | ICD-10-CM | POA: Diagnosis not present

## 2020-02-04 DIAGNOSIS — D45 Polycythemia vera: Secondary | ICD-10-CM | POA: Diagnosis not present

## 2020-02-04 NOTE — Progress Notes (Signed)
Hematology and Oncology Follow Up Visit  Robert Tran [id number] Sep 30, 1940 79 y.o. 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  I really do believe that he does have lung cancer.  We did do a PET scan on him.  This was done on 02/01/2020.  This showed probably active nodules in the right upper lung.  There was 1 nodule measuring 1.7 cm with an SUV of 12.3.  A second lesion in the right upper lung measures 2.6 cm with an SUV of 13.4.  There is a third nodule in the lingula measuring 1.4 cm with a very slight SUV of 3.05.  There is no adenopathy.  There is no metabolic disease in the liver or in the bones.  Given his tobacco history, I have to believe that these are going to end up being bronchogenic carcinoma.  We must get a biopsy.  I would like to think that the posterior right upper lobe lesion can be biopsied.  This is close to the rib cage.  It is in between ribs that I think a biopsy could be done.  There is no problems with hemoptysis.  He has had no increased cough.  He is trying to cut back on tobacco use.  He has not lost weight.  He has had no fever.  There is no headache.  Once we confirm lung cancer, I think the real issue is whether or not this can be resected.  Both nodules were in 1 lobe.  The question is whether or not there is actual tumor in the left lung.  I think it would be unusual given the fact that there is no metabolic adenopathy in the mediastinum or hilum.  I cannot imagine that this is metastatic disease.  Nothing shows up anywhere else on the CT scans or PET scan that he had.  Currently, his performance status is ECOG 1.   Medications:  Allergies as of 02/04/2020      Reactions   Pravastatin Sodium Other (See  Comments)   Patient stated,"it messed up my back and lege, I couldn't walk."   Minocycline Nausea Only   Sulfa Antibiotics Nausea Only   Sulfa Drugs Cross Reactors Nausea Only      Medication List       Accurate as of February 04, 2020  5:23 PM. If you have any questions, ask your nurse or doctor.        albuterol 108 (90 Base) MCG/ACT inhaler Commonly known as: VENTOLIN HFA Inhale into the lungs.   amLODipine 10 MG tablet Commonly known as: NORVASC Take 10 mg by mouth daily.   aspirin EC 81 MG tablet Take 81 mg by mouth daily.   atorvastatin 80 MG tablet Commonly known as: LIPITOR Take 40 mg by mouth daily.   fluocinonide 0.05 % external solution Commonly known as: LIDEX Apply 1 application topically daily.   folic acid 938 MCG tablet Commonly known as: FOLVITE Take 400 mcg by mouth every morning.   glimepiride 1 MG tablet Commonly known as: AMARYL Take 1 mg by mouth every morning.   meclizine 25 MG tablet Commonly known as: ANTIVERT Take 1 tablet (25 mg total) by mouth 3 (three) times daily as needed.   metFORMIN 500 MG  24 hr tablet Commonly known as: GLUCOPHAGE-XR Take by mouth in the morning and at bedtime.   metoprolol succinate 25 MG 24 hr tablet Commonly known as: TOPROL-XL Take 25 mg by mouth daily.   oxyCODONE 5 MG immediate release tablet Commonly known as: Oxy IR/ROXICODONE SMARTSIG:1 Tablet(s) By Mouth Every 8-12 Hours PRN   Plavix 75 MG tablet Generic drug: clopidogrel Take 75 mg by mouth daily.   ramipril 10 MG tablet Commonly known as: ALTACE Take 10 mg by mouth daily.   torsemide 10 MG tablet Commonly known as: DEMADEX Take by mouth daily as needed.   Vitamin D 125 MCG (5000 UT) Caps Take 5,000 Units by mouth daily. 03/08/15 taking 5000 u       Allergies:  Allergies  Allergen Reactions  . Pravastatin Sodium Other (See Comments)    Patient stated,"it messed up my back and lege, I couldn't walk."  . Minocycline Nausea Only  .  Sulfa Antibiotics Nausea Only  . Sulfa Drugs Cross Reactors Nausea Only    Past Medical History, Surgical history, Social history, and Family History were reviewed and updated.  Review of Systems: Review of Systems  Constitutional: Negative.   HENT: Negative.   Eyes: Negative.   Respiratory: Negative.   Cardiovascular: Negative.   Gastrointestinal: Negative.   Genitourinary: Negative.   Musculoskeletal: Negative.   Skin: Negative.   Neurological: Negative.   Endo/Heme/Allergies: Negative.   Psychiatric/Behavioral: Negative.     Physical Exam:  vitals were not taken for this visit.   Wt Readings from Last 3 Encounters:  01/21/20 166 lb (75.3 kg)  12/07/19 167 lb (75.8 kg)  10/23/19 170 lb (77.1 kg)    Physical Exam Vitals reviewed.  HENT:     Head: Normocephalic and atraumatic.  Eyes:     Pupils: Pupils are equal, round, and reactive to light.  Cardiovascular:     Rate and Rhythm: Normal rate and regular rhythm.     Heart sounds: Normal heart sounds.  Pulmonary:     Effort: Pulmonary effort is normal.     Breath sounds: Normal breath sounds.  Abdominal:     General: Bowel sounds are normal.     Palpations: Abdomen is soft.  Musculoskeletal:        General: No tenderness or deformity. Normal range of motion.     Cervical back: Normal range of motion.  Lymphadenopathy:     Cervical: No cervical adenopathy.  Skin:    General: Skin is warm and dry.     Findings: No erythema or rash.  Neurological:     Mental Status: He is alert and oriented to person, place, and time.  Psychiatric:        Behavior: Behavior normal.        Thought Content: Thought content normal.        Judgment: Judgment normal.      Lab Results  Component Value Date   WBC 11.3 (H) 01/21/2020   HGB 15.1 01/21/2020   HCT 46.9 01/21/2020   MCV 91.1 01/21/2020   PLT 295 01/21/2020   Lab Results  Component Value Date   FERRITIN 12 (L) 01/21/2020   IRON 46 01/21/2020   TIBC 373  01/21/2020   UIBC 326 01/21/2020   IRONPCTSAT 12 (L) 01/21/2020   Lab Results  Component Value Date   RETICCTPCT 2.0 12/07/2019   RBC 5.15 01/21/2020   RETICCTABS 77.3 06/20/2015   No results found for: KPAFRELGTCHN, LAMBDASER, KAPLAMBRATIO No results found for: IGGSERUM,  IGA, IGMSERUM No results found for: Kathrynn Ducking, MSPIKE, SPEI   Chemistry      Component Value Date/Time   NA 137 01/21/2020 0753   NA 142 07/18/2017 1421   NA 134 (L) 08/09/2016 1313   K 4.8 01/21/2020 0753   K 4.0 07/18/2017 1421   K 4.2 08/09/2016 1313   CL 108 01/21/2020 0753   CL 103 07/18/2017 1421   CO2 24 01/21/2020 0753   CO2 29 07/18/2017 1421   CO2 20 (L) 08/09/2016 1313   BUN 20 01/21/2020 0753   BUN 19 07/18/2017 1421   BUN 19.9 08/09/2016 1313   CREATININE 1.20 01/21/2020 0753   CREATININE 1.4 (H) 07/18/2017 1421   CREATININE 1.3 08/09/2016 1313      Component Value Date/Time   CALCIUM 10.7 (H) 01/21/2020 0753   CALCIUM 10.2 07/18/2017 1421   CALCIUM 10.0 08/09/2016 1313   ALKPHOS 55 01/21/2020 0753   ALKPHOS 65 07/18/2017 1421   ALKPHOS 75 08/09/2016 1313   AST 11 (L) 01/21/2020 0753   AST 14 08/09/2016 1313   ALT 14 01/21/2020 0753   ALT 21 07/18/2017 1421   ALT 23 08/09/2016 1313   BILITOT 0.4 01/21/2020 0753   BILITOT 0.34 08/09/2016 1313      Impression and Plan: Robert Tran is a 79 year old male. He has polycythemia. 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 However, phlebotomizing him definitely makes him feel better.  I think we now are likely dealing with lung cancer.  Again he is at high risk for lung cancer given his history of tobacco use.  The neck step is a biopsy.  We will see if radiology can do a CT-guided biopsy of the posterior right lung mass.  I will have to speak with one of our thoracic surgeons.  I will have him take a look at the PET scan and see what he thinks about the possibility of resecting out the right  upper lung.  Again I know Mr. Morrical has a history of heavy tobacco use.  He is not on oxygen.  I think his lung function should not be all that bad.  Again, I think the real question is whether or not there is actual tumor in the left lung.  There is a very slight SUV increase.  I just  do not know if this is malignant.  Another possibility for treatment outside of systemic therapy might be radiosurgery.  If we can get a biopsy and check for molecular markers, this would be optimal.  I spent about 45 minutes with Mr. Radler.  I showed him the CT scan/PET scan.  I told him what I thought we should do.  He is in agreement.     Volanda Napoleon, MD 7/22/20215:23 PM

## 2020-02-05 ENCOUNTER — Telehealth: Payer: Self-pay | Admitting: Hematology & Oncology

## 2020-02-05 NOTE — Telephone Encounter (Signed)
No los 7/22 los

## 2020-02-08 ENCOUNTER — Encounter (HOSPITAL_COMMUNITY): Payer: Self-pay

## 2020-02-08 NOTE — Progress Notes (Signed)
Robert Tran "Robert Tran" Male, 79 y.o., Apr 18, 1941 MRN:  [id number] Phone:  [phone] (H) PCP:  Maris Berger, MD Primary Cvg:  [employer] Next Appt With Oncology 03/08/2020 at 11:45 AM  FW: CT Biopsy Received: 2 days ago Message Details  Arne Cleveland, MD  Volanda Napoleon, MD Cc: Jari Sportsman  These are high risk lesions for perc biopsy.  Has patient been assessed by pulmonary for bronch/ENB? Both lesions should be approachable.  If they fail or can't, we can proceed if needed.  Let me know.  Thanks  Arne Cleveland   Previous Messages  ----- Message -----  From: Lenore Cordia  Sent: 02/05/2020  3:52 PM EDT  To: Ir Procedure Requests  Subject: CT Biopsy                     Procedure Requested: CT Biopsy    Reason for Procedure: RUL mass -- next to ribs. big time smoker. need biopsies for dx and molecular studies    Provider Requesting: Dr Burney Gauze  Provider Telephone: [phone]    Other Info: Rad exam in Epic

## 2020-02-12 ENCOUNTER — Ambulatory Visit (INDEPENDENT_AMBULATORY_CARE_PROVIDER_SITE_OTHER): Payer: Medicare Other | Admitting: Emergency Medicine

## 2020-02-12 ENCOUNTER — Encounter: Payer: Self-pay | Admitting: Emergency Medicine

## 2020-02-12 ENCOUNTER — Telehealth: Payer: Self-pay | Admitting: Emergency Medicine

## 2020-02-12 ENCOUNTER — Other Ambulatory Visit: Payer: Self-pay

## 2020-02-12 VITALS — BP 122/70 | HR 84 | Ht 65.0 in | Wt 169.0 lb

## 2020-02-12 DIAGNOSIS — Z720 Tobacco use: Secondary | ICD-10-CM | POA: Diagnosis not present

## 2020-02-12 DIAGNOSIS — R918 Other nonspecific abnormal finding of lung field: Secondary | ICD-10-CM | POA: Diagnosis not present

## 2020-02-12 NOTE — Patient Instructions (Addendum)
We will work on setting up bronchoscopy to evaluate pulmonary nodules.  Hopefully we can get this done in the next 2 weeks. Stop your Plavix now We will stop your aspirin 2 days prior to your procedure We will get a copy of your CT chest from [facility] Keep albuterol available to use 2 puffs up to every 4 hours if needed for shortness of breath, chest tightness, wheezing.  Follow with Dr. Lamonte Sakai next available with full pulmonary function testing on the same day.

## 2020-02-12 NOTE — Assessment & Plan Note (Signed)
Bilateral pulmonary nodules, 2 in the right upper lobe, 1 in the lingula.  Concerning for primary lung cancer with possible metastatic disease versus synchronous primaries.  Discussed the pros and cons, risks and benefits of bronchoscopy.  He understands and agrees to proceed.  I think this is the best strategy to get adequate tissue to plan therapeutics.  We will stop his Plavix now and I will try to get this set up for next week or 8/10.

## 2020-02-12 NOTE — H&P (View-Only) (Signed)
Subjective:    Patient ID: Robert Tran, male    DOB: 1940-10-21, 79 y.o.   MRN: [id number]  HPI 79 year old smoker (18 pack years) with a history of CAD, hypertension, chronic kidney disease stage III, diabetes type 2, hyperlipidemia, polycythemia vera, diverticulosis (previous GI bleed).  He is referred today by Dr. Marin Olp from oncology for further evaluation of of an abnormal CT chest, PET scan.  Right upper lobe nodular density was noted on chest x-ray 5/6 when he had a cardiac catheterization which prompted CT chest and then PET.  He is feeling well, denies SOB. Good functional capacity. He coughs daily. Has a lot of allergy drainage. Has albuterol but never uses it. No CP, no wheeze  CT scan of the chest 01/15/2020 reviewed by me, shows a 3.1 x 1.9 spiculated right upper lobe nodule at the right major fissure, 2.1 x 1.6 more anterior right upper lobe spiculated nodule, 1.6 x 1.0 cm lingular spiculated density.  Focal bulge involving visualized portion of the midpole of the left kidney  PET scan 02/01/2020 reviewed by me, shows hypermetabolism in the 2 right upper lobe nodules as well as mild activity and the lingular nodule.  Note made of left upper pole renal cyst without hypermetabolism, diverticular disease, no evidence of distant metastasis  Cardiac catheterization 11/19/2019 ([facility]) >> 1 vessel CAD with severe ostial RCA stenosis developing left to right collaterals, preserved LV function.  Medical therapy recommended.    Review of Systems As per HPI  Past Medical History:  Diagnosis Date  . Diverticulosis of intestine with bleeding 07/19/2017  . Diverticulosis of large intestine with hemorrhage 07/19/2017  . Iron deficiency anemia 07/19/2017     No family history on file.   Social History   Socioeconomic History  . Marital status: Married    Spouse name: Not on file  . Number of children: Not on file  . Years of education: Not on file  . Highest education level: Not on file    Occupational History  . Not on file  Tobacco Use  . Smoking status: Current Every Day Smoker    Packs/day: 1.00    Years: 63.00    Pack years: 63.00    Types: Cigarettes    Start date: 07/06/1941  . Smokeless tobacco: Never Used  . Tobacco comment: 3-5 cigs daily  Vaping Use  . Vaping Use: Never used  Substance and Sexual Activity  . Alcohol use: Not on file  . Drug use: Never  . Sexual activity: Not on file  Other Topics Concern  . Not on file  Social History Narrative  . Not on file   Social Determinants of Health   Financial Resource Strain:   . Difficulty of Paying Living Expenses:   Food Insecurity:   . Worried About Charity fundraiser in the Last Year:   . Arboriculturist in the Last Year:   Transportation Needs:   . Film/video editor (Medical):   Marland Kitchen Lack of Transportation (Non-Medical):   Physical Activity:   . Days of Exercise per Week:   . Minutes of Exercise per Session:   Stress:   . Feeling of Stress :   Social Connections:   . Frequency of Communication with Friends and Family:   . Frequency of Social Gatherings with Friends and Family:   . Attends Religious Services:   . Active Member of Clubs or Organizations:   . Attends Archivist Meetings:   Marland Kitchen Marital  Status:   Intimate Partner Violence:   . Fear of Current or Ex-Partner:   . Emotionally Abused:   Marland Kitchen Physically Abused:   . Sexually Abused:      Allergies  Allergen Reactions  . Pravastatin Sodium Other (See Comments)    Patient stated,"it messed up my back and lege, I couldn't walk."  . Minocycline Nausea Only  . Sulfa Antibiotics Nausea Only  . Sulfa Drugs Cross Reactors Nausea Only     Outpatient Medications Prior to Visit  Medication Sig Dispense Refill  . albuterol (PROVENTIL HFA;VENTOLIN HFA) 108 (90 Base) MCG/ACT inhaler Inhale into the lungs.    Marland Kitchen amLODipine (NORVASC) 10 MG tablet Take 10 mg by mouth daily.    Marland Kitchen aspirin EC 81 MG tablet Take 81 mg by mouth daily.     Marland Kitchen atorvastatin (LIPITOR) 80 MG tablet Take 40 mg by mouth daily.    . Cholecalciferol (VITAMIN D) 125 MCG (5000 UT) CAPS Take 5,000 Units by mouth daily. 03/08/15 taking 5000 u    . clopidogrel (PLAVIX) 75 MG tablet Take 75 mg by mouth daily.    . fluocinonide (LIDEX) 0.05 % external solution Apply 1 application topically daily.     . folic acid (FOLVITE) 245 MCG tablet Take 400 mcg by mouth every morning.     Marland Kitchen glimepiride (AMARYL) 1 MG tablet Take 1 mg by mouth every morning.    . meclizine (ANTIVERT) 25 MG tablet Take 1 tablet (25 mg total) by mouth 3 (three) times daily as needed. 90 tablet 1  . metFORMIN (GLUCOPHAGE-XR) 500 MG 24 hr tablet Take by mouth in the morning and at bedtime.     . metoprolol succinate (TOPROL-XL) 25 MG 24 hr tablet Take 25 mg by mouth daily.    Marland Kitchen oxyCODONE (OXY IR/ROXICODONE) 5 MG immediate release tablet SMARTSIG:1 Tablet(s) By Mouth Every 8-12 Hours PRN    . ramipril (ALTACE) 10 MG tablet Take 10 mg by mouth daily.    Marland Kitchen torsemide (DEMADEX) 10 MG tablet Take by mouth daily as needed.      No facility-administered medications prior to visit.        Objective:   Physical Exam  Vitals:   02/12/20 0822  BP: 122/70  Pulse: 84  SpO2: 96%  Weight: 169 lb (76.7 kg)  Height: 5\' 5"  (1.651 m)   Gen: Pleasant, well-nourished, in no distress,  normal affect  ENT: No lesions,  mouth clear,  oropharynx clear, no postnasal drip  Neck: No JVD, no stridor  Lungs: No use of accessory muscles, coarse, no crackles or wheezing on normal respiration, no wheeze on forced expiration  Cardiovascular: RRR, heart sounds normal, no murmur or gallops, no peripheral edema  Musculoskeletal: No deformities, no cyanosis or clubbing  Neuro: alert, awake, non focal  Skin: Warm, no lesions or rash     Assessment & Plan:  Lung nodules Bilateral pulmonary nodules, 2 in the right upper lobe, 1 in the lingula.  Concerning for primary lung cancer with possible metastatic  disease versus synchronous primaries.  Discussed the pros and cons, risks and benefits of bronchoscopy.  He understands and agrees to proceed.  I think this is the best strategy to get adequate tissue to plan therapeutics.  We will stop his Plavix now and I will try to get this set up for next week or 8/10.  Tobacco use Discussed cessation with him.  He has cut down significantly and is interested in cutting down further.  We  will talk about this going forward.  He needs pulmonary function testing and I will arrange for him to have this on his return visit.  Keep albuterol available.  He may benefit from scheduled bronchodilator therapy going forward.  Baltazar Apo, MD, PhD 02/12/2020, 9:06 AM [facility] [phone] or if no answer [phone]

## 2020-02-12 NOTE — Assessment & Plan Note (Signed)
Discussed cessation with him.  He has cut down significantly and is interested in cutting down further.  We will talk about this going forward.  He needs pulmonary function testing and I will arrange for him to have this on his return visit.  Keep albuterol available.  He may benefit from scheduled bronchodilator therapy going forward.

## 2020-02-12 NOTE — Telephone Encounter (Signed)
Thank you :)

## 2020-02-12 NOTE — Telephone Encounter (Signed)
Will forward to Dr Lamonte Sakai as Juluis Rainier that disc was dropped off for him to review

## 2020-02-12 NOTE — Progress Notes (Signed)
Subjective:    Patient ID: Robert Tran, male    DOB: Dec 23, 1940, 79 y.o.   MRN: [id number]  HPI 79 year old smoker (83 pack years) with a history of CAD, hypertension, chronic kidney disease stage III, diabetes type 2, hyperlipidemia, polycythemia vera, diverticulosis (previous GI bleed).  He is referred today by Dr. Marin Olp from oncology for further evaluation of of an abnormal CT chest, PET scan.  Right upper lobe nodular density was noted on chest x-ray 5/6 when he had a cardiac catheterization which prompted CT chest and then PET.  He is feeling well, denies SOB. Good functional capacity. He coughs daily. Has a lot of allergy drainage. Has albuterol but never uses it. No CP, no wheeze  CT scan of the chest 01/15/2020 reviewed by me, shows a 3.1 x 1.9 spiculated right upper lobe nodule at the right major fissure, 2.1 x 1.6 more anterior right upper lobe spiculated nodule, 1.6 x 1.0 cm lingular spiculated density.  Focal bulge involving visualized portion of the midpole of the left kidney  PET scan 02/01/2020 reviewed by me, shows hypermetabolism in the 2 right upper lobe nodules as well as mild activity and the lingular nodule.  Note made of left upper pole renal cyst without hypermetabolism, diverticular disease, no evidence of distant metastasis  Cardiac catheterization 11/19/2019 ([facility]) >> 1 vessel CAD with severe ostial RCA stenosis developing left to right collaterals, preserved LV function.  Medical therapy recommended.    Review of Systems As per HPI  Past Medical History:  Diagnosis Date  . Diverticulosis of intestine with bleeding 07/19/2017  . Diverticulosis of large intestine with hemorrhage 07/19/2017  . Iron deficiency anemia 07/19/2017     No family history on file.   Social History   Socioeconomic History  . Marital status: Married    Spouse name: Not on file  . Number of children: Not on file  . Years of education: Not on file  . Highest education level: Not on file    Occupational History  . Not on file  Tobacco Use  . Smoking status: Current Every Day Smoker    Packs/day: 1.00    Years: 63.00    Pack years: 63.00    Types: Cigarettes    Start date: 07/06/1941  . Smokeless tobacco: Never Used  . Tobacco comment: 3-5 cigs daily  Vaping Use  . Vaping Use: Never used  Substance and Sexual Activity  . Alcohol use: Not on file  . Drug use: Never  . Sexual activity: Not on file  Other Topics Concern  . Not on file  Social History Narrative  . Not on file   Social Determinants of Health   Financial Resource Strain:   . Difficulty of Paying Living Expenses:   Food Insecurity:   . Worried About Charity fundraiser in the Last Year:   . Arboriculturist in the Last Year:   Transportation Needs:   . Film/video editor (Medical):   Marland Kitchen Lack of Transportation (Non-Medical):   Physical Activity:   . Days of Exercise per Week:   . Minutes of Exercise per Session:   Stress:   . Feeling of Stress :   Social Connections:   . Frequency of Communication with Friends and Family:   . Frequency of Social Gatherings with Friends and Family:   . Attends Religious Services:   . Active Member of Clubs or Organizations:   . Attends Archivist Meetings:   Marland Kitchen Marital  Status:   Intimate Partner Violence:   . Fear of Current or Ex-Partner:   . Emotionally Abused:   Marland Kitchen Physically Abused:   . Sexually Abused:      Allergies  Allergen Reactions  . Pravastatin Sodium Other (See Comments)    Patient stated,"it messed up my back and lege, I couldn't walk."  . Minocycline Nausea Only  . Sulfa Antibiotics Nausea Only  . Sulfa Drugs Cross Reactors Nausea Only     Outpatient Medications Prior to Visit  Medication Sig Dispense Refill  . albuterol (PROVENTIL HFA;VENTOLIN HFA) 108 (90 Base) MCG/ACT inhaler Inhale into the lungs.    Marland Kitchen amLODipine (NORVASC) 10 MG tablet Take 10 mg by mouth daily.    Marland Kitchen aspirin EC 81 MG tablet Take 81 mg by mouth daily.     Marland Kitchen atorvastatin (LIPITOR) 80 MG tablet Take 40 mg by mouth daily.    . Cholecalciferol (VITAMIN D) 125 MCG (5000 UT) CAPS Take 5,000 Units by mouth daily. 03/08/15 taking 5000 u    . clopidogrel (PLAVIX) 75 MG tablet Take 75 mg by mouth daily.    . fluocinonide (LIDEX) 0.05 % external solution Apply 1 application topically daily.     . folic acid (FOLVITE) 509 MCG tablet Take 400 mcg by mouth every morning.     Marland Kitchen glimepiride (AMARYL) 1 MG tablet Take 1 mg by mouth every morning.    . meclizine (ANTIVERT) 25 MG tablet Take 1 tablet (25 mg total) by mouth 3 (three) times daily as needed. 90 tablet 1  . metFORMIN (GLUCOPHAGE-XR) 500 MG 24 hr tablet Take by mouth in the morning and at bedtime.     . metoprolol succinate (TOPROL-XL) 25 MG 24 hr tablet Take 25 mg by mouth daily.    Marland Kitchen oxyCODONE (OXY IR/ROXICODONE) 5 MG immediate release tablet SMARTSIG:1 Tablet(s) By Mouth Every 8-12 Hours PRN    . ramipril (ALTACE) 10 MG tablet Take 10 mg by mouth daily.    Marland Kitchen torsemide (DEMADEX) 10 MG tablet Take by mouth daily as needed.      No facility-administered medications prior to visit.        Objective:   Physical Exam  Vitals:   02/12/20 0822  BP: 122/70  Pulse: 84  SpO2: 96%  Weight: 169 lb (76.7 kg)  Height: 5\' 5"  (1.651 m)   Gen: Pleasant, well-nourished, in no distress,  normal affect  ENT: No lesions,  mouth clear,  oropharynx clear, no postnasal drip  Neck: No JVD, no stridor  Lungs: No use of accessory muscles, coarse, no crackles or wheezing on normal respiration, no wheeze on forced expiration  Cardiovascular: RRR, heart sounds normal, no murmur or gallops, no peripheral edema  Musculoskeletal: No deformities, no cyanosis or clubbing  Neuro: alert, awake, non focal  Skin: Warm, no lesions or rash     Assessment & Plan:  Lung nodules Bilateral pulmonary nodules, 2 in the right upper lobe, 1 in the lingula.  Concerning for primary lung cancer with possible metastatic  disease versus synchronous primaries.  Discussed the pros and cons, risks and benefits of bronchoscopy.  He understands and agrees to proceed.  I think this is the best strategy to get adequate tissue to plan therapeutics.  We will stop his Plavix now and I will try to get this set up for next week or 8/10.  Tobacco use Discussed cessation with him.  He has cut down significantly and is interested in cutting down further.  We  will talk about this going forward.  He needs pulmonary function testing and I will arrange for him to have this on his return visit.  Keep albuterol available.  He may benefit from scheduled bronchodilator therapy going forward.  Baltazar Apo, MD, PhD 02/12/2020, 9:06 AM [facility] [phone] or if no answer [phone]

## 2020-02-15 ENCOUNTER — Other Ambulatory Visit (HOSPITAL_COMMUNITY)
Admission: RE | Admit: 2020-02-15 | Discharge: 2020-02-15 | Disposition: A | Discharge status: Home / Self Care | Payer: Medicare Other | Source: Ambulatory Visit | Attending: Emergency Medicine | Admitting: Emergency Medicine

## 2020-02-15 DIAGNOSIS — Z20822 Contact with and (suspected) exposure to covid-19: Secondary | ICD-10-CM | POA: Insufficient documentation

## 2020-02-15 DIAGNOSIS — Z01812 Encounter for preprocedural laboratory examination: Secondary | ICD-10-CM | POA: Insufficient documentation

## 2020-02-15 LAB — SARS CORONAVIRUS 2 (TAT 6-24 HRS): SARS Coronavirus 2: NEGATIVE

## 2020-02-17 ENCOUNTER — Encounter (HOSPITAL_COMMUNITY): Payer: Self-pay | Admitting: Emergency Medicine

## 2020-02-17 NOTE — Progress Notes (Signed)
Anesthesia Chart Review: Same-day work-up  Follows with hematology for hx of polycythemia vera, receives phlebotomy to maintain hct < 45%.  Follows with cardiology at [facility] for medical management for coronary artery disease, HTN, HLD. He had previous abnormal nuclear stress test and underwent left heart cath 11/19/2019 and was found to have single-vessel coronary disease with developing collaterals and normal LV function.  Medical management recommended.  He is on Plavix.  Last seen 01/11/20, stable from a cardiovascular standpoint.   LD Plavix 02/13/20.  Hx of CKD III.  DMII, last A1c 7.8 on 09/01/19.  Will need DOS labs and eval.  EKG 10/13/2019 (Care Everywhere): Sinus rhythm with sinus arrhythmia.  Rate 91.  Nonspecific T wave changes.  CT Chest 01/15/20 (care everywhere): IMPRESSION:  1. Two spiculated densities are noted in the right upper lobe, the  largest measuring 3.1 x 1.9 cm. Also noted is a 16 x 10 mm  spiculated density in lingular segment of left upper lobe concerning  for malignancy or metastatic disease. PET scan is recommended for  further evaluation. These results will be called to the ordering  clinician or representative by the Radiologist Assistant, and  communication documented in the PACS or zVision Dashboard.  2. Focal bulge is seen involving visualized portion of mid pole of  left kidney concerning for possible mass or neoplasm. CT or MRI scan  may be performed for further evaluation.  3. Moderate coronary artery calcifications are noted suggesting  coronary artery disease.   Aortic Atherosclerosis (ICD10-I70.0) and Emphysema (ICD10-J43.9).    LHC 11/19/19 (care everywhere): 1 vessel CAD/ severe ostial RCA stenosis with developing L to R  collaterals  Preserved LV function (echo)   REC: Medical therapy since patient has no clear ischemic symptoms.   Return to lab for PCI based on clinical need. Aggressive risk factor  Management   TTE 10/20/19 (care  everywhere): SUMMARY  The left ventricular size is normal.  There is normal left ventricular wall thickness.  Left ventricular systolic function is normal.  LV ejection fraction = 55-60%.  The left ventricular wall motion is normal.  Diffuse thickening of the aortic valve with preserved cusp opening.  There is mild tricuspid regurgitation.    Wynonia Musty [facility] Short Stay Center/Anesthesiology Phone [phone] 02/17/2020 11:16 AM

## 2020-02-17 NOTE — Progress Notes (Signed)
Patient denies shortness of breath, fever, cough or chest pain.  PCP - Dr Salvadore Oxford Cardiologist - Dr Mateo Flow  Chest x-ray - 01/15/20 CE [facility]) EKG - 10/13/19 CE -Requested Tracing Stress Test - 10/20/19 [facility]) ECHO - 10/20/19 [facility]) Cardiac Cath - 11/19/19 CE  Fasting Blood Sugar - 130s Checks Blood Sugar  1  times a day  . Do not take oral diabetes medicines (Glimepiride, metformin) the morning of surgery.  . If your blood sugar is less than 70 mg/dL, you will need to treat for low blood sugar: o Treat a low blood sugar (less than 70 mg/dL) with  cup of clear juice (cranberry or apple), 4 glucose tablets, OR glucose gel. o Recheck blood sugar in 15 minutes after treatment (to make sure it is greater than 70 mg/dL). If your blood sugar is not greater than 70 mg/dL on recheck, call [phone] for further instructions.   Blood Thinner Instructions:  Plavix last dose was on 02/13/20.  Aspirin Instructions: ASA last dose was on 02/16/20.  Anesthesia review: Yes  STOP now taking any Aspirin (unless otherwise instructed by your surgeon), Aleve, Naproxen, Ibuprofen, Motrin, Advil, Goody's, BC's, all herbal medications, fish oil, and all vitamins.   Coronavirus Screening Covid test on 02/15/20 was negative.  Patient verbalized understanding of instructions that were given via phone.

## 2020-02-17 NOTE — Anesthesia Preprocedure Evaluation (Addendum)
Anesthesia Evaluation  Patient identified by MRN, date of birth, ID band Patient awake    Reviewed: Allergy & Precautions, NPO status , Patient's Chart, lab work & pertinent test results, reviewed documented beta blocker date and time   Airway Mallampati: II   Neck ROM: Full    Dental  (+) Poor Dentition, Missing, Caps, Dental Advisory Given,    Pulmonary shortness of breath and with exertion, COPD,  COPD inhaler, Current Smoker and Patient abstained from smoking.,  Bilateral pulmonary nodules   breath sounds clear to auscultation + decreased breath sounds      Cardiovascular Exercise Tolerance: Poor hypertension, Pt. on medications and Pt. on home beta blockers + CAD  Normal cardiovascular exam Rhythm:Regular Rate:Normal  Severe ostial RCA 1v CAD , with good collateral flow from left to right LVEF 55-60%   Neuro/Psych negative neurological ROS  negative psych ROS   GI/Hepatic Neg liver ROS, Diverticulosis   Endo/Other  diabetes, Well Controlled, Type 2, Oral Hypoglycemic AgentsHyperlipidemia  Renal/GU Renal InsufficiencyRenal diseaseHx/o renal calculi   BPH    Musculoskeletal   Abdominal   Peds  Hematology  (+) anemia , Polycythemia vera IDA Plavix therapy- last dose 7/31   Anesthesia Other Findings   Reproductive/Obstetrics                          Anesthesia Physical Anesthesia Plan  ASA: III  Anesthesia Plan: General   Post-op Pain Management:    Induction: Intravenous  PONV Risk Score and Plan: 1 and Treatment may vary due to age or medical condition and Ondansetron  Airway Management Planned: Oral ETT  Additional Equipment:   Intra-op Plan:   Post-operative Plan: Extubation in OR  Informed Consent: I have reviewed the patients History and Physical, chart, labs and discussed the procedure including the risks, benefits and alternatives for the proposed anesthesia with  the patient or authorized representative who has indicated his/her understanding and acceptance.     Dental advisory given  Plan Discussed with: CRNA and Anesthesiologist  Anesthesia Plan Comments: (PAT note by Karoline Caldwell, PA-C: Follows with hematology for hx of polycythemia vera, receives phlebotomy to maintain hct < 45%.  Follows with cardiology at [facility] for medical management for coronary artery disease, HTN, HLD. He had previous abnormal nuclear stress test and underwent left heart cath 11/19/2019 and was found to have single-vessel coronary disease with developing collaterals and normal LV function.  Medical management recommended.  He is on Plavix.  Last seen 01/11/20, stable from a cardiovascular standpoint.   LD Plavix 02/13/20.  Hx of CKD III.  DMII, last A1c 7.8 on 09/01/19.  Will need DOS labs and eval.  EKG 10/13/2019 (Care Everywhere): Sinus rhythm with sinus arrhythmia.  Rate 91.  Nonspecific T wave changes.  CT Chest 01/15/20 (care everywhere): IMPRESSION:  1. Two spiculated densities are noted in the right upper lobe, the  largest measuring 3.1 x 1.9 cm. Also noted is a 16 x 10 mm  spiculated density in lingular segment of left upper lobe concerning  for malignancy or metastatic disease. PET scan is recommended for  further evaluation. These results will be called to the ordering  clinician or representative by the Radiologist Assistant, and  communication documented in the PACS or zVision Dashboard.  2. Focal bulge is seen involving visualized portion of mid pole of  left kidney concerning for possible mass or neoplasm. CT or MRI scan  may be performed for further evaluation.  3. Moderate coronary artery calcifications are noted suggesting  coronary artery disease.   Aortic Atherosclerosis (ICD10-I70.0) and Emphysema (ICD10-J43.9).    LHC 11/19/19 (care everywhere): 1 vessel CAD/ severe ostial RCA stenosis with developing L to R  collaterals  Preserved LV  function (echo)   REC: Medical therapy since patient has no clear ischemic symptoms.   Return to lab for PCI based on clinical need. Aggressive risk factor  Management   TTE 10/20/19 (care everywhere): SUMMARY  The left ventricular size is normal.  There is normal left ventricular wall thickness.  Left ventricular systolic function is normal.  LV ejection fraction = 55-60%.  The left ventricular wall motion is normal.  Diffuse thickening of the aortic valve with preserved cusp opening.  There is mild tricuspid regurgitation.   )      Anesthesia Quick Evaluation

## 2020-02-18 ENCOUNTER — Ambulatory Visit (HOSPITAL_COMMUNITY)
Admission: RE | Admit: 2020-02-18 | Discharge: 2020-02-18 | Disposition: A | Discharge status: Home / Self Care | Payer: Medicare Other | Attending: Emergency Medicine | Admitting: Emergency Medicine

## 2020-02-18 ENCOUNTER — Ambulatory Visit (HOSPITAL_COMMUNITY): Payer: Medicare Other

## 2020-02-18 ENCOUNTER — Ambulatory Visit (HOSPITAL_COMMUNITY): Payer: Medicare Other | Admitting: Physician Assistant

## 2020-02-18 ENCOUNTER — Encounter (HOSPITAL_COMMUNITY)
Admission: RE | Disposition: A | Discharge status: Home / Self Care | Payer: Self-pay | Source: Home / Self Care | Attending: Emergency Medicine

## 2020-02-18 ENCOUNTER — Encounter (HOSPITAL_COMMUNITY): Payer: Self-pay | Admitting: Emergency Medicine

## 2020-02-18 ENCOUNTER — Other Ambulatory Visit: Payer: Self-pay

## 2020-02-18 DIAGNOSIS — I129 Hypertensive chronic kidney disease with stage 1 through stage 4 chronic kidney disease, or unspecified chronic kidney disease: Secondary | ICD-10-CM | POA: Diagnosis not present

## 2020-02-18 DIAGNOSIS — D45 Polycythemia vera: Secondary | ICD-10-CM | POA: Insufficient documentation

## 2020-02-18 DIAGNOSIS — Z882 Allergy status to sulfonamides status: Secondary | ICD-10-CM | POA: Diagnosis not present

## 2020-02-18 DIAGNOSIS — E1122 Type 2 diabetes mellitus with diabetic chronic kidney disease: Secondary | ICD-10-CM | POA: Insufficient documentation

## 2020-02-18 DIAGNOSIS — Z79899 Other long term (current) drug therapy: Secondary | ICD-10-CM | POA: Insufficient documentation

## 2020-02-18 DIAGNOSIS — F17210 Nicotine dependence, cigarettes, uncomplicated: Secondary | ICD-10-CM | POA: Insufficient documentation

## 2020-02-18 DIAGNOSIS — Z7951 Long term (current) use of inhaled steroids: Secondary | ICD-10-CM | POA: Insufficient documentation

## 2020-02-18 DIAGNOSIS — Z9889 Other specified postprocedural states: Secondary | ICD-10-CM

## 2020-02-18 DIAGNOSIS — Z888 Allergy status to other drugs, medicaments and biological substances status: Secondary | ICD-10-CM | POA: Diagnosis not present

## 2020-02-18 DIAGNOSIS — R911 Solitary pulmonary nodule: Secondary | ICD-10-CM

## 2020-02-18 DIAGNOSIS — N183 Chronic kidney disease, stage 3 unspecified: Secondary | ICD-10-CM | POA: Insufficient documentation

## 2020-02-18 DIAGNOSIS — Z87442 Personal history of urinary calculi: Secondary | ICD-10-CM | POA: Insufficient documentation

## 2020-02-18 DIAGNOSIS — J439 Emphysema, unspecified: Secondary | ICD-10-CM | POA: Diagnosis not present

## 2020-02-18 DIAGNOSIS — I2510 Atherosclerotic heart disease of native coronary artery without angina pectoris: Secondary | ICD-10-CM | POA: Diagnosis not present

## 2020-02-18 DIAGNOSIS — R918 Other nonspecific abnormal finding of lung field: Secondary | ICD-10-CM | POA: Diagnosis not present

## 2020-02-18 DIAGNOSIS — C3411 Malignant neoplasm of upper lobe, right bronchus or lung: Secondary | ICD-10-CM | POA: Insufficient documentation

## 2020-02-18 DIAGNOSIS — R0602 Shortness of breath: Secondary | ICD-10-CM | POA: Insufficient documentation

## 2020-02-18 DIAGNOSIS — I700 Atherosclerosis of aorta: Secondary | ICD-10-CM | POA: Insufficient documentation

## 2020-02-18 DIAGNOSIS — K5790 Diverticulosis of intestine, part unspecified, without perforation or abscess without bleeding: Secondary | ICD-10-CM | POA: Insufficient documentation

## 2020-02-18 DIAGNOSIS — J984 Other disorders of lung: Secondary | ICD-10-CM | POA: Diagnosis present

## 2020-02-18 DIAGNOSIS — Z7984 Long term (current) use of oral hypoglycemic drugs: Secondary | ICD-10-CM | POA: Diagnosis not present

## 2020-02-18 DIAGNOSIS — N400 Enlarged prostate without lower urinary tract symptoms: Secondary | ICD-10-CM | POA: Diagnosis not present

## 2020-02-18 HISTORY — DX: Hyperlipidemia, unspecified: E78.5

## 2020-02-18 HISTORY — DX: Type 2 diabetes mellitus without complications: E11.9

## 2020-02-18 HISTORY — DX: Essential (primary) hypertension: I10

## 2020-02-18 HISTORY — PX: BRONCHIAL BIOPSY: SHX5109

## 2020-02-18 HISTORY — PX: BRONCHIAL NEEDLE ASPIRATION BIOPSY: SHX5106

## 2020-02-18 HISTORY — DX: Dyspnea, unspecified: R06.00

## 2020-02-18 HISTORY — DX: Unspecified chronic bronchitis: J42

## 2020-02-18 HISTORY — DX: Personal history of urinary calculi: Z87.442

## 2020-02-18 HISTORY — PX: BRONCHIAL WASHINGS: SHX5105

## 2020-02-18 HISTORY — PX: BRONCHIAL BRUSHINGS: SHX5108

## 2020-02-18 HISTORY — DX: Chronic kidney disease, unspecified: N18.9

## 2020-02-18 HISTORY — PX: VIDEO BRONCHOSCOPY WITH ENDOBRONCHIAL NAVIGATION: SHX6175

## 2020-02-18 HISTORY — DX: Atherosclerotic heart disease of native coronary artery without angina pectoris: I25.10

## 2020-02-18 HISTORY — DX: Malignant melanoma of skin, unspecified: C43.9

## 2020-02-18 LAB — APTT: aPTT: 31 seconds (ref 24–36)

## 2020-02-18 LAB — CBC
HCT: 47.2 % (ref 39.0–52.0)
Hemoglobin: 14.7 g/dL (ref 13.0–17.0)
MCH: 28.6 pg (ref 26.0–34.0)
MCHC: 31.1 g/dL (ref 30.0–36.0)
MCV: 91.8 fL (ref 80.0–100.0)
Platelets: 257 10*3/uL (ref 150–400)
RBC: 5.14 MIL/uL (ref 4.22–5.81)
RDW: 15 % (ref 11.5–15.5)
WBC: 10.3 10*3/uL (ref 4.0–10.5)
nRBC: 0 % (ref 0.0–0.2)

## 2020-02-18 LAB — BASIC METABOLIC PANEL
Anion gap: 8 (ref 5–15)
BUN: 21 mg/dL (ref 8–23)
CO2: 20 mmol/L — ABNORMAL LOW (ref 22–32)
Calcium: 9.6 mg/dL (ref 8.9–10.3)
Chloride: 110 mmol/L (ref 98–111)
Creatinine, Ser: 1.26 mg/dL — ABNORMAL HIGH (ref 0.61–1.24)
GFR calc Af Amer: >60 mL/min (ref >60)
GFR calc non Af Amer: 54 mL/min — ABNORMAL LOW (ref >60)
Glucose, Bld: 149 mg/dL — ABNORMAL HIGH (ref 70–99)
Potassium: 4.2 mmol/L (ref 3.5–5.1)
Sodium: 138 mmol/L (ref 135–145)

## 2020-02-18 LAB — PROTIME-INR
INR: 1.1 (ref 0.8–1.2)
Prothrombin Time: 13.3 seconds (ref 11.4–15.2)

## 2020-02-18 LAB — GLUCOSE, CAPILLARY
Glucose-Capillary: 135 mg/dL — ABNORMAL HIGH (ref 70–99)
Glucose-Capillary: 133 mg/dL — ABNORMAL HIGH (ref 70–99)

## 2020-02-18 SURGERY — VIDEO BRONCHOSCOPY WITH ENDOBRONCHIAL NAVIGATION
Anesthesia: General

## 2020-02-18 MED ORDER — LIDOCAINE 2% (20 MG/ML) 5 ML SYRINGE
INTRAMUSCULAR | Status: DC | PRN
  Administered 2020-02-18: 80 mg via INTRAVENOUS

## 2020-02-18 MED ORDER — PHENYLEPHRINE HCL-NACL 10-0.9 MG/250ML-% IV SOLN
INTRAVENOUS | Status: DC | PRN
Start: 2020-02-18 — End: 2020-02-18
  Administered 2020-02-18: 25 ug/min via INTRAVENOUS

## 2020-02-18 MED ORDER — ROCURONIUM BROMIDE 10 MG/ML (PF) SYRINGE
PREFILLED_SYRINGE | INTRAVENOUS | Status: DC | PRN
  Administered 2020-02-18: 10 mg via INTRAVENOUS
  Administered 2020-02-18: 50 mg via INTRAVENOUS

## 2020-02-18 MED ORDER — ONDANSETRON HCL 4 MG/2ML IJ SOLN: 4.0000 mg | Freq: Once | INTRAMUSCULAR | Status: DC | PRN

## 2020-02-18 MED ORDER — LACTATED RINGERS IV SOLN: INTRAVENOUS | Status: DC

## 2020-02-18 MED ORDER — FENTANYL CITRATE (PF) 100 MCG/2ML IJ SOLN
INTRAMUSCULAR | Status: DC | PRN
  Administered 2020-02-18: 50 ug via INTRAVENOUS

## 2020-02-18 MED ORDER — CHLORHEXIDINE GLUCONATE 0.12 % MT SOLN
OROMUCOSAL | Status: AC
  Administered 2020-02-18: 15 mL via OROMUCOSAL
  Filled 2020-02-18: qty 15

## 2020-02-18 MED ORDER — CHLORHEXIDINE GLUCONATE 0.12 % MT SOLN
15.0000 mL | Freq: Once | OROMUCOSAL | Status: AC
  Filled 2020-02-18: qty 15

## 2020-02-18 MED ORDER — CLOPIDOGREL BISULFATE 75 MG PO TABS: 75.0000 mg | ORAL_TABLET | Freq: Every day | ORAL | Status: AC

## 2020-02-18 MED ORDER — SUGAMMADEX SODIUM 200 MG/2ML IV SOLN
INTRAVENOUS | Status: DC | PRN
  Administered 2020-02-18: 200 mg via INTRAVENOUS

## 2020-02-18 MED ORDER — PHENYLEPHRINE 40 MCG/ML (10ML) SYRINGE FOR IV PUSH (FOR BLOOD PRESSURE SUPPORT)
PREFILLED_SYRINGE | INTRAVENOUS | Status: DC | PRN
  Administered 2020-02-18 (×2): 80 ug via INTRAVENOUS
  Administered 2020-02-18 (×2): 40 ug via INTRAVENOUS
  Administered 2020-02-18: 80 ug via INTRAVENOUS

## 2020-02-18 MED ORDER — PROPOFOL 10 MG/ML IV BOLUS
INTRAVENOUS | Status: DC | PRN
  Administered 2020-02-18: 150 mg via INTRAVENOUS
  Administered 2020-02-18: 20 mg via INTRAVENOUS

## 2020-02-18 MED ORDER — DEXAMETHASONE SODIUM PHOSPHATE 4 MG/ML IJ SOLN
INTRAMUSCULAR | Status: DC | PRN
Start: 2020-02-18 — End: 2020-02-18
  Administered 2020-02-18: 4 mg via INTRAVENOUS

## 2020-02-18 MED ORDER — ONDANSETRON HCL 4 MG/2ML IJ SOLN
INTRAMUSCULAR | Status: DC | PRN
  Administered 2020-02-18: 4 mg via INTRAVENOUS

## 2020-02-18 MED ORDER — FENTANYL CITRATE (PF) 100 MCG/2ML IJ SOLN
INTRAMUSCULAR | Status: AC
  Filled 2020-02-18: qty 2

## 2020-02-18 MED ORDER — LACTATED RINGERS IV SOLN
INTRAVENOUS | Status: DC | PRN
Start: 2020-02-18 — End: 2020-02-18

## 2020-02-18 MED ORDER — FENTANYL CITRATE (PF) 100 MCG/2ML IJ SOLN: 25.0000 ug | INTRAMUSCULAR | Status: DC | PRN

## 2020-02-18 NOTE — Interval H&P Note (Signed)
History and Physical Interval Note:  02/18/2020 7:16 AM  Robert Tran  has presented today for surgery, with the diagnosis of bilateral pulmonary nodules.  The various methods of treatment have been discussed with the patient and family. After consideration of risks, benefits and other options for treatment, the patient has consented to  Procedure(s): [place] (N/A) as a surgical intervention.  The patient's history has been reviewed, patient examined, no change in status, stable for surgery.  I have reviewed the patient's chart and labs.  Questions were answered to the patient's satisfaction.     Collene Gobble

## 2020-02-18 NOTE — Anesthesia Procedure Notes (Signed)
Procedure Name: Intubation Date/Time: 02/18/2020 7:52 AM Performed by: Jenne Campus, CRNA Pre-anesthesia Checklist: Patient identified, Emergency Drugs available, Suction available and Patient being monitored Patient Re-evaluated:Patient Re-evaluated prior to induction Oxygen Delivery Method: Circle System Utilized Preoxygenation: Pre-oxygenation with 100% oxygen Induction Type: IV induction Ventilation: Mask ventilation without difficulty Laryngoscope Size: Miller and 3 Grade View: Grade I Tube type: Oral Tube size: 8.5 mm Number of attempts: 1 Airway Equipment and Method: Stylet and Oral airway Placement Confirmation: ETT inserted through vocal cords under direct vision,  positive ETCO2 and breath sounds checked- equal and bilateral Secured at: 22 cm Tube secured with: Tape Dental Injury: Teeth and Oropharynx as per pre-operative assessment

## 2020-02-18 NOTE — Transfer of Care (Signed)
Immediate Anesthesia Transfer of Care Note  Patient: [name]  Procedure(s) Performed: VIDEO BRONCHOSCOPY WITH ENDOBRONCHIAL NAVIGATION (N/A ) BRONCHIAL BRUSHINGS BRONCHIAL NEEDLE ASPIRATION BIOPSIES BRONCHIAL BIOPSIES BRONCHIAL WASHINGS  Patient Location: PACU  Anesthesia Type:General  Level of Consciousness: awake, oriented and patient cooperative  Airway & Oxygen Therapy: Patient Spontanous Breathing and Patient connected to nasal cannula oxygen  Post-op Assessment: Report given to RN and Post -op Vital signs reviewed and stable  Post vital signs: Reviewed  Last Vitals:  Vitals Value Taken Time  BP 132/69 02/18/20 0955  Temp 37.1 C 02/18/20 0955  Pulse 77 02/18/20 0957  Resp 21 02/18/20 0957  SpO2 98 % 02/18/20 0957  Vitals shown include unvalidated device data.  Last Pain:  Vitals:   02/18/20 0955  TempSrc:   PainSc: (P) 0-No pain      Patients Stated Pain Goal: 3 (05/39/76 7341)  Complications: No complications documented.

## 2020-02-18 NOTE — Discharge Instructions (Signed)
Flexible Bronchoscopy, Care After This sheet gives you information about how to care for yourself after your test. Your doctor may also give you more specific instructions. If you have problems or questions, contact your doctor. Follow these instructions at home: Eating and drinking  Do not eat or drink anything (not even water) for 2 hours after your test, or until your numbing medicine (local anesthetic) wears off.  When your numbness is gone and your cough and gag reflexes have come back, you may: ? Eat only soft foods. ? Slowly drink liquids.  The day after the test, go back to your normal diet. Driving  Do not drive for 24 hours if you were given a medicine to help you relax (sedative).  Do not drive or use heavy machinery while taking prescription pain medicine. General instructions   Take over-the-counter and prescription medicines only as told by your doctor.  Return to your normal activities as told. Ask what activities are safe for you.  Do not use any products that have nicotine or tobacco in them. This includes cigarettes and e-cigarettes. If you need help quitting, ask your doctor.  Keep all follow-up visits as told by your doctor. This is important. It is very important if you had a tissue sample (biopsy) taken. Get help right away if:  You have shortness of breath that gets worse.  You get light-headed.  You feel like you are going to pass out (faint).  You have chest pain.  You cough up: ? More than a little blood. ? More blood than before. Summary  Do not eat or drink anything (not even water) for 2 hours after your test, or until your numbing medicine wears off.  Do not use cigarettes. Do not use e-cigarettes.  Get help right away if you have chest pain.   Please call our office for any questions or concerns. [phone].  You may restart your Plavix on 02/20/2020  This information is not intended to replace advice given to you by your health care  provider. Make sure you discuss any questions you have with your health care provider. Document Revised: 06/14/2017 Document Reviewed: 07/20/2016 Elsevier Patient Education  2020 [employer].

## 2020-02-18 NOTE — Op Note (Signed)
Video Bronchoscopy with Electromagnetic Navigation Procedure Note  Date of Operation: 02/18/2020  Pre-op Diagnosis: Bilateral pulmonary nodules  Post-op Diagnosis: Same  Surgeon: Baltazar Apo  Assistants: None  Anesthesia: General endotracheal anesthesia  Operation: Flexible video fiberoptic bronchoscopy with electromagnetic navigation and biopsies.  Estimated Blood Loss: Minimal  Complications: None apparent  Indications and History: Robert Tran is a 79 y.o. male with tobacco use found to have bilateral spiculated pulmonary nodules on CT scan of the chest suspicious for malignancy. Recommendation was made to achieve a tissue diagnosis via navigational bronchoscopy with biopsies.  The risks, benefits, complications, treatment options and expected outcomes were discussed with the patient.  The possibilities of pneumothorax, pneumonia, reaction to medication, pulmonary aspiration, perforation of a viscus, bleeding, failure to diagnose a condition and creating a complication requiring transfusion or operation were discussed with the patient who freely signed the consent.    Description of Procedure: The patient was seen in the Preoperative Area, was examined and was deemed appropriate to proceed.  The patient was taken to endoscopy room 2, identified as Robert Tran and the procedure verified as Flexible Video Fiberoptic Bronchoscopy.  A Time Out was held and the above information confirmed.   Prior to the date of the procedure a high-resolution CT scan of the chest was performed. Utilizing [place] a virtual tracheobronchial tree was generated to allow the creation of distinct navigation pathways to the patient's parenchymal abnormalities. After being taken to the operating room general anesthesia was initiated and the patient  was orally intubated. The video fiberoptic bronchoscope was introduced via the endotracheal tube and a general inspection was performed which showed some  edema and mild narrowing of the right upper lobe airways. Otherwise exam was completely normal. There were no endobronchial lesions or abnormal secretions. The extendable working channel and locator guide were introduced into the bronchoscope. The distinct navigation pathways prepared prior to this procedure were then utilized to navigate to within 0.4-0.8 cm of patient's lesions identified on CT scan. Target 1 larger of the 2 right upper lobe nodules. Target 3 lingular nodule. The extendable working channel was secured into place and the locator guide was withdrawn. Under fluoroscopic guidance transbronchial needle brushings, transbronchial Wang needle biopsies, and transbronchial forceps biopsies were performed at both target 1 and target 3 to be sent for cytology and pathology. A bronchioalveolar lavage was performed in the lingula adjacent to target 3 and sent for cytology and microbiology (bacterial, fungal, AFB smears and cultures). At the end of the procedure a general airway inspection was performed and there was no evidence of active bleeding. The bronchoscope was removed.  The patient tolerated the procedure well. There was no significant blood loss and there were no obvious complications. A post-procedural chest x-ray is pending.  Samples: 1. Transbronchial needle brushings from target 1, right upper lobe nodule 2. Transbronchial Wang needle biopsies from target 1, right upper lobe nodule 3. Transbronchial forceps biopsies from target 1, right upper lobe nodule 4. Transbronchial needle brushings from target 3, lingular nodule 5. Transbronchial Wang needle biopsies from target 3, lingular nodule 6. Transbronchial forceps biopsies from target 3, lingular nodule 7. Bronchoalveolar lavage from lingula  Plans:  The patient will be discharged from the PACU to home when recovered from anesthesia and after chest x-ray is reviewed. We will review the cytology, pathology and microbiology results with  the patient when they become available. Outpatient followup will be with Dr Lamonte Sakai and Dr Katheran Awe.    Baltazar Apo, MD,  PhD 02/18/2020, 9:42 AM  Pulmonary and Critical Care [phone] or if no answer [phone]

## 2020-02-18 NOTE — Anesthesia Postprocedure Evaluation (Signed)
Anesthesia Post Note  Patient: Robert Tran  Procedure(s) Performed: VIDEO BRONCHOSCOPY WITH ENDOBRONCHIAL NAVIGATION (N/A ) BRONCHIAL BRUSHINGS BRONCHIAL NEEDLE ASPIRATION BIOPSIES BRONCHIAL BIOPSIES BRONCHIAL WASHINGS     Patient location during evaluation: PACU Anesthesia Type: General Level of consciousness: awake and alert and oriented Pain management: pain level controlled Vital Signs Assessment: post-procedure vital signs reviewed and stable Respiratory status: spontaneous breathing, nonlabored ventilation and respiratory function stable Cardiovascular status: blood pressure returned to baseline and stable Postop Assessment: no apparent nausea or vomiting Anesthetic complications: no   No complications documented.  Last Vitals:  Vitals:   02/18/20 0955 02/18/20 1010  BP: 132/69 113/74  Pulse: 81 77  Resp: 19 16  Temp: 37.1 C   SpO2: 96% 93%    Last Pain:  Vitals:   02/18/20 0955  TempSrc:   PainSc: 0-No pain                 Kariya Lavergne A.

## 2020-02-19 ENCOUNTER — Telehealth: Payer: Self-pay | Admitting: Emergency Medicine

## 2020-02-19 ENCOUNTER — Encounter (HOSPITAL_COMMUNITY): Payer: Self-pay | Admitting: Emergency Medicine

## 2020-02-19 LAB — SURGICAL PATHOLOGY

## 2020-02-19 LAB — CYTOLOGY - NON PAP

## 2020-02-19 NOTE — Telephone Encounter (Signed)
Call patient regarding bronchoscopy results when available

## 2020-02-23 NOTE — Telephone Encounter (Signed)
Called patient, no answer, left message VM.

## 2020-02-23 NOTE — Telephone Encounter (Signed)
I spoke with the patient and reviewed his results.  The largest right upper lobe nodule shows adenocarcinoma, and presumably the adjacent nodule is the same.  The left upper lobe nodule brushings and biopsies were negative for malignant cells.  Our plan is to forward the results to Dr. Marin Olp and defer to him next steps in treatment, either therapy as outlined by Dr. Marin Olp or referral to thoracic oncology.

## 2020-02-24 ENCOUNTER — Telehealth: Payer: Self-pay

## 2020-02-24 NOTE — Telephone Encounter (Signed)
Received VM from pt's wife stating that pt had bronchoscopy last week and had been doing fine, but "just started coughing up blood." Helene Kelp states they are on their way to the ED.  Before this RN could follow up on this phone call, pt and spouse present to our office. Advised by Donita Brooks, scheduler to go to ED as planned and to notify pulmonologist of changes. Verbalized understanding. dph

## 2020-02-25 ENCOUNTER — Other Ambulatory Visit: Payer: Self-pay | Admitting: *Deleted

## 2020-02-25 NOTE — Progress Notes (Signed)
The proposed treatment discussed in cancer conference 02/25/20 is for discussion purpose only and is not a binding recommendation.  The patient was not physically examined nor present for their treatment options.  Therefore, final treatment plans cannot be decided.

## 2020-02-29 ENCOUNTER — Telehealth: Payer: Self-pay | Admitting: Hematology & Oncology

## 2020-02-29 ENCOUNTER — Encounter: Payer: Self-pay | Admitting: Hematology & Oncology

## 2020-02-29 NOTE — Telephone Encounter (Signed)
Left a phone message for Robert Tran.  I told him that the pathology came back as lung cancer.  This is adenocarcinoma.  The area in the left lung was benign.  I think the options at this point are surgical resection.  I spoke with one of our thoracic surgeons who says that these nodules could be resected out.  I know this would be quite aggressive and would be truly invasive.  However, this would be the best way and to try to cure this problem.  The other option would be radiosurgery.  I think that the tumor is certainly small enough that radiosurgery could be considered.  I told him to give the office a call to make sure that he got this message.  I would like to hear from him so we can see what might be reasonable for him to do.  Lattie Haw, MD

## 2020-03-02 ENCOUNTER — Other Ambulatory Visit: Payer: Self-pay | Admitting: *Deleted

## 2020-03-02 DIAGNOSIS — D45 Polycythemia vera: Secondary | ICD-10-CM

## 2020-03-03 ENCOUNTER — Encounter: Payer: Self-pay | Admitting: Hematology & Oncology

## 2020-03-03 ENCOUNTER — Inpatient Hospital Stay (HOSPITAL_BASED_OUTPATIENT_CLINIC_OR_DEPARTMENT_OTHER): Payer: Medicare Other | Admitting: Hematology & Oncology

## 2020-03-03 ENCOUNTER — Telehealth: Payer: Self-pay | Admitting: Hematology & Oncology

## 2020-03-03 ENCOUNTER — Inpatient Hospital Stay: Payer: Medicare Other | Attending: Hematology & Oncology

## 2020-03-03 ENCOUNTER — Other Ambulatory Visit: Payer: Self-pay

## 2020-03-03 VITALS — BP 150/66 | HR 86 | Temp 97.6°F | Resp 18 | Wt 170.2 lb

## 2020-03-03 DIAGNOSIS — Z7982 Long term (current) use of aspirin: Secondary | ICD-10-CM | POA: Insufficient documentation

## 2020-03-03 DIAGNOSIS — Z79899 Other long term (current) drug therapy: Secondary | ICD-10-CM | POA: Insufficient documentation

## 2020-03-03 DIAGNOSIS — Z8673 Personal history of transient ischemic attack (TIA), and cerebral infarction without residual deficits: Secondary | ICD-10-CM | POA: Diagnosis not present

## 2020-03-03 DIAGNOSIS — Z7902 Long term (current) use of antithrombotics/antiplatelets: Secondary | ICD-10-CM | POA: Diagnosis not present

## 2020-03-03 DIAGNOSIS — Z7984 Long term (current) use of oral hypoglycemic drugs: Secondary | ICD-10-CM | POA: Insufficient documentation

## 2020-03-03 DIAGNOSIS — D45 Polycythemia vera: Secondary | ICD-10-CM | POA: Diagnosis present

## 2020-03-03 DIAGNOSIS — C3411 Malignant neoplasm of upper lobe, right bronchus or lung: Secondary | ICD-10-CM | POA: Insufficient documentation

## 2020-03-03 LAB — CBC WITH DIFFERENTIAL (CANCER CENTER ONLY)
Abs Immature Granulocytes: 0.08 10*3/uL — ABNORMAL HIGH (ref 0.00–0.07)
Basophils Absolute: 0.1 10*3/uL (ref 0.0–0.1)
Basophils Relative: 1 %
Eosinophils Absolute: 0.4 10*3/uL (ref 0.0–0.5)
Eosinophils Relative: 4 %
HCT: 45.4 % (ref 39.0–52.0)
Hemoglobin: 14.6 g/dL (ref 13.0–17.0)
Immature Granulocytes: 1 %
Lymphocytes Relative: 25 %
Lymphs Abs: 2.5 10*3/uL (ref 0.7–4.0)
MCH: 28.7 pg (ref 26.0–34.0)
MCHC: 32.2 g/dL (ref 30.0–36.0)
MCV: 89.4 fL (ref 80.0–100.0)
Monocytes Absolute: 0.7 10*3/uL (ref 0.1–1.0)
Monocytes Relative: 7 %
Neutro Abs: 6.3 10*3/uL (ref 1.7–7.7)
Neutrophils Relative %: 62 %
Platelet Count: 273 10*3/uL (ref 150–400)
RBC: 5.08 MIL/uL (ref 4.22–5.81)
RDW: 14.8 % (ref 11.5–15.5)
WBC Count: 10 10*3/uL (ref 4.0–10.5)
nRBC: 0 % (ref 0.0–0.2)

## 2020-03-03 LAB — CMP (CANCER CENTER ONLY)
ALT: 13 U/L (ref 0–44)
AST: 10 U/L — ABNORMAL LOW (ref 15–41)
Albumin: 4.2 g/dL (ref 3.5–5.0)
Alkaline Phosphatase: 65 U/L (ref 38–126)
Anion gap: 5 (ref 5–15)
BUN: 27 mg/dL — ABNORMAL HIGH (ref 8–23)
CO2: 26 mmol/L (ref 22–32)
Calcium: 10.6 mg/dL — ABNORMAL HIGH (ref 8.9–10.3)
Chloride: 106 mmol/L (ref 98–111)
Creatinine: 1.19 mg/dL (ref 0.61–1.24)
GFR, Est AFR Am: >60 mL/min (ref >60)
GFR, Estimated: 58 mL/min — ABNORMAL LOW (ref >60)
Glucose, Bld: 227 mg/dL — ABNORMAL HIGH (ref 70–99)
Potassium: 4.6 mmol/L (ref 3.5–5.1)
Sodium: 137 mmol/L (ref 135–145)
Total Bilirubin: 0.4 mg/dL (ref 0.3–1.2)
Total Protein: 6.6 g/dL (ref 6.5–8.1)

## 2020-03-03 NOTE — Progress Notes (Signed)
Hematology and Oncology Follow Up Visit  Robert Tran [id number] [birth year] 79 y.o. 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  We made a diagnosis of adenocarcinoma.  He underwent bronchoscopy.  At this is done on 02/17/2020.  The pathology report ([id number]) shows malignant cells consistent with non-small cell carcinoma.  It was felt to be adenocarcinoma.  Unfortunately, there is none of material to send off for genetic and molecular markers.  I really had to thank Robert Tran for the excellent technical skills.  I know this was not an easy procedure.  He had little bit of hemoptysis afterwards.  He feels well.  He has had no problems with cough or shortness of breath.  Of note, the left lung nodule was biopsied and this was found to be benign.  I think that the best way for [place] to try to treat these synchronous lung cancers is via stereotactic radiosurgery.  I know that he could probably have a lobectomy to remove these lung cancers.  However, I think that he would have significant side effects and a tough recovery from lung surgery.  I talked to he and his wife.  I explained the different recommendations that we would have.  Again I do think that he would do well with stereotactic radiosurgery.  Given that he has had cerebrovascular disease, and that he is on Plavix, I do think it would be tough and more tenuous to have an invasive procedure such as a VATS.  Currently, I would have to say that his performance status is ECOG 1.     Medications:  Allergies as of 03/03/2020      Reactions   Pravastatin Sodium Other (See Comments)   Patient stated,"it messed up my back and legs, I couldn't walk."   Minocycline Nausea Only   Sulfa  Antibiotics Nausea Only   Sulfa Drugs Cross Reactors Nausea Only      Medication List       Accurate as of March 03, 2020  1:30 PM. If you have any questions, ask your nurse or doctor.        acetaminophen 500 MG tablet Commonly known as: TYLENOL Take 1,000 mg by mouth every 6 (six) hours as needed for moderate pain or headache.   albuterol 108 (90 Base) MCG/ACT inhaler Commonly known as: VENTOLIN HFA Inhale 2 puffs into the lungs every 6 (six) hours as needed for wheezing or shortness of breath.   amLODipine 10 MG tablet Commonly known as: NORVASC Take 10 mg by mouth daily.   APPLE CIDER VINEGAR PO Take 15 mLs by mouth daily.   aspirin EC 81 MG tablet Take 81 mg by mouth daily.   atorvastatin 80 MG tablet Commonly known as: LIPITOR Take 40 mg by mouth at bedtime.   clopidogrel 75 MG tablet Commonly known as: Plavix Take 1 tablet (75 mg total) by mouth daily. Okay to restart this medication on 02/20/2020.   fluocinonide 0.05 % external solution Commonly known as: LIDEX Apply 1 application topically every other day.   folic acid 939 MCG tablet Commonly known as: FOLVITE Take 400 mcg by mouth every morning.   glimepiride 1 MG tablet Commonly known as: AMARYL Take 1 mg by mouth every morning.   metFORMIN  500 MG 24 hr tablet Commonly known as: GLUCOPHAGE-XR Take 1,000 mg by mouth at bedtime.   metoprolol succinate 25 MG 24 hr tablet Commonly known as: TOPROL-XL Take 25 mg by mouth daily.   oxyCODONE 5 MG immediate release tablet Commonly known as: Oxy IR/ROXICODONE Take 5 mg by mouth every 8 (eight) hours as needed for severe pain.   ramipril 10 MG tablet Commonly known as: ALTACE Take 10 mg by mouth daily.   tamsulosin 0.4 MG Caps capsule Commonly known as: FLOMAX Take 0.4 mg by mouth daily as needed (kidney stones).   Vitamin D 125 MCG (5000 UT) Caps Take 5,000 Units by mouth daily.       Allergies:  Allergies  Allergen Reactions  .  Pravastatin Sodium Other (See Comments)    Patient stated,"it messed up my back and legs, I couldn't walk."  . Minocycline Nausea Only  . Sulfa Antibiotics Nausea Only  . Sulfa Drugs Cross Reactors Nausea Only    Past Medical History, Surgical history, Social history, and Family History were reviewed and updated.  Review of Systems: Review of Systems  Constitutional: Negative.   HENT: Negative.   Eyes: Negative.   Respiratory: Negative.   Cardiovascular: Negative.   Gastrointestinal: Negative.   Genitourinary: Negative.   Musculoskeletal: Negative.   Skin: Negative.   Neurological: Negative.   Endo/Heme/Allergies: Negative.   Psychiatric/Behavioral: Negative.     Physical Exam:  weight is 170 lb 4 oz (77.2 kg). His oral temperature is 97.6 F (36.4 C). His blood pressure is 150/66 (abnormal) and his pulse is 86. His respiration is 18 and oxygen saturation is 100%.   Wt Readings from Last 3 Encounters:  03/03/20 170 lb 4 oz (77.2 kg)  02/18/20 168 lb (76.2 kg)  02/12/20 169 lb (76.7 kg)    Physical Exam Vitals reviewed.  HENT:     Head: Normocephalic and atraumatic.  Eyes:     Pupils: Pupils are equal, round, and reactive to light.  Cardiovascular:     Rate and Rhythm: Normal rate and regular rhythm.     Heart sounds: Normal heart sounds.  Pulmonary:     Effort: Pulmonary effort is normal.     Breath sounds: Normal breath sounds.  Abdominal:     General: Bowel sounds are normal.     Palpations: Abdomen is soft.  Musculoskeletal:        General: No tenderness or deformity. Normal range of motion.     Cervical back: Normal range of motion.  Lymphadenopathy:     Cervical: No cervical adenopathy.  Skin:    General: Skin is warm and dry.     Findings: No erythema or rash.  Neurological:     Mental Status: He is alert and oriented to person, place, and time.  Psychiatric:        Behavior: Behavior normal.        Thought Content: Thought content normal.         Judgment: Judgment normal.      Lab Results  Component Value Date   WBC 10.0 03/03/2020   HGB 14.6 03/03/2020   HCT 45.4 03/03/2020   MCV 89.4 03/03/2020   PLT 273 03/03/2020   Lab Results  Component Value Date   FERRITIN 12 (L) 01/21/2020   IRON 46 01/21/2020   TIBC 373 01/21/2020   UIBC 326 01/21/2020   IRONPCTSAT 12 (L) 01/21/2020   Lab Results  Component Value Date   RETICCTPCT 2.0 12/07/2019  RBC 5.08 03/03/2020   RETICCTABS 77.3 06/20/2015   No results found for: KPAFRELGTCHN, LAMBDASER, KAPLAMBRATIO No results found for: IGGSERUM, IGA, IGMSERUM No results found for: [name], SPEI   Chemistry      Component Value Date/Time   NA 137 03/03/2020 1051   NA 142 07/18/2017 1421   NA 134 (L) 08/09/2016 1313   K 4.6 03/03/2020 1051   K 4.0 07/18/2017 1421   K 4.2 08/09/2016 1313   CL 106 03/03/2020 1051   CL 103 07/18/2017 1421   CO2 26 03/03/2020 1051   CO2 29 07/18/2017 1421   CO2 20 (L) 08/09/2016 1313   BUN 27 (H) 03/03/2020 1051   BUN 19 07/18/2017 1421   BUN 19.9 08/09/2016 1313   CREATININE 1.19 03/03/2020 1051   CREATININE 1.4 (H) 07/18/2017 1421   CREATININE 1.3 08/09/2016 1313      Component Value Date/Time   CALCIUM 10.6 (H) 03/03/2020 1051   CALCIUM 10.2 07/18/2017 1421   CALCIUM 10.0 08/09/2016 1313   ALKPHOS 65 03/03/2020 1051   ALKPHOS 65 07/18/2017 1421   ALKPHOS 75 08/09/2016 1313   AST 10 (L) 03/03/2020 1051   AST 14 08/09/2016 1313   ALT 13 03/03/2020 1051   ALT 21 07/18/2017 1421   ALT 23 08/09/2016 1313   BILITOT 0.4 03/03/2020 1051   BILITOT 0.34 08/09/2016 1313      Impression and Plan: Mr. Santillanes is a 79 year old male. He has polycythemia. 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 However, phlebotomizing him definitely makes him feel better.  We have a confirmed diagnosis of non-small cell lung cancer.  This is adenocarcinoma.  He is still smoking.  He does have  polycythemia.  This is secondary to his high blood pressure, caffeine use, and probably tobacco use.  Again we will speak with Radiation Oncology.  We will see if they can evaluate Mr. Evitts for stereotactic radiosurgery.  I would like to see him back probably in about a month or so.  I figure that if he has radiosurgery, this will be in early September.  We probably would not do another CT scan for about 2-3 months after his radiosurgery.  I spent about 40-45 minutes with Mr. Fiallos and his wife.  I have known him for many years.  I just want to make sure that he has the best treatment that will allow him to have a good quality of life which is very important for him.       Volanda Napoleon, MD 8/19/20211:30 PM

## 2020-03-03 NOTE — Telephone Encounter (Signed)
Appointments scheduled calendar printed per 8/19 los

## 2020-03-04 DIAGNOSIS — C3490 Malignant neoplasm of unspecified part of unspecified bronchus or lung: Secondary | ICD-10-CM | POA: Insufficient documentation

## 2020-03-08 ENCOUNTER — Inpatient Hospital Stay: Payer: Medicare Other

## 2020-03-08 ENCOUNTER — Inpatient Hospital Stay: Payer: Medicare Other | Admitting: Hematology & Oncology

## 2020-03-08 ENCOUNTER — Ambulatory Visit: Payer: TRICARE For Life (TFL) | Admitting: Hematology & Oncology

## 2020-03-08 ENCOUNTER — Other Ambulatory Visit: Payer: TRICARE For Life (TFL)

## 2020-03-29 NOTE — Progress Notes (Signed)
Thoracic Location of Tumor / Histology: Right Upper lung  Patient presented with a cough and found to have lung nodules  Biopsies of lung (if applicable) revealed: 78/93/8101   Tobacco/Marijuana/Snuff/ETOH use: quit smoking 03/26/2020 1 pack a day for 68 years, alcohol use No  Past/Anticipated interventions by cardiothoracic surgery, if any: Dr. Lamonte Sakai Bronchial biopsies  Past/Anticipated interventions by medical oncology, if any: polycythemia diagnosis with phlebotomies as needed.    Signs/Symptoms  Weight changes, if any: no  Respiratory complaints, if any: no  Hemoptysis, if any: no  Pain issues, if any: no  SAFETY ISSUES:  Prior radiation? no  Pacemaker/ICD? no  Possible current pregnancy?n/a  Is the patient on methotrexate? no  Current Complaints / other details:  Accompanied by Helene Kelp his wife.

## 2020-03-29 NOTE — Progress Notes (Signed)
Radiation Oncology         [phone] ________________________________  Initial Outpatient Consultation  Name: [name] MRN: [id number]  Date: 03/30/2020  DOB: 04-20-1941  GB:TDVVO, Robert Griffes, MD  Robert Napoleon, MD   REFERRING PHYSICIAN: Volanda Napoleon, MD  DIAGNOSIS: The encounter diagnosis was Primary cancer of right upper lobe of lung [facility]).   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 male who is seen as a courtesy of Dr. Marin Olp for an opinion concerning radiation therapy as part of management for his recently diagnosed lung cancer. Today, he is accompanied by his wife. The patient was seen in consultation with Dr. Beatrix Fetters, cardiologist, at [facility] on 10/13/2019 for having refused statin therapy. Following this, the patient underwent a complete cardiac work-up, which included an echocardiogram (EF of 55-60%), stress test, cardiac catheterization, and chest x-ray that happened to be abnormal. The patient then underwent a chest CT scan on 01/20/2020 that showed two spiculated densities noted in the right upper lobe, the largest of which measured 3.1 x 1.9 cm. There was also noted to be a 16 x 10 mm spiculated density in the lingular segment of the left upper lobe that was concerning for malignancy or metastatic disease. Additionally, there was a focal bulge involving the visualized portion of the mid pole of the left kidney that was concerning for mass or neoplasm. Finally, there were moderate coronary artery calficiations that suggested coronary artery disease.  The patient was seen by Dr. Marin Olp on 01/21/2020 regarding the above findings. Of note, he has been followed by Dr. 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 He remains under treatment with phlebotomy to maintain hematocrit below 45%. Given the new findings, Dr. Marin Olp recommended that the patient undergo a PET scan and  biopsy.  PET scan on 02/01/2020 showed two FDG avid lung nodules within the right upper lobe that were concerning for malignancy. A third, smaller nodule was identified within the lingula and was also mildly FDG avid and suspicious for additional site of disease. There were no findings of FDG avid nodal metastases or metastatic disease to the abdomen or pelvis.   The patient was seen in consultation with Dr. Lamonte Sakai on 02/12/2020, during which time it was recommended that he proceed with bronchoscopy. Video bronchoscopy with electromagnetic navigation was performed on 02/18/2020 and revealed malignant cells consistent with non-small cell carcinoma of the right upper lobe fine needle aspiration. The morphology favored adenocarcinoma. Right upper lobe brushing and left upper lobe/lingula brushing and fine needle aspiration showed benign reactive/reparative changes.  The patient's case was discussed at the thoracic oncology conference on 02/25/2020, during which time it was recommended that the patient proceed with either surgical resection or radiosurgery.   The patient was last seen by Dr. Marin Olp on 03/03/2020. At that time, they discussed stereotactic radiosurgery versus lobectomy to remove the cancers. It was recommended that he proceed with stereotactic radiosurgery because of the possible significant side effects and tough recovery from surgery.  PREVIOUS RADIATION THERAPY: No  PAST MEDICAL HISTORY:  Past Medical History:  Diagnosis Date  . Chronic bronchitis ([place])   . Chronic kidney disease    stage 3  . Coronary artery disease   . Diabetes mellitus without complication ([place])    type 2  . Diverticulosis of intestine with bleeding 07/19/2017  . Diverticulosis of large intestine with hemorrhage 07/19/2017  . Dyspnea   . History of kidney  stones    passed stones and lithotripsy  . HLD (hyperlipidemia)   . Hypertension   . Iron deficiency anemia 07/19/2017  . Melanoma ([place])    on back     PAST SURGICAL HISTORY: Past Surgical History:  Procedure Laterality Date  . BRONCHIAL BIOPSY  02/18/2020   Procedure: BRONCHIAL BIOPSIES;  Surgeon: Collene Gobble, MD;  Location: [facility] ENDOSCOPY;  Service: Pulmonary;;  . BRONCHIAL BRUSHINGS  02/18/2020   Procedure: BRONCHIAL BRUSHINGS;  Surgeon: Collene Gobble, MD;  Location: [facility] ENDOSCOPY;  Service: Pulmonary;;  . BRONCHIAL NEEDLE ASPIRATION BIOPSY  02/18/2020   Procedure: BRONCHIAL NEEDLE ASPIRATION BIOPSIES;  Surgeon: Collene Gobble, MD;  Location: [facility] ENDOSCOPY;  Service: Pulmonary;;  . BRONCHIAL WASHINGS  02/18/2020   Procedure: BRONCHIAL WASHINGS;  Surgeon: Collene Gobble, MD;  Location: [facility] ENDOSCOPY;  Service: Pulmonary;;  . COLONOSCOPY  2019  . EYE SURGERY     bilateral cateracts  . LITHOTRIPSY    . SKIN CANCER EXCISION     on back  . VIDEO BRONCHOSCOPY WITH ENDOBRONCHIAL NAVIGATION N/A 02/18/2020   Procedure: VIDEO BRONCHOSCOPY WITH ENDOBRONCHIAL NAVIGATION;  Surgeon: Collene Gobble, MD;  Location: [place] ENDOSCOPY;  Service: Pulmonary;  Laterality: N/A;    FAMILY HISTORY: History reviewed. No pertinent family history.  SOCIAL HISTORY:  Social History   Tobacco Use  . Smoking status: Current Every Day Smoker    Packs/day: 1.50    Years: 63.00    Pack years: 94.50    Types: Cigarettes    Start date: 07/06/1941  . Smokeless tobacco: Never Used  . Tobacco comment: cut back to 1/2 ppd  Vaping Use  . Vaping Use: Never used  Substance Use Topics  . Alcohol use: Yes    Alcohol/week: 2.0 standard drinks    Types: 2 Shots of liquor per week    Comment: 1/5 once a month   . Drug use: Never    ALLERGIES:  Allergies  Allergen Reactions  . Pravastatin Sodium Other (See Comments)    Patient stated,"it messed up my back and legs, I couldn't walk."  . Minocycline Nausea Only  . Sulfa Antibiotics Nausea Only  . Sulfa Drugs Cross Reactors Nausea Only    MEDICATIONS:  Current Outpatient Medications  Medication Sig Dispense  Refill  . acetaminophen (TYLENOL) 500 MG tablet Take 1,000 mg by mouth every 6 (six) hours as needed for moderate pain or headache.    . albuterol (PROVENTIL HFA;VENTOLIN HFA) 108 (90 Base) MCG/ACT inhaler Inhale 2 puffs into the lungs every 6 (six) hours as needed for wheezing or shortness of breath.     Marland Kitchen amLODipine (NORVASC) 10 MG tablet Take 10 mg by mouth daily.    . APPLE CIDER VINEGAR PO Take 15 mLs by mouth daily.    Marland Kitchen aspirin EC 81 MG tablet Take 81 mg by mouth daily.    Marland Kitchen atorvastatin (LIPITOR) 80 MG tablet Take 40 mg by mouth at bedtime.     . Cholecalciferol (VITAMIN D) 125 MCG (5000 UT) CAPS Take 5,000 Units by mouth daily.     . clopidogrel (PLAVIX) 75 MG tablet Take 1 tablet (75 mg total) by mouth daily. Okay to restart this medication on 02/20/2020.    . fluocinonide (LIDEX) 0.05 % external solution Apply 1 application topically every other day.     . folic acid (FOLVITE) 829 MCG tablet Take 400 mcg by mouth every morning.     Marland Kitchen glimepiride (AMARYL) 1 MG tablet Take 1  mg by mouth every morning.    . metFORMIN (GLUCOPHAGE-XR) 500 MG 24 hr tablet Take 1,000 mg by mouth at bedtime.     . metoprolol succinate (TOPROL-XL) 25 MG 24 hr tablet Take 25 mg by mouth daily.    Marland Kitchen oxyCODONE (OXY IR/ROXICODONE) 5 MG immediate release tablet Take 5 mg by mouth every 8 (eight) hours as needed for severe pain.     . ramipril (ALTACE) 10 MG tablet Take 10 mg by mouth daily.    . tamsulosin (FLOMAX) 0.4 MG CAPS capsule Take 0.4 mg by mouth daily as needed (kidney stones).     No current facility-administered medications for this encounter.    REVIEW OF SYSTEMS:  A 10+ POINT REVIEW OF SYSTEMS WAS OBTAINED including neurology, dermatology, psychiatry, cardiac, respiratory, lymph, extremities, GI, GU, musculoskeletal, constitutional, reproductive, HEENT.  He denies any breathing problems or pain within the chest area or hemoptysis.  He denies any visual problems or headaches.   PHYSICAL EXAM:  height  is 5\' 5"  (1.651 m) and weight is 169 lb (76.7 kg). His oral temperature is 97.8 F (36.6 C). His blood pressure is 144/81 (abnormal) and his pulse is 62. His respiration is 18 and oxygen saturation is 99%.   General: Alert and oriented, in no acute distress HEENT: Head is normocephalic. Extraocular movements are intact. Oropharynx is clear. Neck: Neck is supple, no palpable cervical or supraclavicular lymphadenopathy. Heart: Regular in rate and rhythm with no murmurs, rubs, or gallops. Chest: Clear to auscultation bilaterally, with no rhonchi, wheezes, or rales. Abdomen: Soft, nontender, nondistended, with no rigidity or guarding. Extremities: No cyanosis or edema. Lymphatics: see Neck Exam Skin: No concerning lesions. Musculoskeletal: symmetric strength and muscle tone throughout. Neurologic: Cranial nerves II through XII are grossly intact. No obvious focalities. Speech is fluent. Coordination is intact. Psychiatric: Judgment and insight are intact. Affect is appropriate.   ECOG = 1  0 - Asymptomatic (Fully active, able to carry on all predisease activities without restriction)  1 - Symptomatic but completely ambulatory (Restricted in physically strenuous activity but ambulatory and able to carry out work of a light or sedentary nature. For example, light housework, office work)  2 - Symptomatic, <50% in bed during the day (Ambulatory and capable of all self care but unable to carry out any work activities. Up and about more than 50% of waking hours)  3 - Symptomatic, >50% in bed, but not bedbound (Capable of only limited self-care, confined to bed or chair 50% or more of waking hours)  4 - Bedbound (Completely disabled. Cannot carry on any self-care. Totally confined to bed or chair)  5 - Death   Eustace Pen MM, Creech RH, Tormey DC, et al. [phone]). "Toxicity and response criteria of the [facility]". [place] Oncol. 5 (6): 649-55  LABORATORY DATA:  Lab Results   Component Value Date   WBC 10.0 03/03/2020   HGB 14.6 03/03/2020   HCT 45.4 03/03/2020   MCV 89.4 03/03/2020   PLT 273 03/03/2020   NEUTROABS 6.3 03/03/2020   Lab Results  Component Value Date   NA 137 03/03/2020   K 4.6 03/03/2020   CL 106 03/03/2020   CO2 26 03/03/2020   GLUCOSE 227 (H) 03/03/2020   CREATININE 1.19 03/03/2020   CALCIUM 10.6 (H) 03/03/2020      RADIOGRAPHY: No results found.    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  The activity noted in the lingula of the left lung is minimal and biopsy of this area showed no evidence of malignancy.  In light of this we will hold off on treatments to this region but will follow closely with serial CT scans or PET scans.  Today, I talked to the patient and his wife about the findings and work-up thus far.  We discussed the natural history of adenocarcinoma and general treatment, highlighting the role of radiotherapy (SBRT) in the management.  We discussed the available radiation techniques, and focused on the details of logistics and delivery.  We reviewed the anticipated acute and late sequelae associated with radiation in this setting.  The patient was encouraged to ask questions that I answered to the best of my ability.  A patient consent form was discussed and signed.  We retained a copy for our records.  The patient would like to proceed with radiation and will be scheduled for CT simulation.  PLAN: The patient will return early next week for CT simulation with planning for SBRT directed at his 2 lesions in the right upper lung region.  Anticipate between 3 and 5 SBRT treatments.  Total time spent in this encounter was 65 minutes which included reviewing the patient's most recent consultations, follow-ups, chest CT scan, PET scan, bronchoscopy,  pathology report, physical examination, and documentation.  ------------------------------------------------  Blair Promise, PhD, MD  This document serves as a record of services personally performed by Gery Pray, MD. It was created on his behalf by Clerance Lav, a trained medical scribe. The creation of this record is based on the scribe's personal observations and the provider's statements to them. This document has been checked and approved by the attending provider.

## 2020-03-30 ENCOUNTER — Encounter: Payer: Self-pay | Admitting: Radiation Oncology

## 2020-03-30 ENCOUNTER — Ambulatory Visit
Admission: RE | Admit: 2020-03-30 | Discharge: 2020-03-30 | Disposition: A | Discharge status: Home / Self Care | Payer: Medicare Other | Source: Ambulatory Visit | Attending: Radiation Oncology | Admitting: Radiation Oncology

## 2020-03-30 ENCOUNTER — Other Ambulatory Visit: Payer: Self-pay

## 2020-03-30 DIAGNOSIS — I2510 Atherosclerotic heart disease of native coronary artery without angina pectoris: Secondary | ICD-10-CM | POA: Diagnosis not present

## 2020-03-30 DIAGNOSIS — Z87442 Personal history of urinary calculi: Secondary | ICD-10-CM | POA: Insufficient documentation

## 2020-03-30 DIAGNOSIS — F17210 Nicotine dependence, cigarettes, uncomplicated: Secondary | ICD-10-CM | POA: Insufficient documentation

## 2020-03-30 DIAGNOSIS — Z85820 Personal history of malignant melanoma of skin: Secondary | ICD-10-CM | POA: Diagnosis not present

## 2020-03-30 DIAGNOSIS — D509 Iron deficiency anemia, unspecified: Secondary | ICD-10-CM | POA: Insufficient documentation

## 2020-03-30 DIAGNOSIS — Z7984 Long term (current) use of oral hypoglycemic drugs: Secondary | ICD-10-CM | POA: Diagnosis not present

## 2020-03-30 DIAGNOSIS — E119 Type 2 diabetes mellitus without complications: Secondary | ICD-10-CM | POA: Diagnosis not present

## 2020-03-30 DIAGNOSIS — I129 Hypertensive chronic kidney disease with stage 1 through stage 4 chronic kidney disease, or unspecified chronic kidney disease: Secondary | ICD-10-CM | POA: Diagnosis not present

## 2020-03-30 DIAGNOSIS — R918 Other nonspecific abnormal finding of lung field: Secondary | ICD-10-CM

## 2020-03-30 DIAGNOSIS — N189 Chronic kidney disease, unspecified: Secondary | ICD-10-CM | POA: Diagnosis not present

## 2020-03-30 DIAGNOSIS — Z7982 Long term (current) use of aspirin: Secondary | ICD-10-CM | POA: Diagnosis not present

## 2020-03-30 DIAGNOSIS — Z79899 Other long term (current) drug therapy: Secondary | ICD-10-CM | POA: Diagnosis not present

## 2020-03-30 DIAGNOSIS — C3411 Malignant neoplasm of upper lobe, right bronchus or lung: Secondary | ICD-10-CM

## 2020-03-30 DIAGNOSIS — E785 Hyperlipidemia, unspecified: Secondary | ICD-10-CM | POA: Diagnosis not present

## 2020-04-05 ENCOUNTER — Other Ambulatory Visit: Payer: Self-pay

## 2020-04-05 ENCOUNTER — Ambulatory Visit
Admission: RE | Admit: 2020-04-05 | Discharge: 2020-04-05 | Disposition: A | Discharge status: Home / Self Care | Payer: Medicare Other | Source: Ambulatory Visit | Attending: Radiation Oncology | Admitting: Radiation Oncology

## 2020-04-05 DIAGNOSIS — Z510 Encounter for antineoplastic radiation therapy: Secondary | ICD-10-CM | POA: Diagnosis present

## 2020-04-05 DIAGNOSIS — C3411 Malignant neoplasm of upper lobe, right bronchus or lung: Secondary | ICD-10-CM | POA: Diagnosis present

## 2020-04-06 ENCOUNTER — Ambulatory Visit (INDEPENDENT_AMBULATORY_CARE_PROVIDER_SITE_OTHER): Payer: Medicare Other | Admitting: Emergency Medicine

## 2020-04-06 ENCOUNTER — Encounter: Payer: Self-pay | Admitting: Emergency Medicine

## 2020-04-06 ENCOUNTER — Other Ambulatory Visit: Payer: Self-pay

## 2020-04-06 VITALS — BP 124/68 | HR 77 | Temp 97.7°F | Ht 65.0 in | Wt 170.0 lb

## 2020-04-06 DIAGNOSIS — Z23 Encounter for immunization: Secondary | ICD-10-CM

## 2020-04-06 DIAGNOSIS — J449 Chronic obstructive pulmonary disease, unspecified: Secondary | ICD-10-CM

## 2020-04-06 DIAGNOSIS — C3411 Malignant neoplasm of upper lobe, right bronchus or lung: Secondary | ICD-10-CM

## 2020-04-06 DIAGNOSIS — Z720 Tobacco use: Secondary | ICD-10-CM

## 2020-04-06 DIAGNOSIS — R918 Other nonspecific abnormal finding of lung field: Secondary | ICD-10-CM | POA: Diagnosis not present

## 2020-04-06 LAB — PULMONARY FUNCTION TEST
DL/VA % pred: 78 %
DL/VA: 3.12 ml/min/mmHg/L
DLCO cor % pred: 75 %
DLCO cor: 15.7 ml/min/mmHg
DLCO unc % pred: 75 %
DLCO unc: 15.7 ml/min/mmHg
FEF 25-75 Post: 1.39 L/sec
FEF 25-75 Pre: 1.16 L/sec
FEF2575-%Change-Post: 20 %
FEF2575-%Pred-Post: 88 %
FEF2575-%Pred-Pre: 73 %
FEV1-%Change-Post: 11 %
FEV1-%Pred-Post: 100 %
FEV1-%Pred-Pre: 89 %
FEV1-Post: 2.3 L
FEV1-Pre: 2.06 L
FEV1FVC-%Change-Post: 13 %
FEV1FVC-%Pred-Pre: 84 %
FEV6-%Change-Post: 0 %
FEV6-%Pred-Post: 111 %
FEV6-%Pred-Pre: 110 %
FEV6-Post: 3.35 L
FEV6-Pre: 3.34 L
FEV6FVC-%Pred-Post: 108 %
FEV6FVC-%Pred-Pre: 108 %
FVC-%Change-Post: -1 %
FVC-%Pred-Post: 102 %
FVC-%Pred-Pre: 104 %
FVC-Post: 3.35 L
FVC-Pre: 3.41 L
Post FEV1/FVC ratio: 69 %
Post FEV6/FVC ratio: 100 %
Pre FEV1/FVC ratio: 60 %
Pre FEV6/FVC Ratio: 100 %
RV % pred: 72 %
RV: 1.72 L
TLC % pred: 86 %
TLC: 5.25 L

## 2020-04-06 MED ORDER — ALBUTEROL SULFATE HFA 108 (90 BASE) MCG/ACT IN AERS: 2.0000 | INHALATION_SPRAY | Freq: Four times a day (QID) | RESPIRATORY_TRACT | 5 refills | Status: AC | PRN

## 2020-04-06 NOTE — Assessment & Plan Note (Signed)
Diagnosis confirmed in the right upper lobe nodule, left upper lobe nodule still unclear, to be followed with serial imaging.  Suspect that if it grows he will undergo SBRT at that location as well.  If he needs a repeat biopsy I can help to facilitate.

## 2020-04-06 NOTE — Progress Notes (Signed)
Subjective:    Patient ID: Robert Tran, male    DOB: 06-22-1941, 79 y.o.   MRN: [id number]  HPI 79 year old smoker (70 pack years) with a history of CAD, hypertension, chronic kidney disease stage III, diabetes type 2, hyperlipidemia, polycythemia vera, diverticulosis (previous GI bleed).  He is referred today by Dr. Marin Olp from oncology for further evaluation of of an abnormal CT chest, PET scan.  Right upper lobe nodular density was noted on chest x-ray 5/6 when he had a cardiac catheterization which prompted CT chest and then PET.  He is feeling well, denies SOB. Good functional capacity. He coughs daily. Has a lot of allergy drainage. Has albuterol but never uses it. No CP, no wheeze  CT scan of the chest 01/15/2020 reviewed by me, shows a 3.1 x 1.9 spiculated right upper lobe nodule at the right major fissure, 2.1 x 1.6 more anterior right upper lobe spiculated nodule, 1.6 x 1.0 cm lingular spiculated density.  Focal bulge involving visualized portion of the midpole of the left kidney  PET scan 02/01/2020 reviewed by me, shows hypermetabolism in the 2 right upper lobe nodules as well as mild activity and the lingular nodule.  Note made of left upper pole renal cyst without hypermetabolism, diverticular disease, no evidence of distant metastasis  Cardiac catheterization 11/19/2019 ([facility]) >> 1 vessel CAD with severe ostial RCA stenosis developing left to right collaterals, preserved LV function.  Medical therapy recommended.   ROV 04/06/20 --follow-up visit for 79 year old active smoker (63 pack years) seen for his history of tobacco use and abnormal CT scan of the chest, PET scan with hypermetabolic right upper lobe and lingular nodules.  He underwent bronchoscopy on 02/18/2020 and we confirmed that the largest right upper lobe nodule was adenocarcinoma, left upper lobe nodule was negative.  He is about to undergo SBRT to the RUL lesion with Dr Sondra Come. Following the L nodule.   He believes that his  breathing is doing OK. He is not limited - able to exert without difficulty. Not a lot of cough (on ramipril). Has albuterol never gets used, is probably out of date. Hasn't had the flu shot yet. Did not do the COVID shots.   Pulmonary function testing done today reviewed by me, show mild obstruction with a borderline bronchodilator response, some evidence for concomitant restriction based on a decreased RV, decreased diffusion capacity that does not fully correct to the normal range when adjusted for his alveolar volume.  Review of Systems As per HPI  Past Medical History:  Diagnosis Date  . Chronic bronchitis ([place])   . Chronic kidney disease    stage 3  . Coronary artery disease   . Diabetes mellitus without complication ([place])    type 2  . Diverticulosis of intestine with bleeding 07/19/2017  . Diverticulosis of large intestine with hemorrhage 07/19/2017  . Dyspnea   . History of kidney stones    passed stones and lithotripsy  . HLD (hyperlipidemia)   . Hypertension   . Iron deficiency anemia 07/19/2017  . Melanoma ([place])    on back     No family history on file.   Social History   Socioeconomic History  . Marital status: Married    Spouse name: Not on file  . Number of children: Not on file  . Years of education: Not on file  . Highest education level: Not on file  Occupational History  . Not on file  Tobacco Use  . Smoking status: Current Every  Day Smoker    Packs/day: 1.50    Years: 63.00    Pack years: 94.50    Types: Cigarettes    Start date: 07/06/1941  . Smokeless tobacco: Never Used  . Tobacco comment: 2-3 cigarettes smoked daily 04/06/20 [facility]   Vaping Use  . Vaping Use: Never used  Substance and Sexual Activity  . Alcohol use: Yes    Alcohol/week: 2.0 standard drinks    Types: 2 Shots of liquor per week    Comment: 1/5 once a month   . Drug use: Never  . Sexual activity: Not Currently  Other Topics Concern  . Not on file  Social History Narrative  . Not  on file   Social Determinants of Health   Financial Resource Strain:   . Difficulty of Paying Living Expenses: Not on file  Food Insecurity:   . Worried About Charity fundraiser in the Last Year: Not on file  . Ran Out of Food in the Last Year: Not on file  Transportation Needs:   . Lack of Transportation (Medical): Not on file  . Lack of Transportation (Non-Medical): Not on file  Physical Activity:   . Days of Exercise per Week: Not on file  . Minutes of Exercise per Session: Not on file  Stress:   . Feeling of Stress : Not on file  Social Connections:   . Frequency of Communication with Friends and Family: Not on file  . Frequency of Social Gatherings with Friends and Family: Not on file  . Attends Religious Services: Not on file  . Active Member of Clubs or Organizations: Not on file  . Attends Archivist Meetings: Not on file  . Marital Status: Not on file  Intimate Partner Violence:   . Fear of Current or Ex-Partner: Not on file  . Emotionally Abused: Not on file  . Physically Abused: Not on file  . Sexually Abused: Not on file     Allergies  Allergen Reactions  . Pravastatin Sodium Other (See Comments)    Patient stated,"it messed up my back and legs, I couldn't walk."  . Minocycline Nausea Only  . Sulfa Antibiotics Nausea Only  . Sulfa Drugs Cross Reactors Nausea Only     Outpatient Medications Prior to Visit  Medication Sig Dispense Refill  . acetaminophen (TYLENOL) 500 MG tablet Take 1,000 mg by mouth every 6 (six) hours as needed for moderate pain or headache.    . albuterol (PROVENTIL HFA;VENTOLIN HFA) 108 (90 Base) MCG/ACT inhaler Inhale 2 puffs into the lungs every 6 (six) hours as needed for wheezing or shortness of breath.     Marland Kitchen amLODipine (NORVASC) 10 MG tablet Take 10 mg by mouth daily.    . APPLE CIDER VINEGAR PO Take 15 mLs by mouth daily.    Marland Kitchen aspirin EC 81 MG tablet Take 81 mg by mouth daily.    Marland Kitchen atorvastatin (LIPITOR) 80 MG tablet Take  40 mg by mouth at bedtime.     . Cholecalciferol (VITAMIN D) 125 MCG (5000 UT) CAPS Take 5,000 Units by mouth daily.     . clopidogrel (PLAVIX) 75 MG tablet Take 1 tablet (75 mg total) by mouth daily. Okay to restart this medication on 02/20/2020.    . fluocinonide (LIDEX) 0.05 % external solution Apply 1 application topically every other day.     . folic acid (FOLVITE) 374 MCG tablet Take 400 mcg by mouth every morning.     Marland Kitchen glimepiride (AMARYL) 1  MG tablet Take 1 mg by mouth every morning.    . metFORMIN (GLUCOPHAGE-XR) 500 MG 24 hr tablet Take 1,000 mg by mouth at bedtime.     . metoprolol succinate (TOPROL-XL) 25 MG 24 hr tablet Take 25 mg by mouth daily.    Marland Kitchen oxyCODONE (OXY IR/ROXICODONE) 5 MG immediate release tablet Take 5 mg by mouth every 8 (eight) hours as needed for severe pain.     . ramipril (ALTACE) 10 MG tablet Take 10 mg by mouth daily.    . tamsulosin (FLOMAX) 0.4 MG CAPS capsule Take 0.4 mg by mouth daily as needed (kidney stones).     No facility-administered medications prior to visit.        Objective:   Physical Exam  Vitals:   04/06/20 1000  BP: 124/68  Pulse: 77  Temp: 97.7 F (36.5 C)  TempSrc: Temporal  SpO2: 98%  Weight: 170 lb (77.1 kg)  Height: 5\' 5"  (1.651 m)   Gen: Pleasant, well-nourished, in no distress,  normal affect  ENT: No lesions,  mouth clear,  oropharynx clear, no postnasal drip  Neck: No JVD, no stridor  Lungs: No use of accessory muscles, coarse, no crackles or wheezing on normal respiration, no wheeze on forced expiration  Cardiovascular: RRR, heart sounds normal, no murmur or gallops, no peripheral edema  Musculoskeletal: No deformities, no cyanosis or clubbing  Neuro: alert, awake, non focal  Skin: Warm, no lesions or rash     Assessment & Plan:  Tobacco use He is not sure about setting a quit date.  Discussed cutting down with him today.  Primary cancer of right upper lobe of lung (HCC) Diagnosis confirmed in the right  upper lobe nodule, left upper lobe nodule still unclear, to be followed with serial imaging.  Suspect that if it grows he will undergo SBRT at that location as well.  If he needs a repeat biopsy I can help to facilitate.  COPD, mild ([place]) Confirmed on his pulmonary function testing today, mild obstruction with a borderline bronchodilator response.  He is asymptomatic, gold class a.  We will not start a schedule bronchodilator at this time.  Discussed keeping albuterol available to use if needed.  His inhaler is expired and we will get him a new one.  He needs a flu shot today.  He has not had COVID-19 vaccine, he is fearful of it.  I discussed with him today, asked him to reconsider based on our increased experience with it and the risk benefit ratio.  He is going to think about it.  Baltazar Apo, MD, PhD 04/06/2020, 10:27 AM [facility] [phone] or if no answer [phone]

## 2020-04-06 NOTE — Patient Instructions (Signed)
Please continue to follow with Dr. Marin Olp and Dr. Sondra Come as planned We will refill your albuterol.  Throw your expired inhaler away.  You can use 2 puffs up to every 4 hours if you need it for shortness of breath, chest tightness, wheezing. We will not start a scheduled inhaled medication at this time.  We could reconsider in the future if your breathing changes. Flu shot today You would probably benefit from getting the COVID-19 vaccine.  Let us know if you change your mind about this and we can help you get it. Follow with Dr. Lamonte Sakai in 12 months or sooner if you have any problems.

## 2020-04-06 NOTE — Addendum Note (Signed)
Addended by: Gavin Potters R on: 04/06/2020 11:24 AM   Modules accepted: Orders

## 2020-04-06 NOTE — Progress Notes (Signed)
Full PFT performed today. °

## 2020-04-06 NOTE — Assessment & Plan Note (Signed)
He is not sure about setting a quit date.  Discussed cutting down with him today.

## 2020-04-06 NOTE — Assessment & Plan Note (Signed)
Confirmed on his pulmonary function testing today, mild obstruction with a borderline bronchodilator response.  He is asymptomatic, gold class a.  We will not start a schedule bronchodilator at this time.  Discussed keeping albuterol available to use if needed.  His inhaler is expired and we will get him a new one.  He needs a flu shot today.  He has not had COVID-19 vaccine, he is fearful of it.  I discussed with him today, asked him to reconsider based on our increased experience with it and the risk benefit ratio.  He is going to think about it.

## 2020-04-07 ENCOUNTER — Inpatient Hospital Stay: Payer: Medicare Other

## 2020-04-07 ENCOUNTER — Inpatient Hospital Stay (HOSPITAL_BASED_OUTPATIENT_CLINIC_OR_DEPARTMENT_OTHER): Payer: Medicare Other | Admitting: Hematology & Oncology

## 2020-04-07 ENCOUNTER — Encounter: Payer: Self-pay | Admitting: Hematology & Oncology

## 2020-04-07 ENCOUNTER — Inpatient Hospital Stay: Payer: Medicare Other | Attending: Hematology & Oncology

## 2020-04-07 VITALS — BP 140/92 | HR 95 | Temp 97.8°F | Resp 20 | Wt 170.4 lb

## 2020-04-07 DIAGNOSIS — C3411 Malignant neoplasm of upper lobe, right bronchus or lung: Secondary | ICD-10-CM | POA: Diagnosis present

## 2020-04-07 DIAGNOSIS — D45 Polycythemia vera: Secondary | ICD-10-CM | POA: Diagnosis present

## 2020-04-07 LAB — CBC WITH DIFFERENTIAL (CANCER CENTER ONLY)
Abs Immature Granulocytes: 0.06 10*3/uL (ref 0.00–0.07)
Basophils Absolute: 0.1 10*3/uL (ref 0.0–0.1)
Basophils Relative: 1 %
Eosinophils Absolute: 0.3 10*3/uL (ref 0.0–0.5)
Eosinophils Relative: 3 %
HCT: 44.5 % (ref 39.0–52.0)
Hemoglobin: 14.6 g/dL (ref 13.0–17.0)
Immature Granulocytes: 1 %
Lymphocytes Relative: 26 %
Lymphs Abs: 2.3 10*3/uL (ref 0.7–4.0)
MCH: 29.4 pg (ref 26.0–34.0)
MCHC: 32.8 g/dL (ref 30.0–36.0)
MCV: 89.7 fL (ref 80.0–100.0)
Monocytes Absolute: 0.7 10*3/uL (ref 0.1–1.0)
Monocytes Relative: 8 %
Neutro Abs: 5.4 10*3/uL (ref 1.7–7.7)
Neutrophils Relative %: 61 %
Platelet Count: 294 10*3/uL (ref 150–400)
RBC: 4.96 MIL/uL (ref 4.22–5.81)
RDW: 14.7 % (ref 11.5–15.5)
WBC Count: 8.8 10*3/uL (ref 4.0–10.5)
nRBC: 0 % (ref 0.0–0.2)

## 2020-04-07 LAB — CMP (CANCER CENTER ONLY)
ALT: 12 U/L (ref 0–44)
AST: 9 U/L — ABNORMAL LOW (ref 15–41)
Albumin: 3.9 g/dL (ref 3.5–5.0)
Alkaline Phosphatase: 64 U/L (ref 38–126)
Anion gap: 6 (ref 5–15)
BUN: 31 mg/dL — ABNORMAL HIGH (ref 8–23)
CO2: 27 mmol/L (ref 22–32)
Calcium: 10.4 mg/dL — ABNORMAL HIGH (ref 8.9–10.3)
Chloride: 107 mmol/L (ref 98–111)
Creatinine: 1.46 mg/dL — ABNORMAL HIGH (ref 0.61–1.24)
GFR, Est AFR Am: 52 mL/min — ABNORMAL LOW (ref >60)
GFR, Estimated: 45 mL/min — ABNORMAL LOW (ref >60)
Glucose, Bld: 223 mg/dL — ABNORMAL HIGH (ref 70–99)
Potassium: 4.2 mmol/L (ref 3.5–5.1)
Sodium: 140 mmol/L (ref 135–145)
Total Bilirubin: 0.4 mg/dL (ref 0.3–1.2)
Total Protein: 6.5 g/dL (ref 6.5–8.1)

## 2020-04-07 LAB — LACTATE DEHYDROGENASE: LDH: 116 U/L (ref 98–192)

## 2020-04-07 NOTE — Progress Notes (Signed)
Hematology and Oncology Follow Up Visit  Robert Tran [id number] 05-16-1941 79 y.o. 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  He will start his stereotactic radiosurgery on October 12.  He will have 3 treatments.  I feel confident that this will help to significantly decrease the size of these 2 not lung cancers.  Hopefully, the radiosurgery will eradicate these.  He feels good.  He has had no problems with cough.  There is no hemoptysis.  He has had no chest wall pain.  He is still smoking.  Still drinking quite a bit of coffee.  His blood sugars are still on the high side.  Hopefully he will help to better control these.  [place]  I am sure that they will have a wonderful time.    Currently, I would have to say that his performance status is ECOG 1.     Medications:  Allergies as of 04/07/2020      Reactions   Pravastatin Sodium Other (See Comments)   Patient stated,"it messed up my back and legs, I couldn't walk."   Minocycline Nausea Only   Sulfa Antibiotics Nausea Only   Sulfa Drugs Cross Reactors Nausea Only      Medication List       Accurate as of April 07, 2020  1:29 PM. If you have any questions, ask your nurse or doctor.        acetaminophen 500 MG tablet Commonly known as: TYLENOL Take 1,000 mg by mouth every 6 (six) hours as needed for moderate pain or headache.   albuterol 108 (90 Base) MCG/ACT inhaler Commonly known as: VENTOLIN HFA Inhale 2 puffs into the lungs every 6 (six) hours as needed for wheezing or shortness of breath.   amLODipine 10 MG tablet Commonly known as: NORVASC Take 10 mg by mouth daily.   APPLE CIDER VINEGAR PO Take 15 mLs by mouth  daily.   aspirin EC 81 MG tablet Take 81 mg by mouth daily.   atorvastatin 80 MG tablet Commonly known as: LIPITOR Take 40 mg by mouth at bedtime.   clopidogrel 75 MG tablet Commonly known as: Plavix Take 1 tablet (75 mg total) by mouth daily. Okay to restart this medication on 02/20/2020.   fluocinonide 0.05 % external solution Commonly known as: LIDEX Apply 1 application topically every other day.   folic acid 892 MCG tablet Commonly known as: FOLVITE Take 400 mcg by mouth every morning.   glimepiride 1 MG tablet Commonly known as: AMARYL Take 1 mg by mouth every morning.   metFORMIN 500 MG 24 hr tablet Commonly known as: GLUCOPHAGE-XR Take 1,000 mg by mouth at bedtime.   metoprolol succinate 25 MG 24 hr tablet Commonly known as: TOPROL-XL Take 25 mg by mouth daily.   nicotine 21 mg/24hr patch Commonly known as: NICODERM CQ - dosed in mg/24 hours Place onto the skin.   oxyCODONE 5 MG immediate release tablet Commonly known as: Oxy IR/ROXICODONE Take 5 mg by mouth every 8 (eight) hours as needed for severe pain.   ramipril 10 MG tablet Commonly known as: ALTACE Take 10 mg by mouth daily.   tamsulosin 0.4 MG Caps capsule Commonly known as: FLOMAX  Take 0.4 mg by mouth daily as needed (kidney stones).   Vitamin D 125 MCG (5000 UT) Caps Take 5,000 Units by mouth daily.       Allergies:  Allergies  Allergen Reactions  . Pravastatin Sodium Other (See Comments)    Patient stated,"it messed up my back and legs, I couldn't walk."  . Minocycline Nausea Only  . Sulfa Antibiotics Nausea Only  . Sulfa Drugs Cross Reactors Nausea Only    Past Medical History, Surgical history, Social history, and Family History were reviewed and updated.  Review of Systems: Review of Systems  Constitutional: Negative.   HENT: Negative.   Eyes: Negative.   Respiratory: Negative.   Cardiovascular: Negative.   Gastrointestinal: Negative.   Genitourinary: Negative.     Musculoskeletal: Negative.   Skin: Negative.   Neurological: Negative.   Endo/Heme/Allergies: Negative.   Psychiatric/Behavioral: Negative.     Physical Exam:  weight is 170 lb 6.4 oz (77.3 kg). His oral temperature is 97.8 F (36.6 C). His blood pressure is 140/92 (abnormal) and his pulse is 95. His respiration is 20 and oxygen saturation is 99%.   Wt Readings from Last 3 Encounters:  04/07/20 170 lb 6.4 oz (77.3 kg)  04/06/20 170 lb (77.1 kg)  03/30/20 169 lb (76.7 kg)    Physical Exam Vitals reviewed.  HENT:     Head: Normocephalic and atraumatic.  Eyes:     Pupils: Pupils are equal, round, and reactive to light.  Cardiovascular:     Rate and Rhythm: Normal rate and regular rhythm.     Heart sounds: Normal heart sounds.  Pulmonary:     Effort: Pulmonary effort is normal.     Breath sounds: Normal breath sounds.  Abdominal:     General: Bowel sounds are normal.     Palpations: Abdomen is soft.  Musculoskeletal:        General: No tenderness or deformity. Normal range of motion.     Cervical back: Normal range of motion.  Lymphadenopathy:     Cervical: No cervical adenopathy.  Skin:    General: Skin is warm and dry.     Findings: No erythema or rash.  Neurological:     Mental Status: He is alert and oriented to person, place, and time.  Psychiatric:        Behavior: Behavior normal.        Thought Content: Thought content normal.        Judgment: Judgment normal.      Lab Results  Component Value Date   WBC 8.8 04/07/2020   HGB 14.6 04/07/2020   HCT 44.5 04/07/2020   MCV 89.7 04/07/2020   PLT 294 04/07/2020   Lab Results  Component Value Date   FERRITIN 12 (L) 01/21/2020   IRON 46 01/21/2020   TIBC 373 01/21/2020   UIBC 326 01/21/2020   IRONPCTSAT 12 (L) 01/21/2020   Lab Results  Component Value Date   RETICCTPCT 2.0 12/07/2019   RBC 4.96 04/07/2020   RETICCTABS 77.3 06/20/2015   No results found for: KPAFRELGTCHN, LAMBDASER, KAPLAMBRATIO No  results found for: IGGSERUM, IGA, IGMSERUM No results found for: Ronnald Ramp, A1GS, A2GS, Violet Baldy, MSPIKE, SPEI   Chemistry      Component Value Date/Time   NA 140 04/07/2020 1156   NA 142 07/18/2017 1421   NA 134 (L) 08/09/2016 1313   K 4.2 04/07/2020 1156   K 4.0 07/18/2017 1421   K 4.2 08/09/2016 1313   CL  107 04/07/2020 1156   CL 103 07/18/2017 1421   CO2 27 04/07/2020 1156   CO2 29 07/18/2017 1421   CO2 20 (L) 08/09/2016 1313   BUN 31 (H) 04/07/2020 1156   BUN 19 07/18/2017 1421   BUN 19.9 08/09/2016 1313   CREATININE 1.46 (H) 04/07/2020 1156   CREATININE 1.4 (H) 07/18/2017 1421   CREATININE 1.3 08/09/2016 1313      Component Value Date/Time   CALCIUM 10.4 (H) 04/07/2020 1156   CALCIUM 10.2 07/18/2017 1421   CALCIUM 10.0 08/09/2016 1313   ALKPHOS 64 04/07/2020 1156   ALKPHOS 65 07/18/2017 1421   ALKPHOS 75 08/09/2016 1313   AST 9 (L) 04/07/2020 1156   AST 14 08/09/2016 1313   ALT 12 04/07/2020 1156   ALT 21 07/18/2017 1421   ALT 23 08/09/2016 1313   BILITOT 0.4 04/07/2020 1156   BILITOT 0.34 08/09/2016 1313      Impression and Plan: Robert Tran is a 79 year old male. He has polycythemia. 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 However, phlebotomizing him definitely makes him feel better.  We will see him after he has the radiosurgery.  I probably will get him back the first week in November.  I would not plan for a follow-up PET scan probably until late December.  He does not need to be phlebotomized today.     Volanda Napoleon, MD 9/23/20211:29 PM

## 2020-04-21 DIAGNOSIS — C3411 Malignant neoplasm of upper lobe, right bronchus or lung: Secondary | ICD-10-CM | POA: Insufficient documentation

## 2020-04-26 ENCOUNTER — Ambulatory Visit
Admission: RE | Admit: 2020-04-26 | Discharge: 2020-04-26 | Disposition: A | Discharge status: Home / Self Care | Payer: Medicare Other | Source: Ambulatory Visit | Attending: Radiation Oncology | Admitting: Radiation Oncology

## 2020-04-26 DIAGNOSIS — C3411 Malignant neoplasm of upper lobe, right bronchus or lung: Secondary | ICD-10-CM

## 2020-04-27 ENCOUNTER — Ambulatory Visit: Payer: Medicare Other | Admitting: Radiation Oncology

## 2020-04-28 ENCOUNTER — Ambulatory Visit
Admission: RE | Admit: 2020-04-28 | Discharge: 2020-04-28 | Disposition: A | Discharge status: Home / Self Care | Payer: Medicare Other | Source: Ambulatory Visit | Attending: Radiation Oncology | Admitting: Radiation Oncology

## 2020-04-28 DIAGNOSIS — C3411 Malignant neoplasm of upper lobe, right bronchus or lung: Secondary | ICD-10-CM

## 2020-05-02 ENCOUNTER — Other Ambulatory Visit: Payer: Self-pay

## 2020-05-02 ENCOUNTER — Ambulatory Visit
Admission: RE | Admit: 2020-05-02 | Discharge: 2020-05-02 | Disposition: A | Discharge status: Home / Self Care | Payer: Medicare Other | Source: Ambulatory Visit | Attending: Radiation Oncology | Admitting: Radiation Oncology

## 2020-05-02 DIAGNOSIS — C3411 Malignant neoplasm of upper lobe, right bronchus or lung: Secondary | ICD-10-CM

## 2020-05-04 ENCOUNTER — Ambulatory Visit
Admission: RE | Admit: 2020-05-04 | Discharge: 2020-05-04 | Disposition: A | Discharge status: Home / Self Care | Payer: Medicare Other | Source: Ambulatory Visit | Attending: Radiation Oncology | Admitting: Radiation Oncology

## 2020-05-04 ENCOUNTER — Other Ambulatory Visit: Payer: Self-pay

## 2020-05-04 DIAGNOSIS — C3411 Malignant neoplasm of upper lobe, right bronchus or lung: Secondary | ICD-10-CM

## 2020-05-06 ENCOUNTER — Ambulatory Visit
Admission: RE | Admit: 2020-05-06 | Discharge: 2020-05-06 | Disposition: A | Discharge status: Home / Self Care | Payer: Medicare Other | Source: Ambulatory Visit | Attending: Radiation Oncology | Admitting: Radiation Oncology

## 2020-05-06 DIAGNOSIS — C3411 Malignant neoplasm of upper lobe, right bronchus or lung: Secondary | ICD-10-CM | POA: Diagnosis not present

## 2020-05-19 ENCOUNTER — Inpatient Hospital Stay: Payer: Medicare Other

## 2020-05-19 ENCOUNTER — Inpatient Hospital Stay (HOSPITAL_BASED_OUTPATIENT_CLINIC_OR_DEPARTMENT_OTHER): Payer: Medicare Other | Admitting: Hematology & Oncology

## 2020-05-19 ENCOUNTER — Other Ambulatory Visit: Payer: Self-pay

## 2020-05-19 ENCOUNTER — Inpatient Hospital Stay: Payer: Medicare Other | Attending: Hematology & Oncology

## 2020-05-19 ENCOUNTER — Encounter: Payer: Self-pay | Admitting: Hematology & Oncology

## 2020-05-19 VITALS — BP 115/80 | HR 97 | Temp 97.9°F | Resp 20 | Wt 169.0 lb

## 2020-05-19 DIAGNOSIS — C3411 Malignant neoplasm of upper lobe, right bronchus or lung: Secondary | ICD-10-CM

## 2020-05-19 DIAGNOSIS — D500 Iron deficiency anemia secondary to blood loss (chronic): Secondary | ICD-10-CM

## 2020-05-19 DIAGNOSIS — Z85118 Personal history of other malignant neoplasm of bronchus and lung: Secondary | ICD-10-CM | POA: Insufficient documentation

## 2020-05-19 DIAGNOSIS — D45 Polycythemia vera: Secondary | ICD-10-CM | POA: Diagnosis present

## 2020-05-19 LAB — CMP (CANCER CENTER ONLY)
ALT: 10 U/L (ref 0–44)
AST: 9 U/L — ABNORMAL LOW (ref 15–41)
Albumin: 4.1 g/dL (ref 3.5–5.0)
Alkaline Phosphatase: 71 U/L (ref 38–126)
Anion gap: 5 (ref 5–15)
BUN: 23 mg/dL (ref 8–23)
CO2: 27 mmol/L (ref 22–32)
Calcium: 10.7 mg/dL — ABNORMAL HIGH (ref 8.9–10.3)
Chloride: 105 mmol/L (ref 98–111)
Creatinine: 1.4 mg/dL — ABNORMAL HIGH (ref 0.61–1.24)
GFR, Estimated: 51 mL/min — ABNORMAL LOW (ref >60)
Glucose, Bld: 251 mg/dL — ABNORMAL HIGH (ref 70–99)
Potassium: 4.8 mmol/L (ref 3.5–5.1)
Sodium: 137 mmol/L (ref 135–145)
Total Bilirubin: 0.3 mg/dL (ref 0.3–1.2)
Total Protein: 6.7 g/dL (ref 6.5–8.1)

## 2020-05-19 LAB — CBC WITH DIFFERENTIAL (CANCER CENTER ONLY)
Abs Immature Granulocytes: 0.07 10*3/uL (ref 0.00–0.07)
Basophils Absolute: 0.1 10*3/uL (ref 0.0–0.1)
Basophils Relative: 1 %
Eosinophils Absolute: 0.3 10*3/uL (ref 0.0–0.5)
Eosinophils Relative: 4 %
HCT: 44.3 % (ref 39.0–52.0)
Hemoglobin: 14.1 g/dL (ref 13.0–17.0)
Immature Granulocytes: 1 %
Lymphocytes Relative: 17 %
Lymphs Abs: 1.4 10*3/uL (ref 0.7–4.0)
MCH: 28.7 pg (ref 26.0–34.0)
MCHC: 31.8 g/dL (ref 30.0–36.0)
MCV: 90 fL (ref 80.0–100.0)
Monocytes Absolute: 0.7 10*3/uL (ref 0.1–1.0)
Monocytes Relative: 8 %
Neutro Abs: 5.8 10*3/uL (ref 1.7–7.7)
Neutrophils Relative %: 69 %
Platelet Count: 305 10*3/uL (ref 150–400)
RBC: 4.92 MIL/uL (ref 4.22–5.81)
RDW: 14 % (ref 11.5–15.5)
WBC Count: 8.4 10*3/uL (ref 4.0–10.5)
nRBC: 0 % (ref 0.0–0.2)

## 2020-05-19 NOTE — Progress Notes (Signed)
Hematology and Oncology Follow Up Visit  Robert Tran [id number] 1940/12/27 79 y.o. 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  He had his radiosurgery.  He had 5 treatments.  He completed this on October 18.  He did very well with this.  He had stopped smoking.  Unfortunately he had a "relapse" and started to smoke again.  He is now try to use a nicotine patch.  He has had no problems with cough or shortness of breath.  He has had no hemoptysis.  He has had no nausea or vomiting.  He has had no headache.  There is been no problems with leg swelling.  He has had no weakness.  He is wife are going down to [place] for Thanksgiving to be at a daughter's house.  Currently, his performance status is ECOG 1.    Medications:  Allergies as of 05/19/2020      Reactions   Pravastatin Sodium Other (See Comments)   Patient stated,"it messed up my back and legs, I couldn't walk."   Minocycline Nausea Only   Sulfa Antibiotics Nausea Only   Sulfa Drugs Cross Reactors Nausea Only      Medication List       Accurate as of May 19, 2020  2:16 PM. If you have any questions, ask your nurse or doctor.        STOP taking these medications   atorvastatin 80 MG tablet Commonly known as: LIPITOR Stopped by: Volanda Napoleon, MD   oxyCODONE 5 MG immediate release tablet Commonly known as: Oxy IR/ROXICODONE Stopped by: Volanda Napoleon, MD     TAKE these medications   acetaminophen 500 MG tablet Commonly known as: TYLENOL Take 1,000 mg by mouth every 6 (six) hours as needed for moderate pain or headache.   albuterol 108 (90 Base) MCG/ACT inhaler Commonly known  as: VENTOLIN HFA Inhale 2 puffs into the lungs every 6 (six) hours as needed for wheezing or shortness of breath.   amLODipine 10 MG tablet Commonly known as: NORVASC Take 10 mg by mouth daily.   APPLE CIDER VINEGAR PO Take 15 mLs by mouth daily.   aspirin EC 81 MG tablet Take 81 mg by mouth daily.   clopidogrel 75 MG tablet Commonly known as: Plavix Take 1 tablet (75 mg total) by mouth daily. Okay to restart this medication on 02/20/2020.   fluocinonide 0.05 % external solution Commonly known as: LIDEX Apply 1 application topically every other day.   folic acid 626 MCG tablet Commonly known as: FOLVITE Take 400 mcg by mouth every morning.   glimepiride 1 MG tablet Commonly known as: AMARYL Take 1 mg by mouth every morning.   metFORMIN 500 MG 24 hr tablet Commonly known as: GLUCOPHAGE-XR Take 1,000 mg by mouth at bedtime.   metoprolol succinate 25 MG 24 hr tablet Commonly known as: TOPROL-XL Take 25 mg by mouth daily.   nicotine 21 mg/24hr patch Commonly known as: NICODERM CQ - dosed in mg/24 hours Place onto the skin.   ramipril 10 MG tablet Commonly known as: ALTACE Take 10 mg by mouth daily.   Repatha SureClick 948 MG/ML Soaj Generic drug: Evolocumab Inject 140 mg into the  skin every 14 (fourteen) days.   tamsulosin 0.4 MG Caps capsule Commonly known as: FLOMAX Take 0.4 mg by mouth daily as needed (kidney stones).   Vitamin D 125 MCG (5000 UT) Caps Take 5,000 Units by mouth daily.       Allergies:  Allergies  Allergen Reactions  . Pravastatin Sodium Other (See Comments)    Patient stated,"it messed up my back and legs, I couldn't walk."  . Minocycline Nausea Only  . Sulfa Antibiotics Nausea Only  . Sulfa Drugs Cross Reactors Nausea Only    Past Medical History, Surgical history, Social history, and Family History were reviewed and updated.  Review of Systems: Review of Systems  Constitutional: Negative.   HENT: Negative.   Eyes: Negative.    Respiratory: Negative.   Cardiovascular: Negative.   Gastrointestinal: Negative.   Genitourinary: Negative.   Musculoskeletal: Negative.   Skin: Negative.   Neurological: Negative.   Endo/Heme/Allergies: Negative.   Psychiatric/Behavioral: Negative.     Physical Exam:  weight is 169 lb (76.7 kg). His oral temperature is 97.9 F (36.6 C). His blood pressure is 115/80 and his pulse is 97. His respiration is 20 and oxygen saturation is 98%.   Wt Readings from Last 3 Encounters:  05/19/20 169 lb (76.7 kg)  04/07/20 170 lb 6.4 oz (77.3 kg)  04/06/20 170 lb (77.1 kg)    Physical Exam Vitals reviewed.  HENT:     Head: Normocephalic and atraumatic.  Eyes:     Pupils: Pupils are equal, round, and reactive to light.  Cardiovascular:     Rate and Rhythm: Normal rate and regular rhythm.     Heart sounds: Normal heart sounds.  Pulmonary:     Effort: Pulmonary effort is normal.     Breath sounds: Normal breath sounds.  Abdominal:     General: Bowel sounds are normal.     Palpations: Abdomen is soft.  Musculoskeletal:        General: No tenderness or deformity. Normal range of motion.     Cervical back: Normal range of motion.  Lymphadenopathy:     Cervical: No cervical adenopathy.  Skin:    General: Skin is warm and dry.     Findings: No erythema or rash.  Neurological:     Mental Status: He is alert and oriented to person, place, and time.  Psychiatric:        Behavior: Behavior normal.        Thought Content: Thought content normal.        Judgment: Judgment normal.      Lab Results  Component Value Date   WBC 8.4 05/19/2020   HGB 14.1 05/19/2020   HCT 44.3 05/19/2020   MCV 90.0 05/19/2020   PLT 305 05/19/2020   Lab Results  Component Value Date   FERRITIN 12 (L) 01/21/2020   IRON 46 01/21/2020   TIBC 373 01/21/2020   UIBC 326 01/21/2020   IRONPCTSAT 12 (L) 01/21/2020   Lab Results  Component Value Date   RETICCTPCT 2.0 12/07/2019   RBC 4.92 05/19/2020    RETICCTABS 77.3 06/20/2015   No results found for: KPAFRELGTCHN, LAMBDASER, KAPLAMBRATIO No results found for: IGGSERUM, IGA, IGMSERUM No results found for: Ronnald Ramp, A1GS, A2GS, Tillman Sers, SPEI   Chemistry      Component Value Date/Time   NA 137 05/19/2020 1207   NA 142 07/18/2017 1421   NA 134 (L) 08/09/2016 1313   K 4.8 05/19/2020 1207   K  4.0 07/18/2017 1421   K 4.2 08/09/2016 1313   CL 105 05/19/2020 1207   CL 103 07/18/2017 1421   CO2 27 05/19/2020 1207   CO2 29 07/18/2017 1421   CO2 20 (L) 08/09/2016 1313   BUN 23 05/19/2020 1207   BUN 19 07/18/2017 1421   BUN 19.9 08/09/2016 1313   CREATININE 1.40 (H) 05/19/2020 1207   CREATININE 1.4 (H) 07/18/2017 1421   CREATININE 1.3 08/09/2016 1313      Component Value Date/Time   CALCIUM 10.7 (H) 05/19/2020 1207   CALCIUM 10.2 07/18/2017 1421   CALCIUM 10.0 08/09/2016 1313   ALKPHOS 71 05/19/2020 1207   ALKPHOS 65 07/18/2017 1421   ALKPHOS 75 08/09/2016 1313   AST 9 (L) 05/19/2020 1207   AST 14 08/09/2016 1313   ALT 10 05/19/2020 1207   ALT 21 07/18/2017 1421   ALT 23 08/09/2016 1313   BILITOT 0.3 05/19/2020 1207   BILITOT 0.34 08/09/2016 1313      Impression and Plan: Robert Tran is a 79 year old male. He has polycythemia. 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 However, phlebotomizing him definitely makes him feel better.  At this point, we need to set him up with a follow-up PET scan to see how everything looks.  We will set this up for the end of December.  He does not need to be phlebotomized today.  I think we are okay holding off on a phlebotomy.  Hopefully, he will stop smoking again.  I know that he is determined.  I will see him back myself in February.  We will see if he needs to be phlebotomized at that point.    Volanda Napoleon, MD 11/4/20212:16 PM

## 2020-05-20 LAB — IRON AND TIBC
Iron: 64 ug/dL (ref 42–163)
Saturation Ratios: 18 % — ABNORMAL LOW (ref 20–55)
TIBC: 363 ug/dL (ref 202–409)
UIBC: 298 ug/dL (ref 117–376)

## 2020-05-20 LAB — FERRITIN: Ferritin: 12 ng/mL — ABNORMAL LOW (ref 24–336)

## 2020-06-05 NOTE — Progress Notes (Signed)
Radiation Oncology         [phone] ________________________________  Name: Robert Tran MRN: [id number]  Date: 06/06/2020  DOB: 1941-01-08  Follow-Up Visit Note  CC: Robert Berger, MD  Robert Berger, MD    ICD-10-CM   1. 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 Since the end of treatment, he was seen by Dr. Marin Olp on 05/19/2020. PET scan will be ordered for January or February.     On review of systems, he reports feeling well.  He did have some fatigue for a couple weeks after his radiation therapy but none at this point.  He does has occasional coughing related to postnasal drip.  He denies any chest pain or hemoptysis.Marland Kitchen  ALLERGIES:  is allergic to pravastatin sodium, minocycline, sulfa antibiotics, and sulfa drugs cross reactors.  Meds: Current Outpatient Medications  Medication Sig Dispense Refill   acetaminophen (TYLENOL) 500 MG tablet Take 1,000 mg by mouth every 6 (six) hours as needed for moderate pain or headache.     albuterol (VENTOLIN HFA) 108 (90 Base) MCG/ACT inhaler Inhale 2 puffs into the lungs every 6 (six) hours as needed for wheezing or shortness of breath. 18 g 5   amLODipine (NORVASC) 10 MG tablet Take 10 mg by mouth daily.     APPLE CIDER VINEGAR PO Take 15 mLs by mouth daily.     aspirin EC 81 MG tablet Take 81 mg by mouth daily.     Cholecalciferol (VITAMIN D) 125 MCG (5000 UT) CAPS Take 5,000 Units by mouth daily.      clopidogrel (PLAVIX) 75 MG tablet Take 1 tablet (75 mg total) by mouth daily. Okay to restart this medication on 02/20/2020.     fluocinonide (LIDEX) 0.05 % external solution Apply 1 application topically every other  day.      folic acid (FOLVITE) 854 MCG tablet Take 400 mcg by mouth every morning.      glimepiride (AMARYL) 1 MG tablet Take 1 mg by mouth every morning.     metFORMIN (GLUCOPHAGE-XR) 500 MG 24 hr tablet Take 1,000 mg by mouth at bedtime.      metoprolol succinate (TOPROL-XL) 25 MG 24 hr tablet Take 25 mg by mouth daily.     nicotine (NICODERM CQ - DOSED IN MG/24 HOURS) 21 mg/24hr patch Place onto the skin.     ramipril (ALTACE) 10 MG tablet Take 10 mg by mouth daily.     REPATHA SURECLICK 627 MG/ML SOAJ Inject 140 mg into the skin every 14 (fourteen) days.     tamsulosin (FLOMAX) 0.4 MG CAPS capsule Take 0.4 mg by mouth daily as needed (kidney stones).     No current facility-administered medications for this encounter.    Physical Findings: The patient is in no acute distress. Patient is alert and oriented.  height is 5\' 5"  (1.651 m) and weight is 170 lb (77.1 kg). His temperature is 97.7 F (36.5 C). His blood pressure is 146/65 (abnormal) and his pulse is 102 (abnormal). His respiration is 18 and oxygen saturation is 98%. No significant changes. Lungs are clear to auscultation bilaterally.  Occasional wheezing on the right side.  heart has regular  rate and rhythm. No palpable cervical, supraclavicular, or axillary adenopathy. Abdomen soft, non-tender, normal bowel sounds.   Lab Findings: Lab Results  Component Value Date   WBC 8.4 05/19/2020   HGB 14.1 05/19/2020   HCT 44.3 05/19/2020   MCV 90.0 05/19/2020   PLT 305 05/19/2020    Radiographic Findings: No results found.  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  He reports no lingering side effects at this time.  Plan: PET scan will be ordered for January or early February.. The patient is scheduled to follow up with Dr. Marin Olp on 08/18/2020.  In light of the patient's close follow-up with Dr. Marin Olp have not scheduled the patient for formal  follow-up appointment but would glad to see him at any time.     Blair Promise, PhD, MD  This document serves as a record of services personally performed by Gery Pray, MD. It was created on his behalf by Clerance Lav, a trained medical scribe. The creation of this record is based on the scribe's personal observations and the provider's statements to them. This document has been checked and approved by the attending provider.

## 2020-06-06 ENCOUNTER — Other Ambulatory Visit: Payer: Self-pay

## 2020-06-06 ENCOUNTER — Encounter: Payer: Self-pay | Admitting: Radiation Oncology

## 2020-06-06 ENCOUNTER — Ambulatory Visit
Admission: RE | Admit: 2020-06-06 | Discharge: 2020-06-06 | Disposition: A | Discharge status: Home / Self Care | Payer: Medicare Other | Source: Ambulatory Visit | Attending: Radiation Oncology | Admitting: Radiation Oncology

## 2020-06-06 DIAGNOSIS — C3411 Malignant neoplasm of upper lobe, right bronchus or lung: Secondary | ICD-10-CM | POA: Diagnosis present

## 2020-06-06 DIAGNOSIS — Z923 Personal history of irradiation: Secondary | ICD-10-CM | POA: Insufficient documentation

## 2020-06-06 DIAGNOSIS — R5383 Other fatigue: Secondary | ICD-10-CM | POA: Diagnosis not present

## 2020-06-06 DIAGNOSIS — Z7982 Long term (current) use of aspirin: Secondary | ICD-10-CM | POA: Diagnosis not present

## 2020-06-06 DIAGNOSIS — Z79899 Other long term (current) drug therapy: Secondary | ICD-10-CM | POA: Insufficient documentation

## 2020-06-06 DIAGNOSIS — Z7984 Long term (current) use of oral hypoglycemic drugs: Secondary | ICD-10-CM | POA: Insufficient documentation

## 2020-06-06 NOTE — Progress Notes (Signed)
Patient here for a 1 month f/u visit with Dr. Sondra Come. Reports frequent coughing from nasal drainage.Reports a good appetite.Denies shortness of breath.  BP (!) 146/65 (BP Location: Left Arm, Patient Position: Sitting, Cuff Size: Normal)   Pulse (!) 102   Temp 97.7 F (36.5 C)   Resp 18   Ht 5\' 5"  (1.651 m)   Wt 170 lb (77.1 kg)   SpO2 98%   BMI 28.29 kg/m   Wt Readings from Last 3 Encounters:  06/06/20 170 lb (77.1 kg)  05/19/20 169 lb (76.7 kg)  04/07/20 170 lb 6.4 oz (77.3 kg)

## 2020-06-06 NOTE — Addendum Note (Signed)
Encounter addended by: Gery Pray, MD on: 06/06/2020 11:07 AM  Actions taken: Follow-up modified

## 2020-06-21 ENCOUNTER — Ambulatory Visit (HOSPITAL_COMMUNITY)
Admission: RE | Admit: 2020-06-21 | Discharge: 2020-06-21 | Disposition: A | Discharge status: Home / Self Care | Payer: Medicare Other | Source: Ambulatory Visit | Attending: Hematology & Oncology | Admitting: Hematology & Oncology

## 2020-06-21 ENCOUNTER — Other Ambulatory Visit: Payer: Self-pay

## 2020-06-21 DIAGNOSIS — N200 Calculus of kidney: Secondary | ICD-10-CM | POA: Diagnosis not present

## 2020-06-21 DIAGNOSIS — J439 Emphysema, unspecified: Secondary | ICD-10-CM | POA: Diagnosis not present

## 2020-06-21 DIAGNOSIS — I700 Atherosclerosis of aorta: Secondary | ICD-10-CM | POA: Diagnosis not present

## 2020-06-21 DIAGNOSIS — K760 Fatty (change of) liver, not elsewhere classified: Secondary | ICD-10-CM | POA: Diagnosis not present

## 2020-06-21 DIAGNOSIS — C3411 Malignant neoplasm of upper lobe, right bronchus or lung: Secondary | ICD-10-CM | POA: Diagnosis not present

## 2020-06-21 DIAGNOSIS — N400 Enlarged prostate without lower urinary tract symptoms: Secondary | ICD-10-CM | POA: Insufficient documentation

## 2020-06-21 DIAGNOSIS — I2510 Atherosclerotic heart disease of native coronary artery without angina pectoris: Secondary | ICD-10-CM | POA: Insufficient documentation

## 2020-06-21 LAB — GLUCOSE, CAPILLARY: Glucose-Capillary: 145 mg/dL — ABNORMAL HIGH (ref 70–99)

## 2020-06-21 MED ORDER — FLUDEOXYGLUCOSE F - 18 (FDG) INJECTION
8.5200 | Freq: Once | INTRAVENOUS | Status: AC | PRN
  Administered 2020-06-21: 8.52 via INTRAVENOUS

## 2020-06-22 ENCOUNTER — Telehealth: Payer: Self-pay | Admitting: *Deleted

## 2020-06-22 NOTE — Telephone Encounter (Signed)
Patient notified per order of Dr. Marin Olp that "the PET scan looks great!!  The response is continuing!!  Have a very blessed Christmas!! Robert Tran"  Pt appreciative of call and has no questions or concerns at this time.

## 2020-06-22 NOTE — Telephone Encounter (Signed)
-----   Message from Volanda Napoleon, MD sent at 06/22/2020  1:14 PM EST ----- Call - the PET scan looks great!!  The response is continuing!!  Have a very blessed Christmas!!  Pete

## 2020-08-18 ENCOUNTER — Inpatient Hospital Stay: Payer: Medicare Other

## 2020-08-18 ENCOUNTER — Inpatient Hospital Stay (HOSPITAL_BASED_OUTPATIENT_CLINIC_OR_DEPARTMENT_OTHER): Payer: Medicare Other | Admitting: Hematology & Oncology

## 2020-08-18 ENCOUNTER — Telehealth: Payer: Self-pay | Admitting: *Deleted

## 2020-08-18 ENCOUNTER — Other Ambulatory Visit: Payer: Self-pay

## 2020-08-18 ENCOUNTER — Encounter: Payer: Self-pay | Admitting: Hematology & Oncology

## 2020-08-18 ENCOUNTER — Inpatient Hospital Stay: Payer: Medicare Other | Attending: Hematology & Oncology

## 2020-08-18 VITALS — BP 131/72 | HR 85 | Temp 97.7°F | Resp 20 | Wt 171.1 lb

## 2020-08-18 DIAGNOSIS — D45 Polycythemia vera: Secondary | ICD-10-CM

## 2020-08-18 DIAGNOSIS — C3411 Malignant neoplasm of upper lobe, right bronchus or lung: Secondary | ICD-10-CM

## 2020-08-18 DIAGNOSIS — Z85118 Personal history of other malignant neoplasm of bronchus and lung: Secondary | ICD-10-CM | POA: Insufficient documentation

## 2020-08-18 DIAGNOSIS — D500 Iron deficiency anemia secondary to blood loss (chronic): Secondary | ICD-10-CM

## 2020-08-18 LAB — CMP (CANCER CENTER ONLY)
ALT: 10 U/L (ref 0–44)
AST: 9 U/L — ABNORMAL LOW (ref 15–41)
Albumin: 4.2 g/dL (ref 3.5–5.0)
Alkaline Phosphatase: 62 U/L (ref 38–126)
Anion gap: 5 (ref 5–15)
BUN: 21 mg/dL (ref 8–23)
CO2: 28 mmol/L (ref 22–32)
Calcium: 10.5 mg/dL — ABNORMAL HIGH (ref 8.9–10.3)
Chloride: 102 mmol/L (ref 98–111)
Creatinine: 1.33 mg/dL — ABNORMAL HIGH (ref 0.61–1.24)
GFR, Estimated: 54 mL/min — ABNORMAL LOW (ref >60)
Glucose, Bld: 225 mg/dL — ABNORMAL HIGH (ref 70–99)
Potassium: 4.5 mmol/L (ref 3.5–5.1)
Sodium: 135 mmol/L (ref 135–145)
Total Bilirubin: 0.4 mg/dL (ref 0.3–1.2)
Total Protein: 6.6 g/dL (ref 6.5–8.1)

## 2020-08-18 LAB — CBC WITH DIFFERENTIAL (CANCER CENTER ONLY)
Abs Immature Granulocytes: 0.08 10*3/uL — ABNORMAL HIGH (ref 0.00–0.07)
Basophils Absolute: 0.1 10*3/uL (ref 0.0–0.1)
Basophils Relative: 1 %
Eosinophils Absolute: 0.3 10*3/uL (ref 0.0–0.5)
Eosinophils Relative: 3 %
HCT: 46.7 % (ref 39.0–52.0)
Hemoglobin: 15 g/dL (ref 13.0–17.0)
Immature Granulocytes: 1 %
Lymphocytes Relative: 17 %
Lymphs Abs: 1.8 10*3/uL (ref 0.7–4.0)
MCH: 27.6 pg (ref 26.0–34.0)
MCHC: 32.1 g/dL (ref 30.0–36.0)
MCV: 86 fL (ref 80.0–100.0)
Monocytes Absolute: 0.7 10*3/uL (ref 0.1–1.0)
Monocytes Relative: 6 %
Neutro Abs: 7.8 10*3/uL — ABNORMAL HIGH (ref 1.7–7.7)
Neutrophils Relative %: 72 %
Platelet Count: 291 10*3/uL (ref 150–400)
RBC: 5.43 MIL/uL (ref 4.22–5.81)
RDW: 15.7 % — ABNORMAL HIGH (ref 11.5–15.5)
WBC Count: 10.7 10*3/uL — ABNORMAL HIGH (ref 4.0–10.5)
nRBC: 0 % (ref 0.0–0.2)

## 2020-08-18 NOTE — Progress Notes (Signed)
Hematology and Oncology Follow Up Visit  Robert Tran [id number] 04/20/1941 80 y.o. 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  We got him through the holiday season.  He enjoyed the holidays.  He was with family.  Unfortunately, he and his wife are not going out in their RV.  He says is been little bit too cold.  He did have a PET scan done on 06/21/2020.  This showed a very nice response to the stereotactic radiosurgery.  The right upper lobe nodule measured 2.5 x 2.2 cm with an SUV of 7.4.  A second right upper lobe nodule measures 1 cm and is not hypermetabolic.  A lingular nodule measures 1.3 cm with an SUV of 2.4.  These are great responses.  We had to repeat the PET scan in about 2 months.  He is still smoking a little bit.  He does drink his coffee.  He has had no cough.  He has had no problems with nausea or vomiting.  He has had issues with his blood sugars.  He is trying to watch what he eats.  There has been no change in bowel or bladder habits.  Overall, his performance status is ECOG 1.   Medications:  Allergies as of 08/18/2020      Reactions   Pravastatin Sodium Other (See Comments)   Patient stated,"it messed up my back and legs, I couldn't walk."   Minocycline Nausea Only   Sulfa Antibiotics Nausea Only   Sulfa Drugs Cross Reactors Nausea Only      Medication List       Accurate as of August 18, 2020 10:57 AM. If you have any questions, ask your nurse or doctor.        STOP taking these medications   Repatha SureClick 644 MG/ML Soaj Generic drug: Evolocumab Stopped by: Volanda Napoleon, MD     TAKE these medications   acetaminophen  500 MG tablet Commonly known as: TYLENOL Take 1,000 mg by mouth every 6 (six) hours as needed for moderate pain or headache.   albuterol 108 (90 Base) MCG/ACT inhaler Commonly known as: VENTOLIN HFA Inhale 2 puffs into the lungs every 6 (six) hours as needed for wheezing or shortness of breath.   amLODipine 10 MG tablet Commonly known as: NORVASC Take 10 mg by mouth daily.   APPLE CIDER VINEGAR PO Take 15 mLs by mouth daily.   aspirin EC 81 MG tablet Take 81 mg by mouth daily.   atorvastatin 40 MG tablet Commonly known as: LIPITOR 20 mg daily.   clopidogrel 75 MG tablet Commonly known as: Plavix Take 1 tablet (75 mg total) by mouth daily. Okay to restart this medication on 02/20/2020.   fluocinonide 0.05 % external solution Commonly known as: LIDEX Apply 1 application topically every other day.   folic acid 034 MCG tablet Commonly known as: FOLVITE Take 400 mcg by mouth every morning.   glimepiride 1 MG tablet Commonly known as: AMARYL Take 1 mg by mouth every morning.   hydrocortisone 2.5 % cream 1 application daily as needed.   metFORMIN 500 MG 24 hr tablet Commonly known as: GLUCOPHAGE-XR Take 1,000 mg by mouth at bedtime.   metoprolol  succinate 25 MG 24 hr tablet Commonly known as: TOPROL-XL Take 25 mg by mouth daily.   nicotine 21 mg/24hr patch Commonly known as: NICODERM CQ - dosed in mg/24 hours Place onto the skin.   ramipril 10 MG tablet Commonly known as: ALTACE Take 10 mg by mouth daily.   tamsulosin 0.4 MG Caps capsule Commonly known as: FLOMAX Take 0.4 mg by mouth daily as needed (kidney stones).   torsemide 10 MG tablet Commonly known as: DEMADEX   Vitamin D 125 MCG (5000 UT) Caps Take 5,000 Units by mouth daily.       Allergies:  Allergies  Allergen Reactions  . Pravastatin Sodium Other (See Comments)    Patient stated,"it messed up my back and legs, I couldn't walk."  . Minocycline Nausea Only  . Sulfa Antibiotics Nausea Only   . Sulfa Drugs Cross Reactors Nausea Only    Past Medical History, Surgical history, Social history, and Family History were reviewed and updated.  Review of Systems: Review of Systems  Constitutional: Negative.   HENT: Negative.   Eyes: Negative.   Respiratory: Negative.   Cardiovascular: Negative.   Gastrointestinal: Negative.   Genitourinary: Negative.   Musculoskeletal: Negative.   Skin: Negative.   Neurological: Negative.   Endo/Heme/Allergies: Negative.   Psychiatric/Behavioral: Negative.     Physical Exam:  weight is 171 lb 1.3 oz (77.6 kg). His oral temperature is 97.7 F (36.5 C). His blood pressure is 131/72 and his pulse is 85. His respiration is 20 and oxygen saturation is 98%.   Wt Readings from Last 3 Encounters:  08/18/20 171 lb 1.3 oz (77.6 kg)  06/06/20 170 lb (77.1 kg)  05/19/20 169 lb (76.7 kg)    Physical Exam Vitals reviewed.  HENT:     Head: Normocephalic and atraumatic.  Eyes:     Pupils: Pupils are equal, round, and reactive to light.  Cardiovascular:     Rate and Rhythm: Normal rate and regular rhythm.     Heart sounds: Normal heart sounds.  Pulmonary:     Effort: Pulmonary effort is normal.     Breath sounds: Normal breath sounds.  Abdominal:     General: Bowel sounds are normal.     Palpations: Abdomen is soft.  Musculoskeletal:        General: No tenderness or deformity. Normal range of motion.     Cervical back: Normal range of motion.  Lymphadenopathy:     Cervical: No cervical adenopathy.  Skin:    General: Skin is warm and dry.     Findings: No erythema or rash.  Neurological:     Mental Status: He is alert and oriented to person, place, and time.  Psychiatric:        Behavior: Behavior normal.        Thought Content: Thought content normal.        Judgment: Judgment normal.      Lab Results  Component Value Date   WBC 10.7 (H) 08/18/2020   HGB 15.0 08/18/2020   HCT 46.7 08/18/2020   MCV 86.0 08/18/2020   PLT 291  08/18/2020   Lab Results  Component Value Date   FERRITIN 12 (L) 05/19/2020   IRON 64 05/19/2020   TIBC 363 05/19/2020   UIBC 298 05/19/2020   IRONPCTSAT 18 (L) 05/19/2020   Lab Results  Component Value Date   RETICCTPCT 2.0 12/07/2019   RBC 5.43 08/18/2020   RETICCTABS 77.3 06/20/2015   No results found for: KPAFRELGTCHN, LAMBDASER,  KAPLAMBRATIO No results found for: IGGSERUM, IGA, IGMSERUM No results found for: Kathrynn Ducking, MSPIKE, SPEI   Chemistry      Component Value Date/Time   NA 135 08/18/2020 1002   NA 142 07/18/2017 1421   NA 134 (L) 08/09/2016 1313   K 4.5 08/18/2020 1002   K 4.0 07/18/2017 1421   K 4.2 08/09/2016 1313   CL 102 08/18/2020 1002   CL 103 07/18/2017 1421   CO2 28 08/18/2020 1002   CO2 29 07/18/2017 1421   CO2 20 (L) 08/09/2016 1313   BUN 21 08/18/2020 1002   BUN 19 07/18/2017 1421   BUN 19.9 08/09/2016 1313   CREATININE 1.33 (H) 08/18/2020 1002   CREATININE 1.4 (H) 07/18/2017 1421   CREATININE 1.3 08/09/2016 1313      Component Value Date/Time   CALCIUM 10.5 (H) 08/18/2020 1002   CALCIUM 10.2 07/18/2017 1421   CALCIUM 10.0 08/09/2016 1313   ALKPHOS 62 08/18/2020 1002   ALKPHOS 65 07/18/2017 1421   ALKPHOS 75 08/09/2016 1313   AST 9 (L) 08/18/2020 1002   AST 14 08/09/2016 1313   ALT 10 08/18/2020 1002   ALT 21 07/18/2017 1421   ALT 23 08/09/2016 1313   BILITOT 0.4 08/18/2020 1002   BILITOT 0.34 08/09/2016 1313      Impression and Plan: Robert Tran is a 80 year old male. He has polycythemia. 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 However, phlebotomizing him definitely makes him feel better.  At this point, we need to set him up with a follow-up PET scan to see how everything looks.  We will set this up for the end of March  He does not need to be phlebotomized today.  I think we are okay holding off on a phlebotomy.  Hopefully, he will stop smoking again.  I know that he is  determined.  I will see him back myself in April.  We will see if he needs to be phlebotomized at that point.    Volanda Napoleon, MD 2/3/202210:57 AM

## 2020-08-18 NOTE — Telephone Encounter (Signed)
Spoke to patient of upcoming aptts - will view mychart - waiting on PET scan order

## 2020-08-19 LAB — IRON AND TIBC
Iron: 67 ug/dL (ref 42–163)
Saturation Ratios: 18 % — ABNORMAL LOW (ref 20–55)
TIBC: 368 ug/dL (ref 202–409)
UIBC: 301 ug/dL (ref 117–376)

## 2020-08-19 LAB — FERRITIN: Ferritin: 12 ng/mL — ABNORMAL LOW (ref 24–336)

## 2020-08-23 ENCOUNTER — Other Ambulatory Visit: Payer: Self-pay | Admitting: Hematology & Oncology

## 2020-08-23 DIAGNOSIS — C3411 Malignant neoplasm of upper lobe, right bronchus or lung: Secondary | ICD-10-CM

## 2020-09-28 ENCOUNTER — Ambulatory Visit (HOSPITAL_COMMUNITY)
Admission: RE | Admit: 2020-09-28 | Discharge: 2020-09-28 | Disposition: A | Discharge status: Home / Self Care | Payer: Medicare Other | Source: Ambulatory Visit | Attending: Hematology & Oncology | Admitting: Hematology & Oncology

## 2020-09-28 ENCOUNTER — Other Ambulatory Visit: Payer: Self-pay

## 2020-09-28 DIAGNOSIS — C3411 Malignant neoplasm of upper lobe, right bronchus or lung: Secondary | ICD-10-CM | POA: Insufficient documentation

## 2020-09-28 LAB — GLUCOSE, CAPILLARY: Glucose-Capillary: 161 mg/dL — ABNORMAL HIGH (ref 70–99)

## 2020-09-28 MED ORDER — FLUDEOXYGLUCOSE F - 18 (FDG) INJECTION
10.0000 | Freq: Once | INTRAVENOUS | Status: AC | PRN
  Administered 2020-09-28: 8.5 via INTRAVENOUS

## 2020-11-03 ENCOUNTER — Inpatient Hospital Stay: Payer: Medicare Other | Attending: Hematology & Oncology

## 2020-11-03 ENCOUNTER — Inpatient Hospital Stay (HOSPITAL_BASED_OUTPATIENT_CLINIC_OR_DEPARTMENT_OTHER): Payer: Medicare Other | Admitting: Hematology & Oncology

## 2020-11-03 ENCOUNTER — Inpatient Hospital Stay: Payer: Medicare Other

## 2020-11-03 ENCOUNTER — Other Ambulatory Visit: Payer: Self-pay

## 2020-11-03 ENCOUNTER — Telehealth: Payer: Self-pay

## 2020-11-03 ENCOUNTER — Encounter: Payer: Self-pay | Admitting: Hematology & Oncology

## 2020-11-03 VITALS — BP 122/80 | HR 84 | Resp 16

## 2020-11-03 VITALS — BP 135/48 | HR 87 | Temp 97.8°F | Resp 18 | Wt 170.0 lb

## 2020-11-03 DIAGNOSIS — D45 Polycythemia vera: Secondary | ICD-10-CM

## 2020-11-03 DIAGNOSIS — C3411 Malignant neoplasm of upper lobe, right bronchus or lung: Secondary | ICD-10-CM | POA: Diagnosis not present

## 2020-11-03 LAB — CMP (CANCER CENTER ONLY)
ALT: 11 U/L (ref 0–44)
AST: 10 U/L — ABNORMAL LOW (ref 15–41)
Albumin: 4.1 g/dL (ref 3.5–5.0)
Alkaline Phosphatase: 58 U/L (ref 38–126)
Anion gap: 8 (ref 5–15)
BUN: 23 mg/dL (ref 8–23)
CO2: 23 mmol/L (ref 22–32)
Calcium: 10.3 mg/dL (ref 8.9–10.3)
Chloride: 105 mmol/L (ref 98–111)
Creatinine: 1.19 mg/dL (ref 0.61–1.24)
GFR, Estimated: >60 mL/min (ref >60)
Glucose, Bld: 184 mg/dL — ABNORMAL HIGH (ref 70–99)
Potassium: 4.4 mmol/L (ref 3.5–5.1)
Sodium: 136 mmol/L (ref 135–145)
Total Bilirubin: 0.4 mg/dL (ref 0.3–1.2)
Total Protein: 6.6 g/dL (ref 6.5–8.1)

## 2020-11-03 LAB — CBC WITH DIFFERENTIAL (CANCER CENTER ONLY)
Abs Immature Granulocytes: 0.07 10*3/uL (ref 0.00–0.07)
Basophils Absolute: 0.1 10*3/uL (ref 0.0–0.1)
Basophils Relative: 1 %
Eosinophils Absolute: 0.3 10*3/uL (ref 0.0–0.5)
Eosinophils Relative: 4 %
HCT: 47.1 % (ref 39.0–52.0)
Hemoglobin: 15.4 g/dL (ref 13.0–17.0)
Immature Granulocytes: 1 %
Lymphocytes Relative: 19 %
Lymphs Abs: 1.6 10*3/uL (ref 0.7–4.0)
MCH: 28.9 pg (ref 26.0–34.0)
MCHC: 32.7 g/dL (ref 30.0–36.0)
MCV: 88.5 fL (ref 80.0–100.0)
Monocytes Absolute: 0.6 10*3/uL (ref 0.1–1.0)
Monocytes Relative: 7 %
Neutro Abs: 6.1 10*3/uL (ref 1.7–7.7)
Neutrophils Relative %: 68 %
Platelet Count: 276 10*3/uL (ref 150–400)
RBC: 5.32 MIL/uL (ref 4.22–5.81)
RDW: 15.8 % — ABNORMAL HIGH (ref 11.5–15.5)
WBC Count: 8.7 10*3/uL (ref 4.0–10.5)
nRBC: 0 % (ref 0.0–0.2)

## 2020-11-03 LAB — IRON AND TIBC
Iron: 67 ug/dL (ref 42–163)
Saturation Ratios: 18 % — ABNORMAL LOW (ref 20–55)
TIBC: 368 ug/dL (ref 202–409)
UIBC: 301 ug/dL (ref 117–376)

## 2020-11-03 LAB — LACTATE DEHYDROGENASE: LDH: 109 U/L (ref 98–192)

## 2020-11-03 LAB — FERRITIN: Ferritin: 15 ng/mL — ABNORMAL LOW (ref 24–336)

## 2020-11-03 NOTE — Patient Instructions (Signed)
Therapeutic Phlebotomy Therapeutic phlebotomy is the planned removal of blood from a person's body for the purpose of treating a medical condition. The procedure is similar to donating blood. Usually, about a pint (470 mL, or 0.47 L) of blood is removed. The average adult has 9-12 pints (4.3-5.7 L) of blood in the body. Therapeutic phlebotomy may be used to treat the following medical conditions:  Hemochromatosis. This is a condition in which the blood contains too much iron.  Polycythemia vera. This is a condition in which the blood contains too many red blood cells.  Porphyria cutanea tarda. This is a disease in which an important part of hemoglobin is not made properly. It results in the buildup of abnormal amounts of porphyrins in the body.  Sickle cell disease. This is a condition in which the red blood cells form an abnormal crescent shape rather than a round shape. Tell a health care provider about:  Any allergies you have.  All medicines you are taking, including vitamins, herbs, eye drops, creams, and over-the-counter medicines.  Any problems you or family members have had with anesthetic medicines.  Any blood disorders you have.  Any surgeries you have had.  Any medical conditions you have.  Whether you are pregnant or may be pregnant. What are the risks? Generally, this is a safe procedure. However, problems may occur, including:  Nausea or light-headedness.  Low blood pressure (hypotension).  Soreness, bleeding, swelling, or bruising at the needle insertion site.  Infection. What happens before the procedure?  Follow instructions from your health care provider about eating or drinking restrictions.  Ask your health care provider about: ? Changing or stopping your regular medicines. This is especially important if you are taking diabetes medicines or blood thinners (anticoagulants). ? Taking medicines such as aspirin and ibuprofen. These medicines can thin your  blood. Do not take these medicines unless your health care provider tells you to take them. ? Taking over-the-counter medicines, vitamins, herbs, and supplements.  Wear clothing with sleeves that can be raised above the elbow.  Plan to have someone take you home from the hospital or clinic.  You may have a blood sample taken.  Your blood pressure, pulse rate, and breathing rate will be measured. What happens during the procedure?  To lower your risk of infection: ? Your health care team will wash or sanitize their hands. ? Your skin will be cleaned with an antiseptic.  You may be given a medicine to numb the area (local anesthetic).  A tourniquet will be placed on your arm.  A needle will be inserted into one of your veins.  Tubing and a collection bag will be attached to that needle.  Blood will flow through the needle and tubing into the collection bag.  The collection bag will be placed lower than your arm to allow gravity to help the flow of blood into the bag.  You may be asked to open and close your hand slowly and continually during the entire collection.  After the specified amount of blood has been removed from your body, the collection bag and tubing will be clamped.  The needle will be removed from your vein.  Pressure will be held on the site of the needle insertion to stop the bleeding.  A bandage (dressing) will be placed over the needle insertion site. The procedure may vary among health care providers and hospitals.   What happens after the procedure?  Your blood pressure, pulse rate, and breathing rate will   be measured after the procedure.  You will be encouraged to drink fluids.  Your recovery will be assessed and monitored.  You can return to your normal activities as told by your health care provider. Summary  Therapeutic phlebotomy is the planned removal of blood from a person's body for the purpose of treating a medical condition.  Therapeutic  phlebotomy may be used to treat hemochromatosis, polycythemia vera, porphyria cutanea tarda, or sickle cell disease.  In the procedure, a needle is inserted and about a pint (470 mL, or 0.47 L) of blood is removed. The average adult has 9-12 pints (4.3-5.7 L) of blood in the body.  This is generally a safe procedure, but it can sometimes cause problems such as nausea, light-headedness, or low blood pressure (hypotension). This information is not intended to replace advice given to you by your health care provider. Make sure you discuss any questions you have with your health care provider. Document Revised: 07/18/2017 Document Reviewed: 07/18/2017 Elsevier Patient Education  2021 [employer]  

## 2020-11-03 NOTE — Progress Notes (Signed)
Robert Tran presents today for phlebotomy per MD orders. Phlebotomy procedure started at 1130 with 18 g angiocatheter and ended at 1145. 499 grams removed. Patient observed for 30 minutes after procedure without any incident. Patient tolerated procedure well. IV needle removed intact.

## 2020-11-03 NOTE — Telephone Encounter (Signed)
appts made per 11/03/20 los and pt will gain sch in tx/avs   Taffie Eckmann

## 2020-11-03 NOTE — Progress Notes (Signed)
Hematology and Oncology Follow Up Visit  Robert Tran [id number] 10-08-1940 80 y.o. 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  Overall, he is doing quite nicely.  He and his wife are getting ready to go down to the beach.  They have an RV that they travel on.  We did go ahead and do a PET scan on him.  This was done on March 16.  PET scan showed that he was still responding to the stereotactic radiosurgery.  He had decrease in the lung nodules.  Right upper lung nodule measured 1.3 x 1.1 cm.  A lingular nodule measured 1.2 cm.  He is still smoking.  He is trying to cut back.  He has had no back pain.  He is off of the back pain may been from the high dose of Lipitor that he was on.  He had a nice Easter holiday.  He is planning on going with his family to a house at the beach in July.  He has had no change in bowel or bladder habits.  He has had no cough or shortness of breath.  He has had no leg swelling.  He has had no rashes.  His iron studies back in February showed a ferritin of 12 with an iron saturation of 18%.  Overall, his performance status is ECOG 1.  Medications:  Allergies as of 11/03/2020      Reactions   Pravastatin Sodium Other (See Comments)   Patient stated,"it messed up my back and legs, I couldn't walk."   Minocycline Nausea Only   Sulfa Antibiotics Nausea Only   Sulfa Drugs Cross Reactors Nausea Only      Medication List       Accurate as of November 03, 2020 11:47 AM. If you have any questions, ask your nurse or doctor.        acetaminophen 500 MG tablet Commonly known as: TYLENOL Take 1,000 mg by mouth every 6 (six) hours as  needed for moderate pain or headache.   albuterol 108 (90 Base) MCG/ACT inhaler Commonly known as: VENTOLIN HFA Inhale 2 puffs into the lungs every 6 (six) hours as needed for wheezing or shortness of breath.   amLODipine 10 MG tablet Commonly known as: NORVASC Take 10 mg by mouth daily.   APPLE CIDER VINEGAR PO Take 15 mLs by mouth daily.   aspirin EC 81 MG tablet Take 81 mg by mouth daily.   atorvastatin 40 MG tablet Commonly known as: LIPITOR 20 mg daily.   clopidogrel 75 MG tablet Commonly known as: Plavix Take 1 tablet (75 mg total) by mouth daily. Okay to restart this medication on 02/20/2020.   fluocinonide 0.05 % external solution Commonly known as: LIDEX Apply 1 application topically every other day.   folic acid 093 MCG tablet Commonly known as: FOLVITE Take 400 mcg by mouth every morning.   glimepiride 1 MG tablet Commonly known as: AMARYL Take 1 mg by mouth every morning.   hydrocortisone 2.5 % cream 1 application daily as needed.   metFORMIN 500 MG 24 hr tablet Commonly known as: GLUCOPHAGE-XR Take 1,000 mg by mouth at bedtime.   metoprolol succinate 25 MG 24 hr tablet Commonly  known as: TOPROL-XL Take 25 mg by mouth daily.   nicotine 21 mg/24hr patch Commonly known as: NICODERM CQ - dosed in mg/24 hours Place onto the skin.   ramipril 10 MG tablet Commonly known as: ALTACE Take 10 mg by mouth daily.   tamsulosin 0.4 MG Caps capsule Commonly known as: FLOMAX Take 0.4 mg by mouth daily as needed (kidney stones).   torsemide 10 MG tablet Commonly known as: DEMADEX   Vitamin D 125 MCG (5000 UT) Caps Take 5,000 Units by mouth daily.       Allergies:  Allergies  Allergen Reactions  . Pravastatin Sodium Other (See Comments)    Patient stated,"it messed up my back and legs, I couldn't walk."  . Minocycline Nausea Only  . Sulfa Antibiotics Nausea Only  . Sulfa Drugs Cross Reactors Nausea Only    Past Medical History, Surgical history,  Social history, and Family History were reviewed and updated.  Review of Systems: Review of Systems  Constitutional: Negative.   HENT: Negative.   Eyes: Negative.   Respiratory: Negative.   Cardiovascular: Negative.   Gastrointestinal: Negative.   Genitourinary: Negative.   Musculoskeletal: Negative.   Skin: Negative.   Neurological: Negative.   Endo/Heme/Allergies: Negative.   Psychiatric/Behavioral: Negative.     Physical Exam:  weight is 170 lb 0.6 oz (77.1 kg). His oral temperature is 97.8 F (36.6 C). His blood pressure is 135/48 (abnormal) and his pulse is 87. His respiration is 18 and oxygen saturation is 98%.   Wt Readings from Last 3 Encounters:  11/03/20 170 lb 0.6 oz (77.1 kg)  08/18/20 171 lb 1.3 oz (77.6 kg)  06/06/20 170 lb (77.1 kg)    Physical Exam Vitals reviewed.  HENT:     Head: Normocephalic and atraumatic.  Eyes:     Pupils: Pupils are equal, round, and reactive to light.  Cardiovascular:     Rate and Rhythm: Normal rate and regular rhythm.     Heart sounds: Normal heart sounds.  Pulmonary:     Effort: Pulmonary effort is normal.     Breath sounds: Normal breath sounds.  Abdominal:     General: Bowel sounds are normal.     Palpations: Abdomen is soft.  Musculoskeletal:        General: No tenderness or deformity. Normal range of motion.     Cervical back: Normal range of motion.  Lymphadenopathy:     Cervical: No cervical adenopathy.  Skin:    General: Skin is warm and dry.     Findings: No erythema or rash.  Neurological:     Mental Status: He is alert and oriented to person, place, and time.  Psychiatric:        Behavior: Behavior normal.        Thought Content: Thought content normal.        Judgment: Judgment normal.      Lab Results  Component Value Date   WBC 8.7 11/03/2020   HGB 15.4 11/03/2020   HCT 47.1 11/03/2020   MCV 88.5 11/03/2020   PLT 276 11/03/2020   Lab Results  Component Value Date   FERRITIN 12 (L)  08/18/2020   IRON 67 08/18/2020   TIBC 368 08/18/2020   UIBC 301 08/18/2020   IRONPCTSAT 18 (L) 08/18/2020   Lab Results  Component Value Date   RETICCTPCT 2.0 12/07/2019   RBC 5.32 11/03/2020   RETICCTABS 77.3 06/20/2015   No results found for: KPAFRELGTCHN, LAMBDASER, KAPLAMBRATIO No results found for:  IGGSERUM, IGA, IGMSERUM No results found for: Kathrynn Ducking, MSPIKE, SPEI   Chemistry      Component Value Date/Time   NA 136 11/03/2020 0957   NA 142 07/18/2017 1421   NA 134 (L) 08/09/2016 1313   K 4.4 11/03/2020 0957   K 4.0 07/18/2017 1421   K 4.2 08/09/2016 1313   CL 105 11/03/2020 0957   CL 103 07/18/2017 1421   CO2 23 11/03/2020 0957   CO2 29 07/18/2017 1421   CO2 20 (L) 08/09/2016 1313   BUN 23 11/03/2020 0957   BUN 19 07/18/2017 1421   BUN 19.9 08/09/2016 1313   CREATININE 1.19 11/03/2020 0957   CREATININE 1.4 (H) 07/18/2017 1421   CREATININE 1.3 08/09/2016 1313      Component Value Date/Time   CALCIUM 10.3 11/03/2020 0957   CALCIUM 10.2 07/18/2017 1421   CALCIUM 10.0 08/09/2016 1313   ALKPHOS 58 11/03/2020 0957   ALKPHOS 65 07/18/2017 1421   ALKPHOS 75 08/09/2016 1313   AST 10 (L) 11/03/2020 0957   AST 14 08/09/2016 1313   ALT 11 11/03/2020 0957   ALT 21 07/18/2017 1421   ALT 23 08/09/2016 1313   BILITOT 0.4 11/03/2020 0957   BILITOT 0.34 08/09/2016 1313      Impression and Plan: Robert Tran is a 80 year old male. He has polycythemia. 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 However, phlebotomizing him definitely makes him feel better.  The more active problem is the lung cancer.  He underwent radiosurgery for this.  He is responding to about the PET scan.  I think we can now do another PET scan on him in about 6 months.  We will phlebotomize him today.  I would like to try to get him through the summertime and have him come back in September.  I think this would be very reasonable.   Volanda Napoleon,  MD 4/21/202211:47 AM

## 2021-03-15 ENCOUNTER — Other Ambulatory Visit: Payer: Self-pay

## 2021-03-15 ENCOUNTER — Ambulatory Visit (HOSPITAL_COMMUNITY)
Admission: RE | Admit: 2021-03-15 | Discharge: 2021-03-15 | Disposition: A | Discharge status: Home / Self Care | Payer: Medicare Other | Source: Ambulatory Visit | Attending: Hematology & Oncology | Admitting: Hematology & Oncology

## 2021-03-15 DIAGNOSIS — C3411 Malignant neoplasm of upper lobe, right bronchus or lung: Secondary | ICD-10-CM | POA: Diagnosis not present

## 2021-03-15 DIAGNOSIS — Z79899 Other long term (current) drug therapy: Secondary | ICD-10-CM | POA: Diagnosis not present

## 2021-03-15 LAB — GLUCOSE, CAPILLARY: Glucose-Capillary: 162 mg/dL — ABNORMAL HIGH (ref 70–99)

## 2021-03-15 MED ORDER — FLUDEOXYGLUCOSE F - 18 (FDG) INJECTION
8.5000 | Freq: Once | INTRAVENOUS | Status: AC | PRN
  Administered 2021-03-15: 8.4 via INTRAVENOUS

## 2021-03-16 ENCOUNTER — Inpatient Hospital Stay: Payer: Medicare Other

## 2021-03-16 ENCOUNTER — Inpatient Hospital Stay (HOSPITAL_BASED_OUTPATIENT_CLINIC_OR_DEPARTMENT_OTHER): Payer: Medicare Other | Admitting: Hematology & Oncology

## 2021-03-16 ENCOUNTER — Telehealth: Payer: Self-pay

## 2021-03-16 ENCOUNTER — Encounter: Payer: Self-pay | Admitting: Hematology & Oncology

## 2021-03-16 ENCOUNTER — Inpatient Hospital Stay: Payer: Medicare Other | Attending: Hematology & Oncology

## 2021-03-16 VITALS — BP 138/78 | HR 78 | Resp 18

## 2021-03-16 VITALS — BP 137/70 | HR 117 | Temp 97.8°F | Resp 20 | Wt 166.1 lb

## 2021-03-16 DIAGNOSIS — R918 Other nonspecific abnormal finding of lung field: Secondary | ICD-10-CM | POA: Insufficient documentation

## 2021-03-16 DIAGNOSIS — C3411 Malignant neoplasm of upper lobe, right bronchus or lung: Secondary | ICD-10-CM

## 2021-03-16 DIAGNOSIS — D45 Polycythemia vera: Secondary | ICD-10-CM

## 2021-03-16 LAB — CMP (CANCER CENTER ONLY)
ALT: 10 U/L (ref 0–44)
AST: 9 U/L — ABNORMAL LOW (ref 15–41)
Albumin: 3.9 g/dL (ref 3.5–5.0)
Alkaline Phosphatase: 67 U/L (ref 38–126)
Anion gap: 8 (ref 5–15)
BUN: 25 mg/dL — ABNORMAL HIGH (ref 8–23)
CO2: 25 mmol/L (ref 22–32)
Calcium: 10.1 mg/dL (ref 8.9–10.3)
Chloride: 105 mmol/L (ref 98–111)
Creatinine: 1.42 mg/dL — ABNORMAL HIGH (ref 0.61–1.24)
GFR, Estimated: 50 mL/min — ABNORMAL LOW (ref >60)
Glucose, Bld: 212 mg/dL — ABNORMAL HIGH (ref 70–99)
Potassium: 4.1 mmol/L (ref 3.5–5.1)
Sodium: 138 mmol/L (ref 135–145)
Total Bilirubin: 0.5 mg/dL (ref 0.3–1.2)
Total Protein: 6.1 g/dL — ABNORMAL LOW (ref 6.5–8.1)

## 2021-03-16 LAB — IRON AND TIBC
Iron: 90 ug/dL (ref 42–163)
Saturation Ratios: 26 % (ref 20–55)
TIBC: 350 ug/dL (ref 202–409)
UIBC: 260 ug/dL (ref 117–376)

## 2021-03-16 LAB — CBC WITH DIFFERENTIAL (CANCER CENTER ONLY)
Abs Immature Granulocytes: 0.15 10*3/uL — ABNORMAL HIGH (ref 0.00–0.07)
Basophils Absolute: 0.1 10*3/uL (ref 0.0–0.1)
Basophils Relative: 1 %
Eosinophils Absolute: 0.3 10*3/uL (ref 0.0–0.5)
Eosinophils Relative: 3 %
HCT: 47.8 % (ref 39.0–52.0)
Hemoglobin: 15.6 g/dL (ref 13.0–17.0)
Immature Granulocytes: 2 %
Lymphocytes Relative: 17 %
Lymphs Abs: 1.6 10*3/uL (ref 0.7–4.0)
MCH: 29.8 pg (ref 26.0–34.0)
MCHC: 32.6 g/dL (ref 30.0–36.0)
MCV: 91.2 fL (ref 80.0–100.0)
Monocytes Absolute: 0.6 10*3/uL (ref 0.1–1.0)
Monocytes Relative: 6 %
Neutro Abs: 6.7 10*3/uL (ref 1.7–7.7)
Neutrophils Relative %: 71 %
Platelet Count: 289 10*3/uL (ref 150–400)
RBC: 5.24 MIL/uL (ref 4.22–5.81)
RDW: 14.9 % (ref 11.5–15.5)
WBC Count: 9.4 10*3/uL (ref 4.0–10.5)
nRBC: 0 % (ref 0.0–0.2)

## 2021-03-16 LAB — RETICULOCYTES
Immature Retic Fract: 22 % — ABNORMAL HIGH (ref 2.3–15.9)
RBC.: 5.22 MIL/uL (ref 4.22–5.81)
Retic Count, Absolute: 104.9 10*3/uL (ref 19.0–186.0)
Retic Ct Pct: 2 % (ref 0.4–3.1)

## 2021-03-16 LAB — FERRITIN: Ferritin: 13 ng/mL — ABNORMAL LOW (ref 24–336)

## 2021-03-16 MED ORDER — SODIUM CHLORIDE 0.9 % IV SOLN: INTRAVENOUS | Status: AC

## 2021-03-16 NOTE — Progress Notes (Signed)
Hematology and Oncology Follow Up Visit  Robert Tran [id number] [birth year] 80 y.o. 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  He is doing quite well.  He comes in with his wife.  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  I am sure that they will be quite successful doing this.  He did have a PET scan done.  This was done on 03/15/2021.  This showed decrease in right pulmonary nodules.  There was a slight increase in a lingular nodule.  I am not sure exactly what this exactly means.  To follow-up with another scan probably in about 3 months.  Is elevated for cough.  Is still smoking.  He also has a polycythemia.  He will need to be phlebotomized today.  He has had no problems with nausea or vomiting.  There has been no change in bowel or bladder habits.  He has had no issues with fever.  He has had no rashes.  There is been no leg swelling.  Overall, his performance status is ECOG 1.    Medications:  Allergies as of 03/16/2021       Reactions   Pravastatin Sodium Other (See Comments)   Patient stated,"it messed up my back and legs, I couldn't walk."   Minocycline Nausea Only   Sulfa Antibiotics Nausea Only   Sulfa Drugs Cross Reactors Nausea Only        Medication List        Accurate as of March 16, 2021 11:29 AM. If you have any questions, ask your nurse or doctor.          acetaminophen 500 MG tablet Commonly known as: TYLENOL Take 1,000 mg by mouth every 6 (six) hours as needed for moderate pain or headache.   albuterol 108 (90 Base) MCG/ACT inhaler Commonly known as: VENTOLIN HFA Inhale 2 puffs into the lungs every 6  (six) hours as needed for wheezing or shortness of breath.   amLODipine 10 MG tablet Commonly known as: NORVASC Take 10 mg by mouth daily.   APPLE CIDER VINEGAR PO Take 15 mLs by mouth daily.   aspirin EC 81 MG tablet Take 81 mg by mouth daily.   atorvastatin 40 MG tablet Commonly known as: LIPITOR 20 mg daily.   clopidogrel 75 MG tablet Commonly known as: Plavix Take 1 tablet (75 mg total) by mouth daily. Okay to restart this medication on 02/20/2020.   fluocinonide 0.05 % external solution Commonly known as: LIDEX Apply 1 application topically every other day.   folic acid 623 MCG tablet Commonly known as: FOLVITE Take 400 mcg by mouth every morning.   glimepiride 1 MG tablet Commonly known as: AMARYL Take 1 mg by mouth every morning.   hydrocortisone 2.5 % cream 1 application daily as needed.   metFORMIN 500 MG 24 hr tablet Commonly known as: GLUCOPHAGE-XR Take 1,000 mg by mouth at bedtime.   metoprolol succinate 25 MG 24 hr tablet Commonly known as: TOPROL-XL Take 25 mg by mouth daily.   nicotine 21 mg/24hr patch Commonly known as: NICODERM CQ - dosed in mg/24 hours Place onto the skin.   ramipril 10 MG tablet Commonly known as:  ALTACE Take 10 mg by mouth daily.   tamsulosin 0.4 MG Caps capsule Commonly known as: FLOMAX Take 0.4 mg by mouth daily as needed (kidney stones).   torsemide 10 MG tablet Commonly known as: DEMADEX   Vitamin D 125 MCG (5000 UT) Caps Take 5,000 Units by mouth daily.        Allergies:  Allergies  Allergen Reactions   Pravastatin Sodium Other (See Comments)    Patient stated,"it messed up my back and legs, I couldn't walk."   Minocycline Nausea Only   Sulfa Antibiotics Nausea Only   Sulfa Drugs Cross Reactors Nausea Only    Past Medical History, Surgical history, Social history, and Family History were reviewed and updated.  Review of Systems: Review of Systems  Constitutional: Negative.   HENT: Negative.     Eyes: Negative.   Respiratory: Negative.    Cardiovascular: Negative.   Gastrointestinal: Negative.   Genitourinary: Negative.   Musculoskeletal: Negative.   Skin: Negative.   Neurological: Negative.   Endo/Heme/Allergies: Negative.   Psychiatric/Behavioral: Negative.     Physical Exam:  weight is 166 lb 1.9 oz (75.4 kg). His oral temperature is 97.8 F (36.6 C). His blood pressure is 137/70 and his pulse is 117 (abnormal). His respiration is 20 and oxygen saturation is 99%.   Wt Readings from Last 3 Encounters:  03/16/21 166 lb 1.9 oz (75.4 kg)  11/03/20 170 lb 0.6 oz (77.1 kg)  08/18/20 171 lb 1.3 oz (77.6 kg)    Physical Exam Vitals reviewed.  HENT:     Head: Normocephalic and atraumatic.  Eyes:     Pupils: Pupils are equal, round, and reactive to light.  Cardiovascular:     Rate and Rhythm: Normal rate and regular rhythm.     Heart sounds: Normal heart sounds.  Pulmonary:     Effort: Pulmonary effort is normal.     Breath sounds: Normal breath sounds.  Abdominal:     General: Bowel sounds are normal.     Palpations: Abdomen is soft.  Musculoskeletal:        General: No tenderness or deformity. Normal range of motion.     Cervical back: Normal range of motion.  Lymphadenopathy:     Cervical: No cervical adenopathy.  Skin:    General: Skin is warm and dry.     Findings: No erythema or rash.  Neurological:     Mental Status: He is alert and oriented to person, place, and time.  Psychiatric:        Behavior: Behavior normal.        Thought Content: Thought content normal.        Judgment: Judgment normal.     Lab Results  Component Value Date   WBC 9.4 03/16/2021   HGB 15.6 03/16/2021   HCT 47.8 03/16/2021   MCV 91.2 03/16/2021   PLT 289 03/16/2021   Lab Results  Component Value Date   FERRITIN 15 (L) 11/03/2020   IRON 67 11/03/2020   TIBC 368 11/03/2020   UIBC 301 11/03/2020   IRONPCTSAT 18 (L) 11/03/2020   Lab Results  Component Value Date    RETICCTPCT 2.0 03/16/2021   RBC 5.22 03/16/2021   RBC 5.24 03/16/2021   RETICCTABS 77.3 06/20/2015   No results found for: KPAFRELGTCHN, LAMBDASER, KAPLAMBRATIO No results found for: IGGSERUM, IGA, IGMSERUM No results found for: TOTALPROTELP, ALBUMINELP, A1GS, A2GS, BETS, BETA2SER, GAMS, MSPIKE, SPEI   Chemistry      Component Value Date/Time  NA 138 03/16/2021 1009   NA 142 07/18/2017 1421   NA 134 (L) 08/09/2016 1313   K 4.1 03/16/2021 1009   K 4.0 07/18/2017 1421   K 4.2 08/09/2016 1313   CL 105 03/16/2021 1009   CL 103 07/18/2017 1421   CO2 25 03/16/2021 1009   CO2 29 07/18/2017 1421   CO2 20 (L) 08/09/2016 1313   BUN 25 (H) 03/16/2021 1009   BUN 19 07/18/2017 1421   BUN 19.9 08/09/2016 1313   CREATININE 1.42 (H) 03/16/2021 1009   CREATININE 1.4 (H) 07/18/2017 1421   CREATININE 1.3 08/09/2016 1313      Component Value Date/Time   CALCIUM 10.1 03/16/2021 1009   CALCIUM 10.2 07/18/2017 1421   CALCIUM 10.0 08/09/2016 1313   ALKPHOS 67 03/16/2021 1009   ALKPHOS 65 07/18/2017 1421   ALKPHOS 75 08/09/2016 1313   AST 9 (L) 03/16/2021 1009   AST 14 08/09/2016 1313   ALT 10 03/16/2021 1009   ALT 21 07/18/2017 1421   ALT 23 08/09/2016 1313   BILITOT 0.5 03/16/2021 1009   BILITOT 0.34 08/09/2016 1313      Impression and Plan: Robert Tran is a 80 year old male. He has polycythemia. 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 However, phlebotomizing him definitely makes him feel better.  Again, I am not sure what to make of the PET scan report.  We will have to follow-up with another PET scan I think in 3 months to see was going on with the lingula.  If there is growth, I am sure he could have radiosurgery for this.  We will go ahead and phlebotomize him.  He will get IV fluids afterward.  This tends to help decrease side effects that he has.  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    Volanda Napoleon,  MD 9/1/202211:29 AM

## 2021-03-16 NOTE — Progress Notes (Signed)
Robert Tran presents today for phlebotomy per MD orders. Phlebotomy procedure started at 1140 and ended at 1150. 510 grams removed via 18 gauge needle to LEFT AC. Patient observed for 30 minutes after procedure without any incident while receiving fluids. Pt given snacks during and after procedure. Patient tolerated procedure well.

## 2021-03-16 NOTE — Patient Instructions (Signed)
Therapeutic Phlebotomy Therapeutic phlebotomy is the planned removal of blood from a person's body for the purpose of treating a medical condition. The procedure is similar to donating blood. Usually, about a pint (470 mL, or 0.47 L) of blood is removed. The average adult has 9-12 pints (4.3-5.7 L) of blood in the body. Therapeutic phlebotomy may be used to treat the following medical conditions: Hemochromatosis. This is a condition in which the blood contains too much iron. Polycythemia vera. This is a condition in which the blood contains too many red blood cells. Porphyria cutanea tarda. This is a disease in which an important part of hemoglobin is not made properly. It results in the buildup of abnormal amounts of porphyrins in the body. Sickle cell disease. This is a condition in which the red blood cells form an abnormal crescent shape rather than a round shape. Tell a health care provider about: Any allergies you have. All medicines you are taking, including vitamins, herbs, eye drops, creams, and over-the-counter medicines. Any problems you or family members have had with anesthetic medicines. Any blood disorders you have. Any surgeries you have had. Any medical conditions you have. Whether you are pregnant or may be pregnant. What are the risks? Generally, this is a safe procedure. However, problems may occur, including: Nausea or light-headedness. Low blood pressure (hypotension). Soreness, bleeding, swelling, or bruising at the needle insertion site. Infection. What happens before the procedure? Follow instructions from your health care provider about eating or drinking restrictions. Ask your health care provider about: Changing or stopping your regular medicines. This is especially important if you are taking diabetes medicines or blood thinners (anticoagulants). Taking medicines such as aspirin and ibuprofen. These medicines can thin your blood. Do not take these medicines  unless your health care provider tells you to take them. Taking over-the-counter medicines, vitamins, herbs, and supplements. Wear clothing with sleeves that can be raised above the elbow. Plan to have someone take you home from the hospital or clinic. You may have a blood sample taken. Your blood pressure, pulse rate, and breathing rate will be measured. What happens during the procedure?  To lower your risk of infection: Your health care team will wash or sanitize their hands. Your skin will be cleaned with an antiseptic. You may be given a medicine to numb the area (local anesthetic). A tourniquet will be placed on your arm. A needle will be inserted into one of your veins. Tubing and a collection bag will be attached to that needle. Blood will flow through the needle and tubing into the collection bag. The collection bag will be placed lower than your arm to allow gravity to help the flow of blood into the bag. You may be asked to open and close your hand slowly and continually during the entire collection. After the specified amount of blood has been removed from your body, the collection bag and tubing will be clamped. The needle will be removed from your vein. Pressure will be held on the site of the needle insertion to stop the bleeding. A bandage (dressing) will be placed over the needle insertion site. The procedure may vary among health care providers and hospitals. What happens after the procedure? Your blood pressure, pulse rate, and breathing rate will be measured after the procedure. You will be encouraged to drink fluids. Your recovery will be assessed and monitored. You can return to your normal activities as told by your health care provider. Summary Therapeutic phlebotomy is the planned removal   of blood from a person's body for the purpose of treating a medical condition. Therapeutic phlebotomy may be used to treat hemochromatosis, polycythemia vera, porphyria cutanea  tarda, or sickle cell disease. In the procedure, a needle is inserted and about a pint (470 mL, or 0.47 L) of blood is removed. The average adult has 9-12 pints (4.3-5.7 L) of blood in the body. This is generally a safe procedure, but it can sometimes cause problems such as nausea, light-headedness, or low blood pressure (hypotension). This information is not intended to replace advice given to you by your health care provider. Make sure you discuss any questions you have with your health care provider. Document Revised: 10/11/2020 Document Reviewed: 07/18/2017 Elsevier Patient Education  2022 [employer]  

## 2021-03-16 NOTE — Telephone Encounter (Signed)
Appts made and printed for pt per 03/16/21 los  [employer]

## 2021-06-16 ENCOUNTER — Ambulatory Visit (HOSPITAL_COMMUNITY)
Admission: RE | Admit: 2021-06-16 | Discharge: 2021-06-16 | Disposition: A | Discharge status: Home / Self Care | Payer: Medicare Other | Source: Ambulatory Visit | Attending: Hematology & Oncology | Admitting: Hematology & Oncology

## 2021-06-16 DIAGNOSIS — C3411 Malignant neoplasm of upper lobe, right bronchus or lung: Secondary | ICD-10-CM | POA: Diagnosis present

## 2021-06-16 LAB — GLUCOSE, CAPILLARY: Glucose-Capillary: 137 mg/dL — ABNORMAL HIGH (ref 70–99)

## 2021-06-16 MED ORDER — FLUDEOXYGLUCOSE F - 18 (FDG) INJECTION
8.1000 | Freq: Once | INTRAVENOUS | Status: AC
  Administered 2021-06-16: 8.1 via INTRAVENOUS

## 2021-06-20 ENCOUNTER — Other Ambulatory Visit: Payer: Self-pay

## 2021-06-20 ENCOUNTER — Inpatient Hospital Stay: Payer: Medicare Other

## 2021-06-20 ENCOUNTER — Encounter: Payer: Self-pay | Admitting: Hematology & Oncology

## 2021-06-20 ENCOUNTER — Inpatient Hospital Stay (HOSPITAL_BASED_OUTPATIENT_CLINIC_OR_DEPARTMENT_OTHER): Payer: Medicare Other | Admitting: Hematology & Oncology

## 2021-06-20 ENCOUNTER — Inpatient Hospital Stay: Payer: Medicare Other | Attending: Hematology & Oncology

## 2021-06-20 VITALS — BP 142/81 | HR 83 | Resp 18

## 2021-06-20 VITALS — BP 146/70 | HR 93 | Temp 97.8°F | Resp 18 | Wt 164.0 lb

## 2021-06-20 DIAGNOSIS — Z882 Allergy status to sulfonamides status: Secondary | ICD-10-CM | POA: Diagnosis not present

## 2021-06-20 DIAGNOSIS — F17210 Nicotine dependence, cigarettes, uncomplicated: Secondary | ICD-10-CM | POA: Insufficient documentation

## 2021-06-20 DIAGNOSIS — Z79899 Other long term (current) drug therapy: Secondary | ICD-10-CM | POA: Insufficient documentation

## 2021-06-20 DIAGNOSIS — D45 Polycythemia vera: Secondary | ICD-10-CM | POA: Insufficient documentation

## 2021-06-20 DIAGNOSIS — C3411 Malignant neoplasm of upper lobe, right bronchus or lung: Secondary | ICD-10-CM | POA: Diagnosis not present

## 2021-06-20 DIAGNOSIS — Z7902 Long term (current) use of antithrombotics/antiplatelets: Secondary | ICD-10-CM | POA: Diagnosis not present

## 2021-06-20 LAB — CBC WITH DIFFERENTIAL (CANCER CENTER ONLY)
Abs Immature Granulocytes: 0.15 10*3/uL — ABNORMAL HIGH (ref 0.00–0.07)
Basophils Absolute: 0.1 10*3/uL (ref 0.0–0.1)
Basophils Relative: 1 %
Eosinophils Absolute: 0.5 10*3/uL (ref 0.0–0.5)
Eosinophils Relative: 5 %
HCT: 49 % (ref 39.0–52.0)
Hemoglobin: 16.1 g/dL (ref 13.0–17.0)
Immature Granulocytes: 1 %
Lymphocytes Relative: 17 %
Lymphs Abs: 1.8 10*3/uL (ref 0.7–4.0)
MCH: 30.1 pg (ref 26.0–34.0)
MCHC: 32.9 g/dL (ref 30.0–36.0)
MCV: 91.6 fL (ref 80.0–100.0)
Monocytes Absolute: 0.6 10*3/uL (ref 0.1–1.0)
Monocytes Relative: 6 %
Neutro Abs: 7.2 10*3/uL (ref 1.7–7.7)
Neutrophils Relative %: 70 %
Platelet Count: 321 10*3/uL (ref 150–400)
RBC: 5.35 MIL/uL (ref 4.22–5.81)
RDW: 13.7 % (ref 11.5–15.5)
WBC Count: 10.4 10*3/uL (ref 4.0–10.5)
nRBC: 0 % (ref 0.0–0.2)

## 2021-06-20 LAB — CMP (CANCER CENTER ONLY)
ALT: 13 U/L (ref 0–44)
AST: 9 U/L — ABNORMAL LOW (ref 15–41)
Albumin: 4 g/dL (ref 3.5–5.0)
Alkaline Phosphatase: 66 U/L (ref 38–126)
Anion gap: 7 (ref 5–15)
BUN: 22 mg/dL (ref 8–23)
CO2: 28 mmol/L (ref 22–32)
Calcium: 10.7 mg/dL — ABNORMAL HIGH (ref 8.9–10.3)
Chloride: 102 mmol/L (ref 98–111)
Creatinine: 1.16 mg/dL (ref 0.61–1.24)
GFR, Estimated: >60 mL/min (ref >60)
Glucose, Bld: 196 mg/dL — ABNORMAL HIGH (ref 70–99)
Potassium: 4.7 mmol/L (ref 3.5–5.1)
Sodium: 137 mmol/L (ref 135–145)
Total Bilirubin: 0.4 mg/dL (ref 0.3–1.2)
Total Protein: 6.7 g/dL (ref 6.5–8.1)

## 2021-06-20 LAB — IRON AND TIBC
Iron: 57 ug/dL (ref 42–163)
Saturation Ratios: 19 % — ABNORMAL LOW (ref 20–55)
TIBC: 300 ug/dL (ref 202–409)
UIBC: 243 ug/dL (ref 117–376)

## 2021-06-20 LAB — LACTATE DEHYDROGENASE: LDH: 106 U/L (ref 98–192)

## 2021-06-20 LAB — FERRITIN: Ferritin: 41 ng/mL (ref 24–336)

## 2021-06-20 MED ORDER — SODIUM CHLORIDE 0.9 % IV SOLN: INTRAVENOUS | Status: AC

## 2021-06-20 NOTE — Patient Instructions (Signed)
Therapeutic Phlebotomy Therapeutic phlebotomy is the planned removal of blood from a person's body for the purpose of treating a medical condition. The procedure is similar to donating blood. Usually, about a pint (470 mL, or 0.47 L) of blood is removed. The average adult has 9-12 pints (4.3-5.7 L) of blood in the body. Therapeutic phlebotomy may be used to treat the following medical conditions: Hemochromatosis. This is a condition in which the blood contains too much iron. Polycythemia vera. This is a condition in which the blood contains too many red blood cells. Porphyria cutanea tarda. This is a disease in which an important part of hemoglobin is not made properly. It results in the buildup of abnormal amounts of porphyrins in the body. Sickle cell disease. This is a condition in which the red blood cells form an abnormal crescent shape rather than a round shape. Tell a health care provider about: Any allergies you have. All medicines you are taking, including vitamins, herbs, eye drops, creams, and over-the-counter medicines. Any problems you or family members have had with anesthetic medicines. Any blood disorders you have. Any surgeries you have had. Any medical conditions you have. Whether you are pregnant or may be pregnant. What are the risks? Generally, this is a safe procedure. However, problems may occur, including: Nausea or light-headedness. Low blood pressure (hypotension). Soreness, bleeding, swelling, or bruising at the needle insertion site. Infection. What happens before the procedure? Follow instructions from your health care provider about eating or drinking restrictions. Ask your health care provider about: Changing or stopping your regular medicines. This is especially important if you are taking diabetes medicines or blood thinners (anticoagulants). Taking medicines such as aspirin and ibuprofen. These medicines can thin your blood. Do not take these medicines  unless your health care provider tells you to take them. Taking over-the-counter medicines, vitamins, herbs, and supplements. Wear clothing with sleeves that can be raised above the elbow. Plan to have someone take you home from the hospital or clinic. You may have a blood sample taken. Your blood pressure, pulse rate, and breathing rate will be measured. What happens during the procedure?  To lower your risk of infection: Your health care team will wash or sanitize their hands. Your skin will be cleaned with an antiseptic. You may be given a medicine to numb the area (local anesthetic). A tourniquet will be placed on your arm. A needle will be inserted into one of your veins. Tubing and a collection bag will be attached to that needle. Blood will flow through the needle and tubing into the collection bag. The collection bag will be placed lower than your arm to allow gravity to help the flow of blood into the bag. You may be asked to open and close your hand slowly and continually during the entire collection. After the specified amount of blood has been removed from your body, the collection bag and tubing will be clamped. The needle will be removed from your vein. Pressure will be held on the site of the needle insertion to stop the bleeding. A bandage (dressing) will be placed over the needle insertion site. The procedure may vary among health care providers and hospitals. What happens after the procedure? Your blood pressure, pulse rate, and breathing rate will be measured after the procedure. You will be encouraged to drink fluids. Your recovery will be assessed and monitored. You can return to your normal activities as told by your health care provider. Summary Therapeutic phlebotomy is the planned removal  of blood from a person's body for the purpose of treating a medical condition. Therapeutic phlebotomy may be used to treat hemochromatosis, polycythemia vera, porphyria cutanea  tarda, or sickle cell disease. In the procedure, a needle is inserted and about a pint (470 mL, or 0.47 L) of blood is removed. The average adult has 9-12 pints (4.3-5.7 L) of blood in the body. This is generally a safe procedure, but it can sometimes cause problems such as nausea, light-headedness, or low blood pressure (hypotension). This information is not intended to replace advice given to you by your health care provider. Make sure you discuss any questions you have with your health care provider. Document Revised: 10/11/2020 Document Reviewed: 07/18/2017 Elsevier Patient Education  2022 [employer]. Rehydration, Adult Rehydration is the replacement of body fluids, salts, and minerals (electrolytes) that are lost during dehydration. Dehydration is when there is not enough water or other fluids in the body. This happens when you lose more fluids than you take in. Common causes of dehydration include: Not drinking enough fluids. This can occur when you are ill or doing activities that require a lot of energy, especially in hot weather. Conditions that cause loss of water or other fluids, such as diarrhea, vomiting, sweating, or urinating a lot. Other illnesses, such as fever or infection. Certain medicines, such as those that remove excess fluid from the body (diuretics). Symptoms of mild or moderate dehydration may include thirst, dry lips and mouth, and dizziness. Symptoms of severe dehydration may include increased heart rate, confusion, fainting, and not urinating. For severe dehydration, you may need to get fluids through an IV at the hospital. For mild or moderate dehydration, you can usually rehydrate at home by drinking certain fluids as told by your health care provider. What are the risks? Generally, rehydration is safe. However, taking in too much fluid (overhydration) can be a problem. This is rare. Overhydration can cause an electrolyte imbalance, kidney failure, or a decrease in  salt (sodium) levels in the body. Supplies needed You will need an oral rehydration solution (ORS) if your health care provider tells you to use one. This is a drink to treat dehydration. It can be found in pharmacies and retail stores. How to rehydrate Fluids Follow instructions from your health care provider for rehydration. The kind of fluid and the amount you should drink depend on your condition. In general, you should choose drinks that you prefer. If told by your health care provider, drink an ORS. Make an ORS by following instructions on the package. Start by drinking small amounts, about  cup (120 mL) every 5-10 minutes. Slowly increase how much you drink until you have taken the amount recommended by your health care provider. Drink enough clear fluids to keep your urine pale yellow. If you were told to drink an ORS, finish it first, then start slowly drinking other clear fluids. Drink fluids such as: Water. This includes sparkling water and flavored water. Drinking only water can lead to having too little sodium in your body (hyponatremia). Follow the advice of your health care provider. Water from ice chips you suck on. Fruit juice with water you add to it (diluted). Sports drinks. Hot or cold herbal teas. Broth-based soups. Milk or milk products. Food Follow instructions from your health care provider about what to eat while you rehydrate. Your health care provider may recommend that you slowly begin eating regular foods in small amounts. Eat foods that contain a healthy balance of electrolytes, such as  bananas, oranges, potatoes, tomatoes, and spinach. Avoid foods that are greasy or contain a lot of sugar. In some cases, you may get nutrition through a feeding tube that is passed through your nose and into your stomach (nasogastric tube, or NG tube). This may be done if you have uncontrolled vomiting or diarrhea. Beverages to avoid Certain beverages may make dehydration worse.  While you rehydrate, avoid drinking alcohol. How to tell if you are recovering from dehydration You may be recovering from dehydration if: You are urinating more often than before you started rehydrating. Your urine is pale yellow. Your energy level improves. You vomit less frequently. You have diarrhea less frequently. Your appetite improves or returns to normal. You feel less dizzy or less light-headed. Your skin tone and color start to look more normal. Follow these instructions at home: Take over-the-counter and prescription medicines only as told by your health care provider. Do not take sodium tablets. Doing this can lead to having too much sodium in your body (hypernatremia). Contact a health care provider if: You continue to have symptoms of mild or moderate dehydration, such as: Thirst. Dry lips. Slightly dry mouth. Dizziness. Dark urine or less urine than normal. Muscle cramps. You continue to vomit or have diarrhea. Get help right away if you: Have symptoms of dehydration that get worse. Have a fever. Have a severe headache. Have been vomiting and the following happens: Your vomiting gets worse or does not go away. Your vomit includes blood or green matter (bile). You cannot eat or drink without vomiting. Have problems with urination or bowel movements, such as: Diarrhea that gets worse or does not go away. Blood in your stool (feces). This may cause stool to look black and tarry. Not urinating, or urinating only a small amount of very dark urine, within 6-8 hours. Have trouble breathing. Have symptoms that get worse with treatment. These symptoms may represent a serious problem that is an emergency. Do not wait to see if the symptoms will go away. Get medical help right away. Call your local emergency services (911 in the [place] Do not drive yourself to the hospital. Summary Rehydration is the replacement of body fluids and minerals (electrolytes) that are lost  during dehydration. Follow instructions from your health care provider for rehydration. The kind of fluid and amount you should drink depend on your condition. Slowly increase how much you drink until you have taken the amount recommended by your health care provider. Contact your health care provider if you continue to show signs of mild or moderate dehydration. This information is not intended to replace advice given to you by your health care provider. Make sure you discuss any questions you have with your health care provider. Document Revised: 09/02/2019 Document Reviewed: 07/13/2019 Elsevier Patient Education  2022 [employer].

## 2021-06-20 NOTE — Progress Notes (Signed)
Hematology and Oncology Follow Up Visit  Robert Tran [id number] 07-07-1941 80 y.o. 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 is back for follow-up.  He comes in with his wife.  They are both little bit under the weather.  Has had this spot for a issue.  She has had it for about 3 weeks.  She been on some antibiotics for it.  She is slowly getting better.  He has little bit of the same thing.  We did go ahead and do a PET scan on him.  This was done a couple days ago.  Unfortunately, it looks like there is increase in a lingular nodule.  I think this is probably going to be malignant.  I think is going to need to have radiosurgery for this spot.  Where he had the radiosurgery previously, this area looks fine.  He is still smoking.  He smokes a few cigarettes a day.  He has had no hemoptysis.  He has had a little bit of a cough.  Is nonproductive  He has had no change in bowel or bladder habits.  He has had no fever.  He did have a nice Thanksgiving with his family.  They will have a nice Christmas.  Overall, I would say his performance status is ECOG 1.  Next.    Medications:  Allergies as of 06/20/2021       Reactions   Pravastatin Sodium Other (See Comments)   Patient stated,"it messed up my back and legs, I couldn't walk."   Minocycline Nausea Only   Sulfa Antibiotics Nausea Only   Sulfa Drugs Cross Reactors Nausea Only        Medication List        Accurate as of June 20, 2021 11:31 AM. If you have any questions, ask your nurse or doctor.          acetaminophen 500 MG tablet Commonly known as: TYLENOL Take 1,000 mg by mouth every 6 (six) hours as needed for  moderate pain or headache.   albuterol 108 (90 Base) MCG/ACT inhaler Commonly known as: VENTOLIN HFA Inhale 2 puffs into the lungs every 6 (six) hours as needed for wheezing or shortness of breath.   amLODipine 10 MG tablet Commonly known as: NORVASC Take 10 mg by mouth daily.   APPLE CIDER VINEGAR PO Take 15 mLs by mouth daily.   aspirin EC 81 MG tablet Take 81 mg by mouth daily.   atorvastatin 40 MG tablet Commonly known as: LIPITOR 20 mg daily.   clopidogrel 75 MG tablet Commonly known as: Plavix Take 1 tablet (75 mg total) by mouth daily. Okay to restart this medication on 02/20/2020.   fluocinonide 0.05 % external solution Commonly known as: LIDEX Apply 1 application topically every other day.   folic acid 016 MCG tablet Commonly known as: FOLVITE Take 400 mcg by mouth every morning.   glimepiride 1 MG tablet Commonly known as: AMARYL Take 1 mg by mouth every morning.   hydrocortisone 2.5 % cream 1 application daily as needed.   metFORMIN 500 MG 24 hr tablet Commonly known as: GLUCOPHAGE-XR Take 1,000 mg by mouth at bedtime.   metoprolol succinate 25 MG 24 hr tablet Commonly known  as: TOPROL-XL Take 25 mg by mouth daily.   nicotine 21 mg/24hr patch Commonly known as: NICODERM CQ - dosed in mg/24 hours Place onto the skin.   ramipril 10 MG tablet Commonly known as: ALTACE Take 10 mg by mouth daily.   tamsulosin 0.4 MG Caps capsule Commonly known as: FLOMAX Take 0.4 mg by mouth daily as needed (kidney stones).   torsemide 10 MG tablet Commonly known as: DEMADEX   Vitamin D 125 MCG (5000 UT) Caps Take 5,000 Units by mouth daily.        Allergies:  Allergies  Allergen Reactions   Pravastatin Sodium Other (See Comments)    Patient stated,"it messed up my back and legs, I couldn't walk."   Minocycline Nausea Only   Sulfa Antibiotics Nausea Only   Sulfa Drugs Cross Reactors Nausea Only    Past Medical History, Surgical history, Social  history, and Family History were reviewed and updated.  Review of Systems: Review of Systems  Constitutional: Negative.   HENT: Negative.    Eyes: Negative.   Respiratory: Negative.    Cardiovascular: Negative.   Gastrointestinal: Negative.   Genitourinary: Negative.   Musculoskeletal: Negative.   Skin: Negative.   Neurological: Negative.   Endo/Heme/Allergies: Negative.   Psychiatric/Behavioral: Negative.     Physical Exam:  weight is 164 lb (74.4 kg). His oral temperature is 97.8 F (36.6 C). His blood pressure is 146/70 (abnormal) and his pulse is 93. His respiration is 18 and oxygen saturation is 95%.   Wt Readings from Last 3 Encounters:  06/20/21 164 lb (74.4 kg)  03/16/21 166 lb 1.9 oz (75.4 kg)  11/03/20 170 lb 0.6 oz (77.1 kg)    Physical Exam Vitals reviewed.  HENT:     Head: Normocephalic and atraumatic.  Eyes:     Pupils: Pupils are equal, round, and reactive to light.  Cardiovascular:     Rate and Rhythm: Normal rate and regular rhythm.     Heart sounds: Normal heart sounds.  Pulmonary:     Effort: Pulmonary effort is normal.     Breath sounds: Normal breath sounds.  Abdominal:     General: Bowel sounds are normal.     Palpations: Abdomen is soft.  Musculoskeletal:        General: No tenderness or deformity. Normal range of motion.     Cervical back: Normal range of motion.  Lymphadenopathy:     Cervical: No cervical adenopathy.  Skin:    General: Skin is warm and dry.     Findings: No erythema or rash.  Neurological:     Mental Status: He is alert and oriented to person, place, and time.  Psychiatric:        Behavior: Behavior normal.        Thought Content: Thought content normal.        Judgment: Judgment normal.     Lab Results  Component Value Date   WBC 10.4 06/20/2021   HGB 16.1 06/20/2021   HCT 49.0 06/20/2021   MCV 91.6 06/20/2021   PLT 321 06/20/2021   Lab Results  Component Value Date   FERRITIN 13 (L) 03/16/2021   IRON 90  03/16/2021   TIBC 350 03/16/2021   UIBC 260 03/16/2021   IRONPCTSAT 26 03/16/2021   Lab Results  Component Value Date   RETICCTPCT 2.0 03/16/2021   RBC 5.35 06/20/2021   RETICCTABS 77.3 06/20/2015   No results found for: KPAFRELGTCHN, LAMBDASER, KAPLAMBRATIO No results found for: IGGSERUM, IGA,  IGMSERUM No results found for: Kathrynn Ducking, MSPIKE, SPEI   Chemistry      Component Value Date/Time   NA 137 06/20/2021 0924   NA 142 07/18/2017 1421   NA 134 (L) 08/09/2016 1313   K 4.7 06/20/2021 0924   K 4.0 07/18/2017 1421   K 4.2 08/09/2016 1313   CL 102 06/20/2021 0924   CL 103 07/18/2017 1421   CO2 28 06/20/2021 0924   CO2 29 07/18/2017 1421   CO2 20 (L) 08/09/2016 1313   BUN 22 06/20/2021 0924   BUN 19 07/18/2017 1421   BUN 19.9 08/09/2016 1313   CREATININE 1.16 06/20/2021 0924   CREATININE 1.4 (H) 07/18/2017 1421   CREATININE 1.3 08/09/2016 1313      Component Value Date/Time   CALCIUM 10.7 (H) 06/20/2021 0924   CALCIUM 10.2 07/18/2017 1421   CALCIUM 10.0 08/09/2016 1313   ALKPHOS 66 06/20/2021 0924   ALKPHOS 65 07/18/2017 1421   ALKPHOS 75 08/09/2016 1313   AST 9 (L) 06/20/2021 0924   AST 14 08/09/2016 1313   ALT 13 06/20/2021 0924   ALT 21 07/18/2017 1421   ALT 23 08/09/2016 1313   BILITOT 0.4 06/20/2021 0924   BILITOT 0.34 08/09/2016 1313      Impression and Plan: Mr. Caul is a 80 year old male. He has polycythemia. 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 However, phlebotomizing him definitely makes him feel better.  I did a PET scan clearly shows as this lingular nodule is malignant.  I will see if he can have radiosurgery for this.  I will speak with Dr. Sondra Come.  He will get phlebotomized today.  His hematocrit is 49%.  I would like to get him back in about 2 months or so.  I would think that if he can get radiosurgery, this will be done in January.  Volanda Napoleon, MD 12/6/202211:31 AM

## 2021-06-20 NOTE — Progress Notes (Signed)
Robert Tran presents today for phlebotomy per MD orders. Phlebotomy procedure started at 12:00 PM and ended at 12:13 PM 536 grams removed via 18 gauge needle to right AC.  Patient observed for 30 minutes after procedure without any incident. Patient tolerated procedure well and received replacement fluids after procedure.   Patient understands to call if he has any questions or concerns post discharge.

## 2021-06-22 NOTE — Progress Notes (Signed)
Location of tumor and Histology per Pathology Report: lingular nodule seen on PET, no biopsy done  Biopsy:  02/18/2020 A. LUNG, RUL TARGET #1, BRUSHING:  - Benign reactive/reparative changes   B. 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 LUNG, LUL / LINGULA, BRUSHING:  - Benign reactive/reparative changes   D. LUNG, LUL / LINGULA, FINE NEEDLE ASPIRATION:  - Benign reactive/reparative changes   Past/Anticipated interventions by surgeon, if any:   Surgeon: Baltazar Apo Operation: Flexible video fiberoptic bronchoscopy with electromagnetic navigation and biopsies.  Past/Anticipated interventions by medical oncology, if any:  Dr Marin Olp Current Therapy:        Phlebotomy to maintain hematocrit less than 45% Plavix 75 mg by mouth daily Status post stereotactic radiosurgery x5 treatments-completed on 05/06/2020 -Follow-up 09/2021    Pain issues, if any:  Notes occasional sharp shooting pain in his head.  SAFETY ISSUES: Prior radiation? yes, RUL lung 04/26/2020-05/06/2020 Pacemaker/ICD? No Possible current pregnancy? no Is the patient on methotrexate? No  Current Complaints / other details:

## 2021-06-25 NOTE — Progress Notes (Signed)
Radiation Oncology         [phone] ________________________________  Outpatient Re-Consultation  Name: Robert Tran MRN: [id number]  Date: 06/26/2021  DOB: [birth year]  HB:ZJIRC, Gildardo Griffes, MD  Volanda Napoleon, MD   REFERRING PHYSICIAN: Volanda Napoleon, MD  DIAGNOSIS: The encounter diagnosis was Primary cancer of right upper lobe of lung [facility]).  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 male who is accompanied by his wife. he is seen as a courtesy of Dr. Marin Olp for an opinion concerning radiation therapy and re-evaluation as part of management for his recently diagnosed lingular nodule. The patient was last seen here for follow-up on 06/06/2020.   PET on 06/21/2020 demonstrated a response to therapy in the 3 hypermetabolic pulmonary nodules. Marked response of an anterior right upper lobe pulmonary nodule was also appreciated, with a more mild response seen of the posterior right upper and lingular nodules. Otherwise, no evidence of hypermetabolic thoracic nodes, or evidence of hypermetabolic extra thoracic disease were appreciated.   Since his lat visit, the patient followed up with Dr. Marin Olp on 08/18/20. 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   PET on 09/28/20 demonstrated an interval decrease in size of the right upper lobe pulmonary nodules, both of which also demonstrated interval decrease in metabolic activity. The lingular nodule was also seen as stable in size and showing mild residual unchanged hypermetabolism. No evidence of mediastinal or hilar lymphadenopathy, or  metastatic disease involving the abdomen/pelvis or bony structures were appreciated.   The patient again followed up with Dr. Marin Olp on 11/03/20 and was noted to be doing well at that time.   PET on 03/15/21 revealed a slight decrease in size of the right pulmonary nodules in the upper lobe, with similar SUV of the smaller nodule, and decreased SUV of the dominant nodule amidst post treatment changes. However, PET also showed a lingular nodule to display a slight increase in size since December of 2021, with morphologic features concerning for bronchogenic neoplasm.   During follow up with Dr. Marin Olp on 03/16/21, 3 month follow up imaging was recommended in regards to the abnormal lingular nodule seen on recent PET.The patient was otherwise noted to be doing well overall.   PET on 06/16/21 demonstrated stable post treatment changes involving the right upper lobe lung lesions, and no findings suspicious for recurrent tumor. Slight interval enlargement was again seen, with progressive FDG uptake, in the lingular lesion (again noted as suspicious for progressive neoplasm). Progressive right upper lobe interstitial changes were also seen, along with increased FDG uptake; noted to likely reflect radiation changes though continued observation was recommended.  Otherwise, no findings were seen suggestive of mediastinal/hilar metastatic adenopathy, abdominal/pelvic metastatic disease, or osseous metastatic disease.   During his most recent follow up with Dr. Marin Olp on 06/20/21, Dr. Marin Olp noted that the lingular nodule, which has displayed a gradual increase in size, is likely malignant. Subsequently, the Dr. Marin Olp recommended radiosurgery and referred the patient to me here today for consideration.    PAST MEDICAL HISTORY:  Past Medical History:  Diagnosis Date   Chronic  bronchitis (HCC)    Chronic kidney disease    stage 3   Coronary artery disease    Diabetes mellitus without complication  ([place])    type 2   Diverticulosis of intestine with bleeding 07/19/2017   Diverticulosis of large intestine with hemorrhage 07/19/2017   Dyspnea    History of kidney stones    passed stones and lithotripsy   HLD (hyperlipidemia)    Hypertension    Iron deficiency anemia 07/19/2017   Melanoma ([place])    on back    PAST SURGICAL HISTORY: Past Surgical History:  Procedure Laterality Date   BRONCHIAL BIOPSY  02/18/2020   Procedure: BRONCHIAL BIOPSIES;  Surgeon: Collene Gobble, MD;  Location: [facility] ENDOSCOPY;  Service: Pulmonary;;   BRONCHIAL BRUSHINGS  02/18/2020   Procedure: BRONCHIAL BRUSHINGS;  Surgeon: Collene Gobble, MD;  Location[facility] ENDOSCOPY;  Service: Pulmonary;;   BRONCHIAL NEEDLE ASPIRATION BIOPSY  02/18/2020   Procedure: BRONCHIAL NEEDLE ASPIRATION BIOPSIES;  Surgeon: Collene Gobble, MD;  Location: [facility] ENDOSCOPY;  Service: Pulmonary;;   BRONCHIAL WASHINGS  02/18/2020   Procedure: BRONCHIAL WASHINGS;  Surgeon: Collene Gobble, MD;  Location: [facility] ENDOSCOPY;  Service: Pulmonary;;   COLONOSCOPY  2019   EYE SURGERY     bilateral cateracts   LITHOTRIPSY     SKIN CANCER EXCISION     on back   VIDEO BRONCHOSCOPY WITH ENDOBRONCHIAL NAVIGATION N/A 02/18/2020   Procedure: VIDEO BRONCHOSCOPY WITH ENDOBRONCHIAL NAVIGATION;  Surgeon: Collene Gobble, MD;  Location: [facility] ENDOSCOPY;  Service: Pulmonary;  Laterality: N/A;    FAMILY HISTORY: History reviewed. No pertinent family history.  SOCIAL HISTORY:  Social History   Tobacco Use   Smoking status: Every Day    Packs/day: 1.50    Years: 63.00    Pack years: 94.50    Types: Cigarettes    Start date: 07/06/1941   Smokeless tobacco: Never   Tobacco comments:    2-3 cigarettes smoked daily 04/06/20 [facility]   Vaping Use   Vaping Use: Never used  Substance Use Topics   Alcohol use: Yes    Alcohol/week: 2.0 standard drinks    Types: 2 Shots of liquor per week    Comment: 1/5 once a month    Drug use: Never    ALLERGIES:  Allergies  Allergen  Reactions   Pravastatin Sodium Other (See Comments)    Patient stated,"it messed up my back and legs, I couldn't walk."   Minocycline Nausea Only   Sulfa Antibiotics Nausea Only   Sulfa Drugs Cross Reactors Nausea Only    MEDICATIONS:  Current Outpatient Medications  Medication Sig Dispense Refill   acetaminophen (TYLENOL) 500 MG tablet Take 1,000 mg by mouth every 6 (six) hours as needed for moderate pain or headache.     albuterol (VENTOLIN HFA) 108 (90 Base) MCG/ACT inhaler Inhale 2 puffs into the lungs every 6 (six) hours as needed for wheezing or shortness of breath. 18 g 5   amLODipine (NORVASC) 10 MG tablet Take 10 mg by mouth daily.     aspirin EC 81 MG tablet Take 81 mg by mouth daily.     atorvastatin (LIPITOR) 40 MG tablet 20 mg daily.     Cholecalciferol (VITAMIN D) 125 MCG (5000 UT) CAPS Take 5,000 Units by mouth daily.      clopidogrel (PLAVIX) 75 MG tablet Take 1 tablet (75 mg total) by mouth daily. Okay to restart this medication on 02/20/2020.     fluocinonide (LIDEX) 0.05 %  external solution Apply 1 application topically every other day.      folic acid (FOLVITE) 244 MCG tablet Take 400 mcg by mouth every morning.      glimepiride (AMARYL) 1 MG tablet Take 1 mg by mouth every morning.     hydrocortisone 2.5 % cream 1 application daily as needed.     metFORMIN (GLUCOPHAGE-XR) 500 MG 24 hr tablet Take 1,000 mg by mouth at bedtime.      metoprolol succinate (TOPROL-XL) 25 MG 24 hr tablet Take 25 mg by mouth daily.     ramipril (ALTACE) 10 MG tablet Take 10 mg by mouth daily.     tamsulosin (FLOMAX) 0.4 MG CAPS capsule Take 0.4 mg by mouth daily as needed (kidney stones).     APPLE CIDER VINEGAR PO Take 15 mLs by mouth daily. (Patient not taking: Reported on 03/16/2021)     nicotine (NICODERM CQ - DOSED IN MG/24 HOURS) 21 mg/24hr patch Place onto the skin. (Patient not taking: Reported on 08/18/2020)     torsemide (DEMADEX) 10 MG tablet  (Patient not taking: Reported on 08/18/2020)      No current facility-administered medications for this encounter.    REVIEW OF SYSTEMS:  A 10+ POINT REVIEW OF SYSTEMS WAS OBTAINED including neurology, dermatology, psychiatry, cardiac, respiratory, lymph, extremities, GI, GU, musculoskeletal, constitutional, reproductive, HEENT.  Patient reports some dyspnea with exertion.  He denies any significant cough or hemoptysis.  He denies any pain within the chest area.   PHYSICAL EXAM:  height is 5\' 5"  (1.651 m) and weight is 163 lb 2 oz (74 kg). His temporal temperature is 97.3 F (36.3 C) (abnormal). His blood pressure is 134/67 and his pulse is 88. His respiration is 18 and oxygen saturation is 97%.   General: Alert and oriented, in no acute distress HEENT: Head is normocephalic. Extraocular movements are intact.  Neck: Neck is supple, no palpable cervical or supraclavicular lymphadenopathy. Heart: Regular in rate and rhythm with no murmurs, rubs, or gallops. Chest: Clear to auscultation bilaterally, with no rhonchi, wheezes, or rales. Abdomen: Soft, nontender, nondistended, with no rigidity or guarding. Extremities: No cyanosis or edema. Lymphatics: see Neck Exam Skin: No concerning lesions. Musculoskeletal: symmetric strength and muscle tone throughout. Neurologic: Cranial nerves II through XII are grossly intact. No obvious focalities. Speech is fluent. Coordination is intact. Psychiatric: Judgment and insight are intact. Affect is appropriate.   ECOG = 1  0 - Asymptomatic (Fully active, able to carry on all predisease activities without restriction)  1 - Symptomatic but completely ambulatory (Restricted in physically strenuous activity but ambulatory and able to carry out work of a light or sedentary nature. For example, light housework, office work)  2 - Symptomatic, <50% in bed during the day (Ambulatory and capable of all self care but unable to carry out any work activities. Up and about more than 50% of waking hours)  3 -  Symptomatic, >50% in bed, but not bedbound (Capable of only limited self-care, confined to bed or chair 50% or more of waking hours)  4 - Bedbound (Completely disabled. Cannot carry on any self-care. Totally confined to bed or chair)  5 - Death   Eustace Pen MM, Creech RH, Tormey DC, et al. [phone]). "Toxicity and response criteria of the [facility]". [place] Oncol. 5 (6): 649-55  LABORATORY DATA:  Lab Results  Component Value Date   WBC 10.4 06/20/2021   HGB 16.1 06/20/2021   HCT 49.0 06/20/2021  MCV 91.6 06/20/2021   PLT 321 06/20/2021   NEUTROABS 7.2 06/20/2021   Lab Results  Component Value Date   NA 137 06/20/2021   K 4.7 06/20/2021   CL 102 06/20/2021   CO2 28 06/20/2021   GLUCOSE 196 (H) 06/20/2021   CREATININE 1.16 06/20/2021   CALCIUM 10.7 (H) 06/20/2021      RADIOGRAPHY: NM PET Image Restag (PS) Skull Base To Thigh  Result Date: 06/17/2021 CLINICAL DATA:  Subsequent treatment strategy for non-small cell lung cancer. EXAM: NUCLEAR MEDICINE PET SKULL BASE TO THIGH TECHNIQUE: 8.1 mCi F-18 FDG was injected intravenously. Full-ring PET imaging was performed from the skull base to thigh after the radiotracer. CT data was obtained and used for attenuation correction and anatomic localization. Fasting blood glucose: 137 mg/dl COMPARISON:  Multiple prior PET CTs.  The most recent is 03/15/2021 FINDINGS: Mediastinal blood pool activity: SUV max 2.25 Liver activity: SUV max NA NECK: No hypermetabolic lymph nodes in the neck. Incidental CT findings: none CHEST: The right upper lobe pulmonary lesion posteriorly on image 26/6 measures approximately 16.5 x 13 point mm and is unchanged. SUV max is 2.33 which is similar to background mediastinal activity. This was previously 2.70. Findings consistent with expected post treatment changes. No findings for recurrent metabolically active tumor. The smaller right upper lobe nodule anteriorly measures approximately 4.5 mm and  is stable. No discernible FDG uptake is identified. The lingular lesion is slightly larger measuring approximately 2.5 x 1.8 cm and previously measured 2.4 x 1.5 cm. The SUV max is 4.92 this previously 3.08. Findings consistent with progressive neoplasm. Diffuse interstitial process in the right upper lobe is slightly progressive and probably radiation related. SUV max is 2.27 and was previously 1.65. Interstitial tumor not excluded and recommend attention on follow-up studies. No enlarged or hypermetabolic mediastinal or hilar lymph nodes. No new pulmonary nodules.  No acute pulmonary findings. Incidental CT findings: Stable underlying emphysematous changes pulmonary scarring. Stable vascular disease. ABDOMEN/PELVIS: No findings suspicious for abdominal/pelvic metastatic disease. No hepatic or adrenal gland lesions. No enlarged or hypermetabolic lymphadenopathy. Incidental CT findings: Stable advanced vascular disease. SKELETON: No findings suspicious for osseous metastatic disease. Incidental CT findings: none IMPRESSION: 1. Stable post treatment changes involving the right upper lobe lung lesions as detailed above. No findings suspicious for recurrent tumor. 2. Slight interval enlargement and progressive FDG uptake in the lingular lesion suspicious for progressive neoplasm. 3. No findings for mediastinal/hilar metastatic adenopathy. 4. Progressive right upper lobe interstitial changes and slight increased FDG uptake. This is most likely radiation change but recommend continued observation. 5. No findings for abdominal/pelvic metastatic disease or osseous metastatic disease. Electronically Signed   By: Marijo Sanes M.D.   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  This is very suspicious for bronchogenic carcinoma.  We  discussed potential options concerning management of this lesion including potential biopsy or consideration for surgery.  Given the patient's good response with his previous SBRT he does not wish to consider surgical evaluation.  He also does not wish to consider biopsy confirmation of this area.  He feels most comfortable with proceeding with SBRT which he has experienced in tolerated well in the past.  He would be a good candidate for SBRT directed at this lingular nodule.  This is in a  good location for SBRT and could likely be treated in 3 fractions.  I discussed the general course of treatment side effects and potential toxicities of radiation therapy in this situation with the patient and his wife.  He appears to understand and wishes to proceed with planned course of treatment.  PLAN: The patient will return for SBRT simulation on December 15 at 11 AM.  Treatments to begin approximately a week later.  Anticipate 3 SBRT treatments directed at this PET positive lingular nodule.   45 minutes of total time was spent for this patient encounter, including preparation, face-to-face counseling with the patient and coordination of care, physical exam, and documentation of the encounter.   ------------------------------------------------  Blair Promise, PhD, MD  This document serves as a record of services personally performed by Gery Pray, MD. It was created on his behalf by Roney Mans, a trained medical scribe. The creation of this record is based on the scribe's personal observations and the provider's statements to them. This document has been checked and approved by the attending provider.

## 2021-06-26 ENCOUNTER — Ambulatory Visit
Admission: RE | Admit: 2021-06-26 | Discharge: 2021-06-26 | Disposition: A | Discharge status: Home / Self Care | Payer: Medicare Other | Source: Ambulatory Visit | Attending: Radiation Oncology | Admitting: Radiation Oncology

## 2021-06-26 ENCOUNTER — Encounter: Payer: Self-pay | Admitting: Radiation Oncology

## 2021-06-26 ENCOUNTER — Other Ambulatory Visit: Payer: Self-pay

## 2021-06-26 VITALS — BP 134/67 | HR 88 | Temp 97.3°F | Resp 18 | Ht 65.0 in | Wt 163.1 lb

## 2021-06-26 DIAGNOSIS — E119 Type 2 diabetes mellitus without complications: Secondary | ICD-10-CM | POA: Diagnosis not present

## 2021-06-26 DIAGNOSIS — Z87442 Personal history of urinary calculi: Secondary | ICD-10-CM | POA: Diagnosis not present

## 2021-06-26 DIAGNOSIS — I2510 Atherosclerotic heart disease of native coronary artery without angina pectoris: Secondary | ICD-10-CM | POA: Diagnosis not present

## 2021-06-26 DIAGNOSIS — Z7984 Long term (current) use of oral hypoglycemic drugs: Secondary | ICD-10-CM | POA: Diagnosis not present

## 2021-06-26 DIAGNOSIS — I129 Hypertensive chronic kidney disease with stage 1 through stage 4 chronic kidney disease, or unspecified chronic kidney disease: Secondary | ICD-10-CM | POA: Insufficient documentation

## 2021-06-26 DIAGNOSIS — E785 Hyperlipidemia, unspecified: Secondary | ICD-10-CM | POA: Diagnosis not present

## 2021-06-26 DIAGNOSIS — D751 Secondary polycythemia: Secondary | ICD-10-CM | POA: Insufficient documentation

## 2021-06-26 DIAGNOSIS — Z85820 Personal history of malignant melanoma of skin: Secondary | ICD-10-CM | POA: Insufficient documentation

## 2021-06-26 DIAGNOSIS — Z923 Personal history of irradiation: Secondary | ICD-10-CM | POA: Insufficient documentation

## 2021-06-26 DIAGNOSIS — N183 Chronic kidney disease, stage 3 unspecified: Secondary | ICD-10-CM | POA: Diagnosis not present

## 2021-06-26 DIAGNOSIS — Z79899 Other long term (current) drug therapy: Secondary | ICD-10-CM | POA: Insufficient documentation

## 2021-06-26 DIAGNOSIS — D509 Iron deficiency anemia, unspecified: Secondary | ICD-10-CM | POA: Insufficient documentation

## 2021-06-26 DIAGNOSIS — Z7982 Long term (current) use of aspirin: Secondary | ICD-10-CM | POA: Diagnosis not present

## 2021-06-26 DIAGNOSIS — F17210 Nicotine dependence, cigarettes, uncomplicated: Secondary | ICD-10-CM | POA: Diagnosis not present

## 2021-06-26 DIAGNOSIS — C3411 Malignant neoplasm of upper lobe, right bronchus or lung: Secondary | ICD-10-CM | POA: Insufficient documentation

## 2021-06-29 ENCOUNTER — Ambulatory Visit
Admission: RE | Admit: 2021-06-29 | Discharge: 2021-06-29 | Disposition: A | Discharge status: Home / Self Care | Payer: Medicare Other | Source: Ambulatory Visit | Attending: Radiation Oncology | Admitting: Radiation Oncology

## 2021-06-29 ENCOUNTER — Other Ambulatory Visit: Payer: Self-pay

## 2021-06-29 DIAGNOSIS — Z510 Encounter for antineoplastic radiation therapy: Secondary | ICD-10-CM | POA: Insufficient documentation

## 2021-06-29 DIAGNOSIS — C3411 Malignant neoplasm of upper lobe, right bronchus or lung: Secondary | ICD-10-CM

## 2021-06-29 DIAGNOSIS — R911 Solitary pulmonary nodule: Secondary | ICD-10-CM | POA: Diagnosis present

## 2021-07-05 DIAGNOSIS — Z510 Encounter for antineoplastic radiation therapy: Secondary | ICD-10-CM | POA: Diagnosis not present

## 2021-07-12 ENCOUNTER — Ambulatory Visit
Admission: RE | Admit: 2021-07-12 | Discharge: 2021-07-12 | Disposition: A | Discharge status: Home / Self Care | Payer: Medicare Other | Source: Ambulatory Visit | Attending: Radiation Oncology | Admitting: Radiation Oncology

## 2021-07-12 ENCOUNTER — Other Ambulatory Visit: Payer: Self-pay

## 2021-07-12 DIAGNOSIS — C3411 Malignant neoplasm of upper lobe, right bronchus or lung: Secondary | ICD-10-CM

## 2021-07-12 DIAGNOSIS — Z510 Encounter for antineoplastic radiation therapy: Secondary | ICD-10-CM | POA: Diagnosis not present

## 2021-07-13 ENCOUNTER — Ambulatory Visit: Payer: Medicare Other | Admitting: Radiation Oncology

## 2021-07-14 ENCOUNTER — Ambulatory Visit
Admission: RE | Admit: 2021-07-14 | Discharge: 2021-07-14 | Disposition: A | Discharge status: Home / Self Care | Payer: Medicare Other | Source: Ambulatory Visit | Attending: Radiation Oncology | Admitting: Radiation Oncology

## 2021-07-14 ENCOUNTER — Other Ambulatory Visit: Payer: Self-pay

## 2021-07-14 DIAGNOSIS — Z510 Encounter for antineoplastic radiation therapy: Secondary | ICD-10-CM | POA: Diagnosis not present

## 2021-07-18 ENCOUNTER — Ambulatory Visit: Payer: Medicare Other | Admitting: Radiation Oncology

## 2021-07-19 ENCOUNTER — Other Ambulatory Visit: Payer: Self-pay

## 2021-07-19 ENCOUNTER — Encounter: Payer: Self-pay | Admitting: Radiation Oncology

## 2021-07-19 ENCOUNTER — Ambulatory Visit
Admission: RE | Admit: 2021-07-19 | Discharge: 2021-07-19 | Disposition: A | Discharge status: Home / Self Care | Payer: Medicare Other | Source: Ambulatory Visit | Attending: Radiation Oncology | Admitting: Radiation Oncology

## 2021-07-19 DIAGNOSIS — C3411 Malignant neoplasm of upper lobe, right bronchus or lung: Secondary | ICD-10-CM | POA: Diagnosis not present

## 2021-08-17 ENCOUNTER — Encounter: Payer: Self-pay | Admitting: Radiology

## 2021-08-23 NOTE — Progress Notes (Incomplete)
°  Radiation Oncology         (336) [phone] ________________________________  Patient Name: Robert Tran MRN: [id number] DOB: [birth year] Referring Physician: Burney Gauze (Profile Not Attached) Date of Service: 07/19/2021  [facility] [place]                                                        End Of Treatment Note  Diagnoses: C34.11-Malignant neoplasm of upper lobe, right bronchus or lung R91.1-Solitary pulmonary nodule  Cancer Staging: The encounter diagnosis was Primary cancer of right upper lobe of lung ([place]).   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 I recommended he consider seeing his primary care physician to rule out sinusitis.  He otherwise denies any chills or fever or bleeding from his nose.   On physical exam: the lungs are clear except for some mild inspiratory wheezing throughout both lungs. I reccommended for him to use inhaler more frequently.   Plan: The patient will follow-up with radiation oncology in one month.   ________________________________________________ -----------------------------------  Blair Promise, PhD, MD  This document serves as a record of services personally performed by Gery Pray, MD. It was created on his behalf by Roney Mans, a trained medical scribe. The creation of this record is based on the scribe's personal observations and the provider's statements to them. This document has been checked and approved by the attending provider.

## 2021-08-23 NOTE — Progress Notes (Signed)
Radiation Oncology         [phone] ________________________________  Name: Robert Tran MRN: [id number]  Date: 08/24/2021  DOB: Jul 13, 1941  Follow-Up Visit Note  CC: Maris Berger, MD  Volanda Napoleon, MD    ICD-10-CM   1. Primary cancer of right upper lobe of lung (HCC)  C34.11       Diagnosis:  The encounter diagnosis was Primary cancer of right upper lobe of lung ([place]).   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 He tolerated his recent radiation treatments well other than productive cough, shortness of breath, and sinus drainage causing coughing.  He otherwise denied any chills or fever or bleeding from his nose. Physical exam performed on the date of his final treatment revealed the lungs as clear to ausculation except for some mild inspiratory wheezing throughout both lungs. I reccommended for him to use inhaler more frequently.   Otherwise, no significant interval history since the patient was last seen for reevaluation in December.         Today the patient feels well.  He has a mild dry cough but no breathing issues.  He denies any hemoptysis or pain within the chest area.  He did have some pain in his right chest during his left lingular radiation therapy but this is cleared up.  He continues to undergo phlebotomy for polycythemia approximately every 3 months.                       Allergies:  is allergic to pravastatin sodium, minocycline, sulfa antibiotics, and  sulfa drugs cross reactors.  Meds: Current Outpatient Medications  Medication Sig Dispense Refill   albuterol (VENTOLIN HFA) 108 (90 Base) MCG/ACT inhaler Inhale 2 puffs into the lungs every 6 (six) hours as needed for wheezing or shortness of breath. 18 g 5   amLODipine (NORVASC) 10 MG tablet Take 10 mg by mouth daily.     aspirin EC 81 MG tablet Take 81 mg by mouth daily.     Cholecalciferol (VITAMIN D) 125 MCG (5000 UT) CAPS Take 5,000 Units by mouth daily.      clopidogrel (PLAVIX) 75 MG tablet Take 1 tablet (75 mg total) by mouth daily. Okay to restart this medication on 02/20/2020.     fluocinonide (LIDEX) 0.05 % external solution Apply 1 application topically every other day.      folic acid (FOLVITE) 166 MCG tablet Take 400 mcg by mouth every morning.      glimepiride (AMARYL) 1 MG tablet Take 1 mg by mouth every morning.     hydrocortisone 2.5 % cream 1 application daily as needed.     metFORMIN (GLUCOPHAGE-XR) 500 MG 24 hr tablet Take 1,000 mg by mouth at bedtime.      metoprolol succinate (TOPROL-XL) 25 MG 24 hr tablet Take 25 mg by mouth daily.     ramipril (ALTACE) 10 MG tablet Take 10 mg by mouth daily.  tamsulosin (FLOMAX) 0.4 MG CAPS capsule Take 0.4 mg by mouth daily as needed (kidney stones).     acetaminophen (TYLENOL) 500 MG tablet Take 1,000 mg by mouth every 6 (six) hours as needed for moderate pain or headache. (Patient not taking: Reported on 08/24/2021)     APPLE CIDER VINEGAR PO Take 15 mLs by mouth daily. (Patient not taking: Reported on 03/16/2021)     atorvastatin (LIPITOR) 40 MG tablet 20 mg daily. (Patient not taking: Reported on 08/24/2021)     nicotine (NICODERM CQ - DOSED IN MG/24 HOURS) 21 mg/24hr patch Place onto the skin. (Patient not taking: Reported on 08/18/2020)     torsemide (DEMADEX) 10 MG tablet  (Patient not taking: Reported on 08/18/2020)     No current facility-administered medications for this encounter.    Physical Findings: The patient is in no  acute distress. Patient is alert and oriented.  height is 5\' 5"  (1.651 m) and weight is 165 lb 6.4 oz (75 kg). His temperature is 97.8 F (36.6 C). His blood pressure is 116/64 and his pulse is 84. His respiration is 20 and oxygen saturation is 98%. .  No significant changes. Lungs are clear to auscultation bilaterally. Heart has regular rate and rhythm. No palpable cervical, supraclavicular, or axillary adenopathy. Abdomen soft, non-tender, normal bowel sounds.    Lab Findings: Lab Results  Component Value Date   WBC 10.4 06/20/2021   HGB 16.1 06/20/2021   HCT 49.0 06/20/2021   MCV 91.6 06/20/2021   PLT 321 06/20/2021    Radiographic Findings: No results found.  Impression: The encounter diagnosis was Primary cancer of right upper lobe of lung ([place]).   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  Overall he tolerated his most recent SBRT treatments directed at the lingula nodule well.  He denies any lasting effects from this most recent treatment.  Plan: In light of the patient's close follow-up with Dr. Marin Olp have not scheduled him for formal follow-up appointment but would be glad to see him at any time.  The patient will see Dr. Marin Olp in March and will likely have a PET scan prior to this follow-up appointment.    ____________________________________  Blair Promise, PhD, MD  This document serves as a record of services personally performed by Gery Pray, MD. It was created on his behalf by Roney Mans, a trained medical scribe. The creation of this record is based on the scribe's personal observations and the provider's statements to them. This document has been checked and approved by the attending provider.

## 2021-08-24 ENCOUNTER — Encounter: Payer: Self-pay | Admitting: Radiation Oncology

## 2021-08-24 ENCOUNTER — Ambulatory Visit
Admission: RE | Admit: 2021-08-24 | Discharge: 2021-08-24 | Disposition: A | Discharge status: Home / Self Care | Payer: Medicare Other | Source: Ambulatory Visit | Attending: Radiation Oncology | Admitting: Radiation Oncology

## 2021-08-24 ENCOUNTER — Other Ambulatory Visit: Payer: Self-pay

## 2021-08-24 VITALS — BP 116/64 | HR 84 | Temp 97.8°F | Resp 20 | Ht 65.0 in | Wt 165.4 lb

## 2021-08-24 DIAGNOSIS — Z7982 Long term (current) use of aspirin: Secondary | ICD-10-CM | POA: Diagnosis not present

## 2021-08-24 DIAGNOSIS — R059 Cough, unspecified: Secondary | ICD-10-CM | POA: Insufficient documentation

## 2021-08-24 DIAGNOSIS — Z79899 Other long term (current) drug therapy: Secondary | ICD-10-CM | POA: Insufficient documentation

## 2021-08-24 DIAGNOSIS — Z7984 Long term (current) use of oral hypoglycemic drugs: Secondary | ICD-10-CM | POA: Insufficient documentation

## 2021-08-24 DIAGNOSIS — Z923 Personal history of irradiation: Secondary | ICD-10-CM | POA: Diagnosis not present

## 2021-08-24 DIAGNOSIS — C3411 Malignant neoplasm of upper lobe, right bronchus or lung: Secondary | ICD-10-CM | POA: Diagnosis not present

## 2021-08-24 NOTE — Progress Notes (Signed)
Robert Tran is here today for follow up post radiation to the lung.  Lung Side: left Completed radiation treatment on: 07/19/2021  Lung Side: right Completed radiation treatment on: 05/06/2020  Does the patient complain of any of the following: Pain:denies Shortness of breath w/wo exertion: denies Cough: dry cough Hemoptysis: denies Pain with swallowing: denies Swallowing/choking concerns: increased secretions cause choking, chronic problem Appetite: good Energy Level: fair Post radiation skin Changes: denies    Additional comments if applicable: reports pain in right lung during treatment to left lung.  Vitals:   08/24/21 1045  BP: 116/64  Pulse: 84  Resp: 20  Temp: 97.8 F (36.6 C)  SpO2: 98%  Weight: 165 lb 6.4 oz (75 kg)  Height: 5\' 5"  (1.651 m)

## 2021-09-14 ENCOUNTER — Inpatient Hospital Stay: Payer: Medicare Other | Attending: Hematology & Oncology

## 2021-09-14 ENCOUNTER — Encounter: Payer: Self-pay | Admitting: Hematology & Oncology

## 2021-09-14 ENCOUNTER — Inpatient Hospital Stay: Payer: Medicare Other

## 2021-09-14 ENCOUNTER — Other Ambulatory Visit: Payer: Self-pay

## 2021-09-14 ENCOUNTER — Inpatient Hospital Stay (HOSPITAL_BASED_OUTPATIENT_CLINIC_OR_DEPARTMENT_OTHER): Payer: Medicare Other | Admitting: Hematology & Oncology

## 2021-09-14 VITALS — BP 131/66 | HR 77 | Temp 97.6°F | Resp 17 | Wt 165.0 lb

## 2021-09-14 DIAGNOSIS — D45 Polycythemia vera: Secondary | ICD-10-CM | POA: Diagnosis present

## 2021-09-14 DIAGNOSIS — C3411 Malignant neoplasm of upper lobe, right bronchus or lung: Secondary | ICD-10-CM

## 2021-09-14 LAB — CBC WITH DIFFERENTIAL (CANCER CENTER ONLY)
Abs Immature Granulocytes: 0.1 10*3/uL — ABNORMAL HIGH (ref 0.00–0.07)
Basophils Absolute: 0.1 10*3/uL (ref 0.0–0.1)
Basophils Relative: 1 %
Eosinophils Absolute: 0.3 10*3/uL (ref 0.0–0.5)
Eosinophils Relative: 3 %
HCT: 47 % (ref 39.0–52.0)
Hemoglobin: 15.3 g/dL (ref 13.0–17.0)
Immature Granulocytes: 1 %
Lymphocytes Relative: 15 %
Lymphs Abs: 1.2 10*3/uL (ref 0.7–4.0)
MCH: 30 pg (ref 26.0–34.0)
MCHC: 32.6 g/dL (ref 30.0–36.0)
MCV: 92.2 fL (ref 80.0–100.0)
Monocytes Absolute: 0.6 10*3/uL (ref 0.1–1.0)
Monocytes Relative: 7 %
Neutro Abs: 5.9 10*3/uL (ref 1.7–7.7)
Neutrophils Relative %: 73 %
Platelet Count: 278 10*3/uL (ref 150–400)
RBC: 5.1 MIL/uL (ref 4.22–5.81)
RDW: 14.3 % (ref 11.5–15.5)
WBC Count: 8.2 10*3/uL (ref 4.0–10.5)
nRBC: 0 % (ref 0.0–0.2)

## 2021-09-14 LAB — CMP (CANCER CENTER ONLY)
ALT: 11 U/L (ref 0–44)
AST: 9 U/L — ABNORMAL LOW (ref 15–41)
Albumin: 3.7 g/dL (ref 3.5–5.0)
Alkaline Phosphatase: 67 U/L (ref 38–126)
Anion gap: 7 (ref 5–15)
BUN: 29 mg/dL — ABNORMAL HIGH (ref 8–23)
CO2: 25 mmol/L (ref 22–32)
Calcium: 9.8 mg/dL (ref 8.9–10.3)
Chloride: 105 mmol/L (ref 98–111)
Creatinine: 1.23 mg/dL (ref 0.61–1.24)
GFR, Estimated: 59 mL/min — ABNORMAL LOW (ref >60)
Glucose, Bld: 259 mg/dL — ABNORMAL HIGH (ref 70–99)
Potassium: 4.5 mmol/L (ref 3.5–5.1)
Sodium: 137 mmol/L (ref 135–145)
Total Bilirubin: 0.3 mg/dL (ref 0.3–1.2)
Total Protein: 6.4 g/dL — ABNORMAL LOW (ref 6.5–8.1)

## 2021-09-14 LAB — LACTATE DEHYDROGENASE: LDH: 101 U/L (ref 98–192)

## 2021-09-14 LAB — IRON AND IRON BINDING CAPACITY (CC-WL,HP ONLY)
Iron: 52 ug/dL (ref 45–182)
Saturation Ratios: 13 % — ABNORMAL LOW (ref 17.9–39.5)
TIBC: 389 ug/dL (ref 250–450)
UIBC: 337 ug/dL (ref 117–376)

## 2021-09-14 LAB — FERRITIN: Ferritin: 17 ng/mL — ABNORMAL LOW (ref 24–336)

## 2021-09-14 NOTE — Progress Notes (Signed)
?Hematology and Oncology Follow Up Visit ? ?Robert Tran ?[id number] ?07/21/1940 81 y.o. 5 Plavix ?09/14/2021 ? ? 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  ?Interim History:  Robert Tran is back for follow-up.  He is doing quite well.  He had another session of SBRT.  This was for a lingular lesion.  He tolerated this well.  We will repeat a PET scan on him probably in 2 or 3 months to see everything looks. ? ?He is can be retired in 4 months.  He is looking forward to this.  His wife is still working part-time but seems like she is by working more full-time. ? ?He has had no problems with nausea or vomiting.  He has had no headache.  He is still smoking. ? ?There is been no change in bowel or bladder habits.  He has had no rashes.  There is been no leg swelling. ? ?Overall, I would say his performance status is ECOG 1.  .   ? ?Medications:  ?Allergies as of 09/14/2021   ? ?   Reactions  ? Pravastatin Sodium Other (See Comments)  ? Patient stated,"it messed up my back and legs, I couldn't walk."  ? Minocycline Nausea Only  ? Sulfa Antibiotics Nausea Only  ? Sulfa Drugs Cross Reactors Nausea Only  ? ?  ? ?  ?Medication List  ?  ? ?  ? Accurate as of September 14, 2021 10:15 AM. If you have any questions, ask your nurse or doctor.  ?  ?  ? ?  ? ?acetaminophen 500 MG tablet ?Commonly known as: TYLENOL ?Take 1,000 mg by mouth every 6 (six) hours as needed for moderate pain or headache. ?  ?albuterol 108 (90 Base) MCG/ACT inhaler ?Commonly known as: VENTOLIN HFA ?Inhale 2 puffs into the lungs every 6 (six) hours as needed for wheezing or shortness of breath. ?  ?amLODipine 10 MG tablet ?Commonly known as: NORVASC ?Take 10 mg  by mouth daily. ?  ?APPLE CIDER VINEGAR PO ?Take 15 mLs by mouth daily. ?  ?aspirin EC 81 MG tablet ?Take 81 mg by mouth daily. ?  ?atorvastatin 40 MG tablet ?Commonly known as: LIPITOR ?20 mg daily. ?  ?clopidogrel 75 MG tablet ?Commonly known as: Plavix ?Take 1 tablet (75 mg total) by mouth daily. Okay to restart this medication on 02/20/2020. ?  ?fluocinonide 0.05 % external solution ?Commonly known as: LIDEX ?Apply 1 application topically every other day. ?  ?folic acid 793 MCG tablet ?Commonly known as: FOLVITE ?Take 400 mcg by mouth every morning. ?  ?glimepiride 1 MG tablet ?Commonly known as: AMARYL ?Take 1 mg by mouth every morning. ?  ?hydrocortisone 2.5 % cream ?1 application daily as needed. ?  ?metFORMIN 500 MG 24 hr tablet ?Commonly known as: GLUCOPHAGE-XR ?Take 1,000 mg by mouth at bedtime. ?  ?metoprolol succinate 25 MG 24 hr tablet ?Commonly known as: TOPROL-XL ?Take 25 mg by mouth daily. ?  ?nicotine 21 mg/24hr patch ?Commonly known as: NICODERM CQ - dosed in mg/24 hours ?Place onto the skin. ?  ?ramipril 10 MG tablet ?Commonly known as: ALTACE ?Take 10 mg by mouth daily. ?  ?tamsulosin 0.4 MG Caps capsule ?Commonly known as: FLOMAX ?Take 0.4  mg by mouth daily as needed (kidney stones). ?  ?torsemide 10 MG tablet ?Commonly known as: DEMADEX ?  ?Vitamin D 125 MCG (5000 UT) Caps ?Take 5,000 Units by mouth daily. ?  ? ?  ? ? ?Allergies:  ?Allergies  ?Allergen Reactions  ? Pravastatin Sodium Other (See Comments)  ?  Patient stated,"it messed up my back and legs, I couldn't walk."  ? Minocycline Nausea Only  ? Sulfa Antibiotics Nausea Only  ? Sulfa Drugs Cross Reactors Nausea Only  ? ? ?Past Medical History, Surgical history, Social history, and Family History were reviewed and updated. ? ?Review of Systems: ?Review of Systems  ?Constitutional: Negative.   ?HENT: Negative.    ?Eyes: Negative.   ?Respiratory: Negative.    ?Cardiovascular: Negative.   ?Gastrointestinal: Negative.   ?Genitourinary: Negative.    ?Musculoskeletal: Negative.   ?Skin: Negative.   ?Neurological: Negative.   ?Endo/Heme/Allergies: Negative.   ?Psychiatric/Behavioral: Negative.    ? ?Physical Exam: ? weight is 165 lb (74.8 kg). His oral temperature is 97.6 ?F (36.4 ?C). His blood pressure is 131/66 and his pulse is 77. His respiration is 17 and oxygen saturation is 97%.  ? ?Wt Readings from Last 3 Encounters:  ?09/14/21 165 lb (74.8 kg)  ?08/24/21 165 lb 6.4 oz (75 kg)  ?06/26/21 163 lb 2 oz (74 kg)  ? ? ?Physical Exam ?Vitals reviewed.  ?HENT:  ?   Head: Normocephalic and atraumatic.  ?Eyes:  ?   Pupils: Pupils are equal, round, and reactive to light.  ?Cardiovascular:  ?   Rate and Rhythm: Normal rate and regular rhythm.  ?   Heart sounds: Normal heart sounds.  ?Pulmonary:  ?   Effort: Pulmonary effort is normal.  ?   Breath sounds: Normal breath sounds.  ?Abdominal:  ?   General: Bowel sounds are normal.  ?   Palpations: Abdomen is soft.  ?Musculoskeletal:     ?   General: No tenderness or deformity. Normal range of motion.  ?   Cervical back: Normal range of motion.  ?Lymphadenopathy:  ?   Cervical: No cervical adenopathy.  ?Skin: ?   General: Skin is warm and dry.  ?   Findings: No erythema or rash.  ?Neurological:  ?   Mental Status: He is alert and oriented to person, place, and time.  ?Psychiatric:     ?   Behavior: Behavior normal.     ?   Thought Content: Thought content normal.     ?   Judgment: Judgment normal.  ? ? ? ?Lab Results  ?Component Value Date  ? WBC 8.2 09/14/2021  ? HGB 15.3 09/14/2021  ? HCT 47.0 09/14/2021  ? MCV 92.2 09/14/2021  ? PLT 278 09/14/2021  ? ?Lab Results  ?Component Value Date  ? FERRITIN 41 06/20/2021  ? IRON 57 06/20/2021  ? TIBC 300 06/20/2021  ? UIBC 243 06/20/2021  ? IRONPCTSAT 19 (L) 06/20/2021  ? ?Lab Results  ?Component Value Date  ? RETICCTPCT 2.0 03/16/2021  ? RBC 5.10 09/14/2021  ? RETICCTABS 77.3 06/20/2015  ? ?No results found for: KPAFRELGTCHN, LAMBDASER, KAPLAMBRATIO ?No results found for:  IGGSERUM, IGA, IGMSERUM ?No results found for: TOTALPROTELP, ALBUMINELP, A1GS, A2GS, BETS, BETA2SER, GAMS, MSPIKE, SPEI ?  Chemistry   ?   ?Component Value Date/Time  ? NA 137 09/14/2021 0905  ? NA 142 07/18/2017 1421  ? NA 134 (L) 08/09/2016 1313  ? K 4.5 09/14/2021 0905  ? K 4.0 07/18/2017 1421  ?  K 4.2 08/09/2016 1313  ? CL 105 09/14/2021 0905  ? CL 103 07/18/2017 1421  ? CO2 25 09/14/2021 0905  ? CO2 29 07/18/2017 1421  ? CO2 20 (L) 08/09/2016 1313  ? BUN 29 (H) 09/14/2021 0905  ? BUN 19 07/18/2017 1421  ? BUN 19.9 08/09/2016 1313  ? CREATININE 1.23 09/14/2021 0905  ? CREATININE 1.4 (H) 07/18/2017 1421  ? CREATININE 1.3 08/09/2016 1313  ?    ?Component Value Date/Time  ? CALCIUM 9.8 09/14/2021 0905  ? CALCIUM 10.2 07/18/2017 1421  ? CALCIUM 10.0 08/09/2016 1313  ? ALKPHOS 67 09/14/2021 0905  ? ALKPHOS 65 07/18/2017 1421  ? ALKPHOS 75 08/09/2016 1313  ? AST 9 (L) 09/14/2021 0905  ? AST 14 08/09/2016 1313  ? ALT 11 09/14/2021 0905  ? ALT 21 07/18/2017 1421  ? ALT 23 08/09/2016 1313  ? BILITOT 0.3 09/14/2021 0905  ? BILITOT 0.34 08/09/2016 1313  ?  ? ? ?Impression and Plan: Robert Tran is a 81 year old male. He has polycythemia. 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 However, phlebotomizing him definitely makes him feel better. ? ?Again, he had another session of SBRT to a lingular nodule.  We will repeat a PET scan on him to see how this looks. ? ?We will hold off on an phlebotomized him.  He feels well.  I just wish she would stop smoking. ? ?I will plan to get him back in a couple months.  We will get the PET scan right before we see him back. ? ? ? ?Volanda Napoleon, MD ?3/2/202310:15 AM ? ?

## 2021-09-24 IMAGING — PT NM PET TUM IMG RESTAG (PS) SKULL BASE T - THIGH
1 of 7 series · 1 of 25 positions shown · non-contrast
Comparison: September 28, 2020 and June 22, 2020.

CLINICAL DATA: Subsequent treatment strategy for lung cancer.

EXAM:
NUCLEAR MEDICINE PET SKULL BASE TO THIGH
TECHNIQUE: 8.4 mCi F-18 FDG was injected intravenously. Full-ring PET imaging
was performed from the skull base to thigh after the radiotracer. CT
data was obtained and used for attenuation correction and anatomic
localization.
Fasting blood glucose: 162 mg/dl

[Series 4: ct sk_thigh 5.0 bf37 · axial · 5.0mm · 0.98mm/px · 1 of 233 slices shown]
[im 233/233  brain]
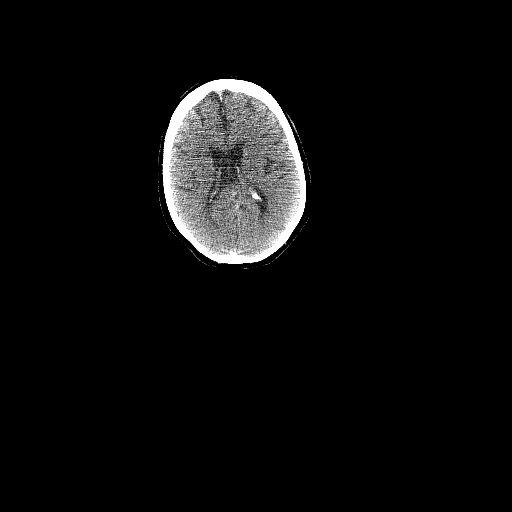

[1 of 25 positions shown; findings below may reference images not displayed]

FINDINGS: Mediastinal blood pool activity: SUV max

Liver activity: SUV max

NECK: No hypermetabolic lymph nodes in the neck.

Incidental CT findings: none

CHEST: No hypermetabolic lymph nodes in the chest.

Signs of pulmonary emphysema. Post treatment changes about the RIGHT
chest when compared to the previous study.

RIGHT upper lobe nodule (image 71/4) difficult to measure due to
surrounding post treatment changes, approximately 5 mm, previously
approximately 6 mm showing a maximum SUV on today's study of 1.4 as
compared to

RIGHT lower lobe nodule (image 70/4) maximum SUV of 2.7, previously
2.9 this measures 1.5 x 1.3 cm as compared to 1.7 x 1.6 cm.

(Image 87/4) lingular nodule stable grossly accounting for motion on
the previous study at 1.6 cm as measured by this observer also on
the prior exam with a maximum SUV of 3.1 previously 2.8. Size does
appear larger when compared to the study Monday June, 2020 where
it measured approximately 12 mm greatest size. No new pulmonary
nodule.

Incidental CT findings: Calcified atheromatous plaque in the
thoracic aorta. Three-vessel coronary artery disease. No aneurysmal
dilation. Normal caliber of the central pulmonary vasculature. Signs
of pulmonary emphysema as before. Airways are patent.

ABDOMEN/PELVIS: No abnormal hypermetabolic activity within the
liver, pancreas, adrenal glands, or spleen. No hypermetabolic lymph
nodes in the abdomen or pelvis.

Incidental CT findings: Sludge in the dependent gallbladder. No
pericholecystic stranding. Normal noncontrast appearance of
pancreas, spleen, and adrenal glands. Bilateral renal cortical
scarring which is worse on the RIGHT. Nephrolithiasis in the lower
pole the RIGHT kidney is similar to the prior study 6 mm calculus in
the lower pole. Bilateral renal cysts. No perivesical stranding.
Colonic diverticulosis without signs of inflammation with sigmoid
diverticular disease. The appendix is normal.

Small fat containing umbilical hernia. Incipient herniation of the
RIGHT anterolateral urinary bladder into a moderate fat containing
RIGHT inguinal hernia.

SKELETON: No focal hypermetabolic activity to suggest skeletal
metastasis.

Incidental CT findings: Spinal degenerative changes. No acute or
destructive bone process.
IMPRESSION: Slight decrease in size of RIGHT pulmonary nodules in the upper lobe
with similar SUV of the smaller nodule and decreased SUV of the
dominant nodule in amidst post treatment changes.

Lingular nodule with slight increase in size over time than
(certainly larger in Tuesday January, 2020 and Monday June, 2020) though there
is some motion which accentuates the size of this nodule on the
current study, potentially enlarged from 12-16 mm with slight
increase in SUV albeit minimal since the prior study at 3.1 as
compared to 2.8. (SUV values are however similar to imaging from
Tuesday January, 2020 and only minimally increased in the interval)
morphologic features are concerning for bronchogenic neoplasm.
Correlate with any treatment in this area in the past.

Nephrolithiasis with renal cortical scarring. Sludge in the
gallbladder. Cholelithiasis and atherosclerosis and other incidental
findings as above.

Aortic Atherosclerosis (2X60H-0KX.X) and Emphysema (2X60H-EIF.F).

## 2021-11-23 ENCOUNTER — Ambulatory Visit (HOSPITAL_COMMUNITY)
Admission: RE | Admit: 2021-11-23 | Discharge: 2021-11-23 | Disposition: A | Discharge status: Home / Self Care | Payer: Medicare Other | Source: Ambulatory Visit | Attending: Hematology & Oncology | Admitting: Hematology & Oncology

## 2021-11-23 DIAGNOSIS — C3411 Malignant neoplasm of upper lobe, right bronchus or lung: Secondary | ICD-10-CM | POA: Insufficient documentation

## 2021-11-23 LAB — GLUCOSE, CAPILLARY: Glucose-Capillary: 150 mg/dL — ABNORMAL HIGH (ref 70–99)

## 2021-11-23 MED ORDER — FLUDEOXYGLUCOSE F - 18 (FDG) INJECTION
8.2600 | Freq: Once | INTRAVENOUS | Status: AC
  Administered 2021-11-23: 8.26 via INTRAVENOUS

## 2021-11-27 ENCOUNTER — Encounter: Payer: Self-pay | Admitting: *Deleted

## 2021-11-28 ENCOUNTER — Inpatient Hospital Stay: Payer: Medicare Other | Attending: Hematology & Oncology

## 2021-11-28 ENCOUNTER — Inpatient Hospital Stay: Payer: Medicare Other

## 2021-11-28 ENCOUNTER — Inpatient Hospital Stay (HOSPITAL_BASED_OUTPATIENT_CLINIC_OR_DEPARTMENT_OTHER): Payer: Medicare Other | Admitting: Hematology & Oncology

## 2021-11-28 ENCOUNTER — Encounter: Payer: Self-pay | Admitting: Hematology & Oncology

## 2021-11-28 VITALS — BP 136/67 | HR 83 | Temp 97.6°F | Resp 20 | Wt 160.8 lb

## 2021-11-28 VITALS — BP 120/71 | HR 82

## 2021-11-28 DIAGNOSIS — C3411 Malignant neoplasm of upper lobe, right bronchus or lung: Secondary | ICD-10-CM

## 2021-11-28 DIAGNOSIS — Z85118 Personal history of other malignant neoplasm of bronchus and lung: Secondary | ICD-10-CM | POA: Diagnosis not present

## 2021-11-28 DIAGNOSIS — Z08 Encounter for follow-up examination after completed treatment for malignant neoplasm: Secondary | ICD-10-CM | POA: Diagnosis not present

## 2021-11-28 DIAGNOSIS — D45 Polycythemia vera: Secondary | ICD-10-CM

## 2021-11-28 DIAGNOSIS — F17210 Nicotine dependence, cigarettes, uncomplicated: Secondary | ICD-10-CM | POA: Diagnosis not present

## 2021-11-28 LAB — IRON AND IRON BINDING CAPACITY (CC-WL,HP ONLY)
Iron: 67 ug/dL (ref 45–182)
Saturation Ratios: 18 % (ref 17.9–39.5)
TIBC: 378 ug/dL (ref 250–450)
UIBC: 311 ug/dL (ref 117–376)

## 2021-11-28 LAB — CMP (CANCER CENTER ONLY)
ALT: 8 U/L (ref 0–44)
AST: 8 U/L — ABNORMAL LOW (ref 15–41)
Albumin: 4.1 g/dL (ref 3.5–5.0)
Alkaline Phosphatase: 60 U/L (ref 38–126)
Anion gap: 6 (ref 5–15)
BUN: 21 mg/dL (ref 8–23)
CO2: 24 mmol/L (ref 22–32)
Calcium: 10.5 mg/dL — ABNORMAL HIGH (ref 8.9–10.3)
Chloride: 108 mmol/L (ref 98–111)
Creatinine: 1.3 mg/dL — ABNORMAL HIGH (ref 0.61–1.24)
GFR, Estimated: 56 mL/min — ABNORMAL LOW (ref >60)
Glucose, Bld: 192 mg/dL — ABNORMAL HIGH (ref 70–99)
Potassium: 4.4 mmol/L (ref 3.5–5.1)
Sodium: 138 mmol/L (ref 135–145)
Total Bilirubin: 0.4 mg/dL (ref 0.3–1.2)
Total Protein: 6.7 g/dL (ref 6.5–8.1)

## 2021-11-28 LAB — CBC WITH DIFFERENTIAL (CANCER CENTER ONLY)
Abs Immature Granulocytes: 0.07 10*3/uL (ref 0.00–0.07)
Basophils Absolute: 0.1 10*3/uL (ref 0.0–0.1)
Basophils Relative: 1 %
Eosinophils Absolute: 0.3 10*3/uL (ref 0.0–0.5)
Eosinophils Relative: 4 %
HCT: 48.7 % (ref 39.0–52.0)
Hemoglobin: 15.9 g/dL (ref 13.0–17.0)
Immature Granulocytes: 1 %
Lymphocytes Relative: 19 %
Lymphs Abs: 1.6 10*3/uL (ref 0.7–4.0)
MCH: 30.3 pg (ref 26.0–34.0)
MCHC: 32.6 g/dL (ref 30.0–36.0)
MCV: 92.8 fL (ref 80.0–100.0)
Monocytes Absolute: 0.5 10*3/uL (ref 0.1–1.0)
Monocytes Relative: 6 %
Neutro Abs: 5.5 10*3/uL (ref 1.7–7.7)
Neutrophils Relative %: 69 %
Platelet Count: 260 10*3/uL (ref 150–400)
RBC: 5.25 MIL/uL (ref 4.22–5.81)
RDW: 14.2 % (ref 11.5–15.5)
WBC Count: 8 10*3/uL (ref 4.0–10.5)
nRBC: 0 % (ref 0.0–0.2)

## 2021-11-28 LAB — RETICULOCYTES
Immature Retic Fract: 19 % — ABNORMAL HIGH (ref 2.3–15.9)
RBC.: 5.2 MIL/uL (ref 4.22–5.81)
Retic Count, Absolute: 114.9 10*3/uL (ref 19.0–186.0)
Retic Ct Pct: 2.2 % (ref 0.4–3.1)

## 2021-11-28 LAB — LACTATE DEHYDROGENASE: LDH: 96 U/L — ABNORMAL LOW (ref 98–192)

## 2021-11-28 LAB — FERRITIN: Ferritin: 17 ng/mL — ABNORMAL LOW (ref 24–336)

## 2021-11-28 MED ORDER — SODIUM CHLORIDE 0.9 % IV SOLN: INTRAVENOUS | Status: AC

## 2021-11-28 NOTE — Progress Notes (Signed)
Robert Tran presents today for phlebotomy per MD orders. ?Phlebotomy procedure started at 1120 and ended at 1130. ?560 cc removed via 16 G needle at [facility] site. ?Patient tolerated procedure well. ?Patient refused to wait 30 minutes post phlebotomy, released stable and ASX. ?

## 2021-11-28 NOTE — Progress Notes (Signed)
?Hematology and Oncology Follow Up Visit ? ?Robert Tran ?[id number] ?May 21, 1941 81 y.o. 5 Plavix ?11/28/2021 ? ? 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  ?Interim History:  Robert Tran is back for follow-up.  He comes in with his wife.  He is doing okay.  He turns 23 years old about a month.  We did do a PET scan on him.  This was done on 11/24/2021.  This did show the area of the lingula that is improved.  Now measures 1 x 1.8 cm.  It has an SUV of 1.44.  There is an area in the right upper lobe which measures 1.7 x 1.5 cm.  This has SUV of 1.89.  There is a new nodular density adjacent to the right upper lobe hilum.  This has an SUV of 3 with a size of 1.5 x 0.8 cm. ? ?He still smokes.  I am not sure when she smokes a day. ? ?We will have to phlebotomize him today.  His hematocrit is 48.7. ? ?He has had no headache.  He has had no nausea or vomiting.  He has had no increased cough or shortness of breath.  He has had no change in bowel or bladder habits. ? ?Overall, I would say his performance status is probably ECOG 1.  ? ? ?Medications:  ?Allergies as of 11/28/2021   ? ?   Reactions  ? Pravastatin Sodium Other (See Comments)  ? Patient stated,"it messed up my back and legs, I couldn't walk."  ? Minocycline Nausea Only  ? Sulfa Antibiotics Nausea Only  ? Sulfa Drugs Cross Reactors Nausea Only  ? ?  ? ?  ?Medication List  ?  ? ?  ? Accurate as of Nov 28, 2021 11:23 AM. If you have any questions, ask your nurse or doctor.  ?  ?  ? ?  ? ?STOP taking these medications   ? ?APPLE CIDER VINEGAR PO ?Stopped by: Volanda Napoleon, MD ?  ?atorvastatin 40 MG tablet ?Commonly known as: LIPITOR ?Stopped by: Volanda Napoleon,  MD ?  ?glimepiride 1 MG tablet ?Commonly known as: AMARYL ?Stopped by: Volanda Napoleon, MD ?  ? ?  ? ?TAKE these medications   ? ?acetaminophen 500 MG tablet ?Commonly known as: TYLENOL ?Take 1,000 mg by mouth every 6 (six) hours as needed for moderate pain or headache. ?  ?albuterol 108 (90 Base) MCG/ACT inhaler ?Commonly known as: VENTOLIN HFA ?Inhale 2 puffs into the lungs every 6 (six) hours as needed for wheezing or shortness of breath. ?  ?amLODipine 10 MG tablet ?Commonly known as: NORVASC ?Take 10 mg by mouth daily. ?  ?aspirin EC 81 MG tablet ?Take 81 mg by mouth daily. ?  ?clopidogrel 75 MG tablet ?Commonly known as: Plavix ?Take 1 tablet (75 mg total) by mouth daily. Okay to restart this medication on 02/20/2020. ?  ?empagliflozin 10 MG Tabs tablet ?Commonly known as: JARDIANCE ?Take 1 tablet by mouth daily. ?  ?fluocinonide 0.05 % external solution ?Commonly known as: LIDEX ?Apply 1 application topically every other day. ?  ?folic acid 056 MCG tablet ?Commonly known as: FOLVITE ?Take 400 mcg by mouth every morning. ?  ?hydrocortisone 2.5 % cream ?1 application  daily as needed. ?  ?metFORMIN 500 MG 24 hr tablet ?Commonly known as: GLUCOPHAGE-XR ?Take 1,000 mg by mouth at bedtime. ?  ?metoprolol succinate 25 MG 24 hr tablet ?Commonly known as: TOPROL-XL ?Take 25 mg by mouth daily. ?  ?nicotine 21 mg/24hr patch ?Commonly known as: NICODERM CQ - dosed in mg/24 hours ?Place onto the skin. ?  ?ramipril 10 MG tablet ?Commonly known as: ALTACE ?Take 10 mg by mouth daily. ?  ?tamsulosin 0.4 MG Caps capsule ?Commonly known as: FLOMAX ?Take 0.4 mg by mouth daily as needed (kidney stones). ?  ?torsemide 10 MG tablet ?Commonly known as: DEMADEX ?  ?Vitamin D 125 MCG (5000 UT) Caps ?Take 5,000 Units by mouth daily. ?  ? ?  ? ? ?Allergies:  ?Allergies  ?Allergen Reactions  ? Pravastatin Sodium Other (See Comments)  ?  Patient stated,"it messed up my back and legs, I couldn't walk."  ? Minocycline Nausea Only  ? Sulfa  Antibiotics Nausea Only  ? Sulfa Drugs Cross Reactors Nausea Only  ? ? ?Past Medical History, Surgical history, Social history, and Family History were reviewed and updated. ? ?Review of Systems: ?Review of Systems  ?Constitutional: Negative.   ?HENT: Negative.    ?Eyes: Negative.   ?Respiratory: Negative.    ?Cardiovascular: Negative.   ?Gastrointestinal: Negative.   ?Genitourinary: Negative.   ?Musculoskeletal: Negative.   ?Skin: Negative.   ?Neurological: Negative.   ?Endo/Heme/Allergies: Negative.   ?Psychiatric/Behavioral: Negative.    ? ?Physical Exam: ? weight is 160 lb 12.8 oz (72.9 kg). His oral temperature is 97.6 ?F (36.4 ?C). His blood pressure is 136/67 and his pulse is 83. His respiration is 20 and oxygen saturation is 96%.  ? ?Wt Readings from Last 3 Encounters:  ?11/28/21 160 lb 12.8 oz (72.9 kg)  ?09/14/21 165 lb (74.8 kg)  ?08/24/21 165 lb 6.4 oz (75 kg)  ? ? ?Physical Exam ?Vitals reviewed.  ?HENT:  ?   Head: Normocephalic and atraumatic.  ?Eyes:  ?   Pupils: Pupils are equal, round, and reactive to light.  ?Cardiovascular:  ?   Rate and Rhythm: Normal rate and regular rhythm.  ?   Heart sounds: Normal heart sounds.  ?Pulmonary:  ?   Effort: Pulmonary effort is normal.  ?   Breath sounds: Normal breath sounds.  ?Abdominal:  ?   General: Bowel sounds are normal.  ?   Palpations: Abdomen is soft.  ?Musculoskeletal:     ?   General: No tenderness or deformity. Normal range of motion.  ?   Cervical back: Normal range of motion.  ?Lymphadenopathy:  ?   Cervical: No cervical adenopathy.  ?Skin: ?   General: Skin is warm and dry.  ?   Findings: No erythema or rash.  ?Neurological:  ?   Mental Status: He is alert and oriented to person, place, and time.  ?Psychiatric:     ?   Behavior: Behavior normal.     ?   Thought Content: Thought content normal.     ?   Judgment: Judgment normal.  ? ? ? ?Lab Results  ?Component Value Date  ? WBC 8.0 11/28/2021  ? HGB 15.9 11/28/2021  ? HCT 48.7 11/28/2021  ? MCV 92.8  11/28/2021  ? PLT 260 11/28/2021  ? ?Lab Results  ?Component Value Date  ? FERRITIN 17 (L) 09/14/2021  ? IRON 52 09/14/2021  ? TIBC 389 09/14/2021  ? UIBC 337 09/14/2021  ? IRONPCTSAT 13 (L) 09/14/2021  ? ?Lab  Results  ?Component Value Date  ? RETICCTPCT 2.2 11/28/2021  ? RBC 5.20 11/28/2021  ? RETICCTABS 77.3 06/20/2015  ? ?No results found for: KPAFRELGTCHN, LAMBDASER, KAPLAMBRATIO ?No results found for: IGGSERUM, IGA, IGMSERUM ?No results found for: TOTALPROTELP, ALBUMINELP, A1GS, A2GS, BETS, BETA2SER, GAMS, MSPIKE, SPEI ?  Chemistry   ?   ?Component Value Date/Time  ? NA 138 11/28/2021 1000  ? NA 142 07/18/2017 1421  ? NA 134 (L) 08/09/2016 1313  ? K 4.4 11/28/2021 1000  ? K 4.0 07/18/2017 1421  ? K 4.2 08/09/2016 1313  ? CL 108 11/28/2021 1000  ? CL 103 07/18/2017 1421  ? CO2 24 11/28/2021 1000  ? CO2 29 07/18/2017 1421  ? CO2 20 (L) 08/09/2016 1313  ? BUN 21 11/28/2021 1000  ? BUN 19 07/18/2017 1421  ? BUN 19.9 08/09/2016 1313  ? CREATININE 1.30 (H) 11/28/2021 1000  ? CREATININE 1.4 (H) 07/18/2017 1421  ? CREATININE 1.3 08/09/2016 1313  ?    ?Component Value Date/Time  ? CALCIUM 10.5 (H) 11/28/2021 1000  ? CALCIUM 10.2 07/18/2017 1421  ? CALCIUM 10.0 08/09/2016 1313  ? ALKPHOS 60 11/28/2021 1000  ? ALKPHOS 65 07/18/2017 1421  ? ALKPHOS 75 08/09/2016 1313  ? AST 8 (L) 11/28/2021 1000  ? AST 14 08/09/2016 1313  ? ALT 8 11/28/2021 1000  ? ALT 21 07/18/2017 1421  ? ALT 23 08/09/2016 1313  ? BILITOT 0.4 11/28/2021 1000  ? BILITOT 0.34 08/09/2016 1313  ?  ? ? ?Impression and Plan: Mr. Amison is a 81 year old male. He has polycythemia. 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 However, phlebotomizing him definitely makes him feel better. ? ?I am not sure what to make of this new nodule in the right upper lobe adjacent to the hilum.  We will get have to repeat a PET scan on him probably about 3 to 4 months. ? ?Again we will phlebotomize him today.  I want his hematocrit down a little bit more. ? ?Again he still smokes.  I  am sure this is part of the issue with his polycythemia but also with respect to him having lung nodules. ? ?I will plan to see him back in a couple months just to follow-up, mostly with his blood. ? ?

## 2021-11-28 NOTE — Patient Instructions (Signed)

## 2022-01-08 DIAGNOSIS — Z789 Other specified health status: Secondary | ICD-10-CM | POA: Insufficient documentation

## 2022-01-30 ENCOUNTER — Inpatient Hospital Stay: Payer: Medicare Other | Attending: Hematology & Oncology

## 2022-01-30 ENCOUNTER — Encounter: Payer: Self-pay | Admitting: Hematology & Oncology

## 2022-01-30 ENCOUNTER — Other Ambulatory Visit: Payer: Self-pay

## 2022-01-30 ENCOUNTER — Inpatient Hospital Stay (HOSPITAL_BASED_OUTPATIENT_CLINIC_OR_DEPARTMENT_OTHER): Payer: Medicare Other | Admitting: Hematology & Oncology

## 2022-01-30 ENCOUNTER — Inpatient Hospital Stay: Payer: Medicare Other

## 2022-01-30 VITALS — BP 129/70 | HR 83 | Temp 97.5°F | Resp 19 | Wt 157.0 lb

## 2022-01-30 DIAGNOSIS — C3411 Malignant neoplasm of upper lobe, right bronchus or lung: Secondary | ICD-10-CM

## 2022-01-30 DIAGNOSIS — D45 Polycythemia vera: Secondary | ICD-10-CM

## 2022-01-30 DIAGNOSIS — E785 Hyperlipidemia, unspecified: Secondary | ICD-10-CM | POA: Insufficient documentation

## 2022-01-30 LAB — CMP (CANCER CENTER ONLY)
ALT: 9 U/L (ref 0–44)
AST: 8 U/L — ABNORMAL LOW (ref 15–41)
Albumin: 4.3 g/dL (ref 3.5–5.0)
Alkaline Phosphatase: 66 U/L (ref 38–126)
Anion gap: 8 (ref 5–15)
BUN: 31 mg/dL — ABNORMAL HIGH (ref 8–23)
CO2: 21 mmol/L — ABNORMAL LOW (ref 22–32)
Calcium: 10.2 mg/dL (ref 8.9–10.3)
Chloride: 107 mmol/L (ref 98–111)
Creatinine: 1.38 mg/dL — ABNORMAL HIGH (ref 0.61–1.24)
GFR, Estimated: 51 mL/min — ABNORMAL LOW (ref >60)
Glucose, Bld: 212 mg/dL — ABNORMAL HIGH (ref 70–99)
Potassium: 4.3 mmol/L (ref 3.5–5.1)
Sodium: 136 mmol/L (ref 135–145)
Total Bilirubin: 0.4 mg/dL (ref 0.3–1.2)
Total Protein: 6.7 g/dL (ref 6.5–8.1)

## 2022-01-30 LAB — CBC WITH DIFFERENTIAL (CANCER CENTER ONLY)
Abs Immature Granulocytes: 0.07 10*3/uL (ref 0.00–0.07)
Basophils Absolute: 0.1 10*3/uL (ref 0.0–0.1)
Basophils Relative: 1 %
Eosinophils Absolute: 0.3 10*3/uL (ref 0.0–0.5)
Eosinophils Relative: 3 %
HCT: 49.4 % (ref 39.0–52.0)
Hemoglobin: 16.1 g/dL (ref 13.0–17.0)
Immature Granulocytes: 1 %
Lymphocytes Relative: 19 %
Lymphs Abs: 1.6 10*3/uL (ref 0.7–4.0)
MCH: 30 pg (ref 26.0–34.0)
MCHC: 32.6 g/dL (ref 30.0–36.0)
MCV: 92.2 fL (ref 80.0–100.0)
Monocytes Absolute: 0.6 10*3/uL (ref 0.1–1.0)
Monocytes Relative: 7 %
Neutro Abs: 6.1 10*3/uL (ref 1.7–7.7)
Neutrophils Relative %: 69 %
Platelet Count: 275 10*3/uL (ref 150–400)
RBC: 5.36 MIL/uL (ref 4.22–5.81)
RDW: 14.1 % (ref 11.5–15.5)
WBC Count: 8.7 10*3/uL (ref 4.0–10.5)
nRBC: 0 % (ref 0.0–0.2)

## 2022-01-30 LAB — FERRITIN: Ferritin: 14 ng/mL — ABNORMAL LOW (ref 24–336)

## 2022-01-30 NOTE — Patient Instructions (Signed)

## 2022-01-30 NOTE — Progress Notes (Signed)
[name] presents today for phlebotomy per MD orders. Phlebotomy procedure started at 1125 and ended at 1139. 518 grams removed from rt [facility] using 16g phlebotomy kit by Kennieth Rad, RN Patient observed for 30 minutes after procedure without any incident. Patient tolerated procedure well. IV needle removed intact.

## 2022-01-30 NOTE — Progress Notes (Signed)
Hematology and Oncology Follow Up Visit  Robert Tran [id number] [birth year] 81 y.o. 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  He comes in with his wife.  He and his wife have been doing well.  They have a nice RV.  They are down to [facility].  They had a good time down there.  He is doing well.  He really has had no complaints.  He has had no problems with nausea or vomiting.  There is no problems with cough.  He has had no chest wall pain.  He is still smoking.  He has had no leg swelling.  He has had no rashes.  He now is on Repatha for his hyperlipidemia.  There is been no fever.  Overall, I was his performance status is probably ECOG 1.     Medications:  Allergies as of 01/30/2022       Reactions   Pravastatin Sodium Other (See Comments)   Patient stated,"it messed up my back and legs, I couldn't walk."   Minocycline Nausea Only   Sulfa Antibiotics Nausea Only   Sulfa Drugs Cross Reactors Nausea Only        Medication List        Accurate as of January 30, 2022 10:58 AM. If you have any questions, ask your nurse or doctor.          STOP taking these medications    acetaminophen 500 MG tablet Commonly known as: TYLENOL Stopped by: Volanda Napoleon, MD       TAKE these medications    albuterol 108 (90 Base) MCG/ACT inhaler Commonly known as: VENTOLIN HFA Inhale 2 puffs into the lungs every 6 (six) hours as needed for wheezing or shortness of breath.   amLODipine 10 MG tablet Commonly known as: NORVASC Take 10 mg by mouth daily.   aspirin EC 81 MG tablet Take 81 mg by mouth  daily.   clopidogrel 75 MG tablet Commonly known as: Plavix Take 1 tablet (75 mg total) by mouth daily. Okay to restart this medication on 02/20/2020.   empagliflozin 10 MG Tabs tablet Commonly known as: JARDIANCE Take 1 tablet by mouth daily.   fluocinonide 0.05 % external solution Commonly known as: LIDEX Apply 1 application topically every other day.   folic acid 353 MCG tablet Commonly known as: FOLVITE Take 400 mcg by mouth every morning.   hydrocortisone 2.5 % cream 1 application daily as needed.   metFORMIN 500 MG 24 hr tablet Commonly known as: GLUCOPHAGE-XR Take 1,000 mg by mouth at bedtime.   metoprolol succinate 25 MG 24 hr tablet Commonly known as: TOPROL-XL Take 25 mg by mouth daily.   nicotine 21 mg/24hr patch Commonly known as: NICODERM CQ - dosed in mg/24 hours Place onto the skin.   ramipril 10 MG tablet Commonly known as: ALTACE Take 10 mg by mouth daily.   Repatha SureClick 614 MG/ML Soaj Generic drug: Evolocumab Inject 1 Syringe into the skin every 14 (fourteen) days.   tamsulosin 0.4 MG Caps capsule Commonly known as: FLOMAX Take 0.4 mg by mouth daily as needed (kidney stones).   torsemide 10 MG  tablet Commonly known as: DEMADEX   Vitamin D 125 MCG (5000 UT) Caps Take 5,000 Units by mouth daily.        Allergies:  Allergies  Allergen Reactions   Pravastatin Sodium Other (See Comments)    Patient stated,"it messed up my back and legs, I couldn't walk."   Minocycline Nausea Only   Sulfa Antibiotics Nausea Only   Sulfa Drugs Cross Reactors Nausea Only    Past Medical History, Surgical history, Social history, and Family History were reviewed and updated.  Review of Systems: Review of Systems  Constitutional: Negative.   HENT: Negative.    Eyes: Negative.   Respiratory: Negative.    Cardiovascular: Negative.   Gastrointestinal: Negative.   Genitourinary: Negative.   Musculoskeletal: Negative.   Skin: Negative.    Neurological: Negative.   Endo/Heme/Allergies: Negative.   Psychiatric/Behavioral: Negative.      Physical Exam:  weight is 157 lb (71.2 kg). His oral temperature is 97.5 F (36.4 C) (abnormal). His blood pressure is 129/70 and his pulse is 83. His respiration is 19 and oxygen saturation is 96%.   Wt Readings from Last 3 Encounters:  01/30/22 157 lb (71.2 kg)  11/28/21 160 lb 12.8 oz (72.9 kg)  09/14/21 165 lb (74.8 kg)    Physical Exam Vitals reviewed.  HENT:     Head: Normocephalic and atraumatic.  Eyes:     Pupils: Pupils are equal, round, and reactive to light.  Cardiovascular:     Rate and Rhythm: Normal rate and regular rhythm.     Heart sounds: Normal heart sounds.  Pulmonary:     Effort: Pulmonary effort is normal.     Breath sounds: Normal breath sounds.  Abdominal:     General: Bowel sounds are normal.     Palpations: Abdomen is soft.  Musculoskeletal:        General: No tenderness or deformity. Normal range of motion.     Cervical back: Normal range of motion.  Lymphadenopathy:     Cervical: No cervical adenopathy.  Skin:    General: Skin is warm and dry.     Findings: No erythema or rash.  Neurological:     Mental Status: He is alert and oriented to person, place, and time.  Psychiatric:        Behavior: Behavior normal.        Thought Content: Thought content normal.        Judgment: Judgment normal.      Lab Results  Component Value Date   WBC 8.7 01/30/2022   HGB 16.1 01/30/2022   HCT 49.4 01/30/2022   MCV 92.2 01/30/2022   PLT 275 01/30/2022   Lab Results  Component Value Date   FERRITIN 17 (L) 11/28/2021   IRON 67 11/28/2021   TIBC 378 11/28/2021   UIBC 311 11/28/2021   IRONPCTSAT 18 11/28/2021   Lab Results  Component Value Date   RETICCTPCT 2.2 11/28/2021   RBC 5.36 01/30/2022   RETICCTABS 77.3 06/20/2015   No results found for: "KPAFRELGTCHN", "LAMBDASER", "KAPLAMBRATIO" No results found for: "IGGSERUM", "IGA",  "IGMSERUM" No results found for: "TOTALPROTELP", "ALBUMINELP", "A1GS", "A2GS", "BETS", "BETA2SER", "GAMS", "MSPIKE", "SPEI"   Chemistry      Component Value Date/Time   NA 136 01/30/2022 1004   NA 142 07/18/2017 1421   NA 134 (L) 08/09/2016 1313   K 4.3 01/30/2022 1004   K 4.0 07/18/2017 1421   K 4.2 08/09/2016 1313   CL 107 01/30/2022 1004  CL 103 07/18/2017 1421   CO2 21 (L) 01/30/2022 1004   CO2 29 07/18/2017 1421   CO2 20 (L) 08/09/2016 1313   BUN 31 (H) 01/30/2022 1004   BUN 19 07/18/2017 1421   BUN 19.9 08/09/2016 1313   CREATININE 1.38 (H) 01/30/2022 1004   CREATININE 1.4 (H) 07/18/2017 1421   CREATININE 1.3 08/09/2016 1313      Component Value Date/Time   CALCIUM 10.2 01/30/2022 1004   CALCIUM 10.2 07/18/2017 1421   CALCIUM 10.0 08/09/2016 1313   ALKPHOS 66 01/30/2022 1004   ALKPHOS 65 07/18/2017 1421   ALKPHOS 75 08/09/2016 1313   AST 8 (L) 01/30/2022 1004   AST 14 08/09/2016 1313   ALT 9 01/30/2022 1004   ALT 21 07/18/2017 1421   ALT 23 08/09/2016 1313   BILITOT 0.4 01/30/2022 1004   BILITOT 0.34 08/09/2016 1313      Impression and Plan: Robert Tran is a 81 year old male. He has polycythemia. 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 However, phlebotomizing him definitely makes him feel better.  We will go ahead and phlebotomize him today.  Of note, his iron studies not been a problem.  More last saw him back in May, his ferritin was 17 with an iron saturation of 18%.  We do have to get a another PET scan on him.  We will have to watch the right upper lobe.  I just wish he would stop smoking.  I know that I talked him about this.  He just is determined to keep smoking because he enjoys it.  We will plan to get him back in another 2 months or so.   Volanda Napoleon, MD 7/18/202310:58 AM

## 2022-01-31 LAB — IRON AND IRON BINDING CAPACITY (CC-WL,HP ONLY)
Iron: 63 ug/dL (ref 45–182)
Saturation Ratios: 17 % — ABNORMAL LOW (ref 17.9–39.5)
TIBC: 374 ug/dL (ref 250–450)
UIBC: 311 ug/dL (ref 117–376)

## 2022-04-19 ENCOUNTER — Encounter (HOSPITAL_COMMUNITY)
Admission: RE | Admit: 2022-04-19 | Discharge: 2022-04-19 | Disposition: A | Discharge status: Home / Self Care | Payer: Medicare Other | Source: Ambulatory Visit | Attending: Hematology & Oncology | Admitting: Hematology & Oncology

## 2022-04-19 DIAGNOSIS — C3411 Malignant neoplasm of upper lobe, right bronchus or lung: Secondary | ICD-10-CM | POA: Diagnosis present

## 2022-04-19 LAB — GLUCOSE, CAPILLARY: Glucose-Capillary: 138 mg/dL — ABNORMAL HIGH (ref 70–99)

## 2022-04-19 MED ORDER — FLUDEOXYGLUCOSE F - 18 (FDG) INJECTION
7.8000 | Freq: Once | INTRAVENOUS | Status: AC
  Administered 2022-04-19: 7.88 via INTRAVENOUS

## 2022-04-25 ENCOUNTER — Encounter: Payer: Self-pay | Admitting: Hematology & Oncology

## 2022-04-25 ENCOUNTER — Inpatient Hospital Stay: Payer: Medicare Other | Attending: Hematology & Oncology

## 2022-04-25 ENCOUNTER — Inpatient Hospital Stay: Payer: Medicare Other

## 2022-04-25 ENCOUNTER — Inpatient Hospital Stay (HOSPITAL_BASED_OUTPATIENT_CLINIC_OR_DEPARTMENT_OTHER): Payer: Medicare Other | Admitting: Hematology & Oncology

## 2022-04-25 VITALS — BP 121/75 | HR 76 | Temp 97.6°F | Resp 18 | Wt 156.4 lb

## 2022-04-25 DIAGNOSIS — D45 Polycythemia vera: Secondary | ICD-10-CM | POA: Diagnosis present

## 2022-04-25 DIAGNOSIS — C3411 Malignant neoplasm of upper lobe, right bronchus or lung: Secondary | ICD-10-CM

## 2022-04-25 LAB — CMP (CANCER CENTER ONLY)
ALT: 8 U/L (ref 0–44)
AST: 9 U/L — ABNORMAL LOW (ref 15–41)
Albumin: 4.1 g/dL (ref 3.5–5.0)
Alkaline Phosphatase: 58 U/L (ref 38–126)
Anion gap: 7 (ref 5–15)
BUN: 26 mg/dL — ABNORMAL HIGH (ref 8–23)
CO2: 24 mmol/L (ref 22–32)
Calcium: 10.3 mg/dL (ref 8.9–10.3)
Chloride: 107 mmol/L (ref 98–111)
Creatinine: 1.59 mg/dL — ABNORMAL HIGH (ref 0.61–1.24)
GFR, Estimated: 43 mL/min — ABNORMAL LOW (ref >60)
Glucose, Bld: 209 mg/dL — ABNORMAL HIGH (ref 70–99)
Potassium: 4.9 mmol/L (ref 3.5–5.1)
Sodium: 138 mmol/L (ref 135–145)
Total Bilirubin: 0.4 mg/dL (ref 0.3–1.2)
Total Protein: 6.6 g/dL (ref 6.5–8.1)

## 2022-04-25 LAB — IRON AND IRON BINDING CAPACITY (CC-WL,HP ONLY)
Iron: 63 ug/dL (ref 45–182)
Saturation Ratios: 17 % — ABNORMAL LOW (ref 17.9–39.5)
TIBC: 381 ug/dL (ref 250–450)
UIBC: 318 ug/dL (ref 117–376)

## 2022-04-25 LAB — RETICULOCYTES
Immature Retic Fract: 19.1 % — ABNORMAL HIGH (ref 2.3–15.9)
RBC.: 5.33 MIL/uL (ref 4.22–5.81)
Retic Count, Absolute: 107.7 10*3/uL (ref 19.0–186.0)
Retic Ct Pct: 2 % (ref 0.4–3.1)

## 2022-04-25 LAB — CBC WITH DIFFERENTIAL (CANCER CENTER ONLY)
Abs Immature Granulocytes: 0.08 10*3/uL — ABNORMAL HIGH (ref 0.00–0.07)
Basophils Absolute: 0.1 10*3/uL (ref 0.0–0.1)
Basophils Relative: 1 %
Eosinophils Absolute: 0.3 10*3/uL (ref 0.0–0.5)
Eosinophils Relative: 4 %
HCT: 49.9 % (ref 39.0–52.0)
Hemoglobin: 16 g/dL (ref 13.0–17.0)
Immature Granulocytes: 1 %
Lymphocytes Relative: 19 %
Lymphs Abs: 1.6 10*3/uL (ref 0.7–4.0)
MCH: 29.7 pg (ref 26.0–34.0)
MCHC: 32.1 g/dL (ref 30.0–36.0)
MCV: 92.8 fL (ref 80.0–100.0)
Monocytes Absolute: 0.4 10*3/uL (ref 0.1–1.0)
Monocytes Relative: 5 %
Neutro Abs: 5.7 10*3/uL (ref 1.7–7.7)
Neutrophils Relative %: 70 %
Platelet Count: 296 10*3/uL (ref 150–400)
RBC: 5.38 MIL/uL (ref 4.22–5.81)
RDW: 14.2 % (ref 11.5–15.5)
WBC Count: 8.2 10*3/uL (ref 4.0–10.5)
nRBC: 0 % (ref 0.0–0.2)

## 2022-04-25 LAB — FERRITIN: Ferritin: 12 ng/mL — ABNORMAL LOW (ref 24–336)

## 2022-04-25 NOTE — Patient Instructions (Signed)

## 2022-04-25 NOTE — Progress Notes (Signed)
Hematology and Oncology Follow Up Visit  Robert Tran [id number] 10/19/1940 81 y.o. 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  He comes in with his wife.  As always, he is doing well.  He is cut back on his caffeine intake.  He cut back on his tobacco use.  He and his wife now have 2 new great-grandchildren.  I think they are up to 9 or 10 great-grandchildren.  We did do a PET scan on him.  This was done on 04/19/2022.  The PET scan did not show any evidence of residual disease where he had radiosurgery.  I am extremely happy about this.  He has had no problem with headaches.  There is no increased cough or shortness of breath.  His blood sugars are still on the high side.  He has had no change in bowel or bladder habits.  He has had no leg swelling.  Currently, his iron saturation is 17%.  Overall, I was his performance status is ECOG 1.   Medications:  Allergies as of 04/25/2022       Reactions   Pravastatin Sodium Other (See Comments)   Patient stated,"it messed up my back and legs, I couldn't walk."   Minocycline Nausea Only   Sulfa Antibiotics Nausea Only   Sulfa Drugs Cross Reactors Nausea Only        Medication List        Accurate as of April 25, 2022 12:16 PM. If you have any questions, ask your nurse or doctor.          albuterol 108 (90 Base) MCG/ACT inhaler Commonly known as: VENTOLIN HFA Inhale 2 puffs into the lungs every 6 (six) hours as needed for wheezing or shortness of breath.   amLODipine 10 MG tablet Commonly known as: NORVASC Take 10 mg by mouth daily.   aspirin  EC 81 MG tablet Take 81 mg by mouth daily.   clopidogrel 75 MG tablet Commonly known as: Plavix Take 1 tablet (75 mg total) by mouth daily. Okay to restart this medication on 02/20/2020.   empagliflozin 10 MG Tabs tablet Commonly known as: JARDIANCE Take 1 tablet by mouth daily.   fluocinonide 0.05 % external solution Commonly known as: LIDEX Apply 1 application topically every other day.   folic acid 102 MCG tablet Commonly known as: FOLVITE Take 400 mcg by mouth every morning.   hydrocortisone 2.5 % cream 1 application daily as needed.   metFORMIN 500 MG 24 hr tablet Commonly known as: GLUCOPHAGE-XR Take 1,000 mg by mouth at bedtime.   metoprolol succinate 25 MG 24 hr tablet Commonly known as: TOPROL-XL Take 25 mg by mouth daily.   nicotine 21 mg/24hr patch Commonly known as: NICODERM CQ - dosed in mg/24 hours Place onto the skin.   ramipril 10 MG tablet Commonly known as: ALTACE Take 10 mg by mouth daily.   Repatha SureClick 585 MG/ML Soaj Generic drug: Evolocumab Inject 1 Syringe into the skin every 14 (fourteen) days.   tamsulosin 0.4 MG Caps capsule Commonly known as: FLOMAX Take 0.4 mg by mouth daily as needed (kidney stones).   torsemide 10 MG  tablet Commonly known as: DEMADEX   Vitamin D 125 MCG (5000 UT) Caps Take 5,000 Units by mouth daily.        Allergies:  Allergies  Allergen Reactions   Pravastatin Sodium Other (See Comments)    Patient stated,"it messed up my back and legs, I couldn't walk."   Minocycline Nausea Only   Sulfa Antibiotics Nausea Only   Sulfa Drugs Cross Reactors Nausea Only    Past Medical History, Surgical history, Social history, and Family History were reviewed and updated.  Review of Systems: Review of Systems  Constitutional: Negative.   HENT: Negative.    Eyes: Negative.   Respiratory: Negative.    Cardiovascular: Negative.   Gastrointestinal: Negative.   Genitourinary: Negative.   Musculoskeletal:  Negative.   Skin: Negative.   Neurological: Negative.   Endo/Heme/Allergies: Negative.   Psychiatric/Behavioral: Negative.      Physical Exam:  weight is 156 lb 6.4 oz (70.9 kg). His oral temperature is 97.6 F (36.4 C). His blood pressure is 121/75 and his pulse is 76. His respiration is 18 and oxygen saturation is 97%.   Wt Readings from Last 3 Encounters:  04/25/22 156 lb 6.4 oz (70.9 kg)  01/30/22 157 lb (71.2 kg)  11/28/21 160 lb 12.8 oz (72.9 kg)    Physical Exam Vitals reviewed.  HENT:     Head: Normocephalic and atraumatic.  Eyes:     Pupils: Pupils are equal, round, and reactive to light.  Cardiovascular:     Rate and Rhythm: Normal rate and regular rhythm.     Heart sounds: Normal heart sounds.  Pulmonary:     Effort: Pulmonary effort is normal.     Breath sounds: Normal breath sounds.  Abdominal:     General: Bowel sounds are normal.     Palpations: Abdomen is soft.  Musculoskeletal:        General: No tenderness or deformity. Normal range of motion.     Cervical back: Normal range of motion.  Lymphadenopathy:     Cervical: No cervical adenopathy.  Skin:    General: Skin is warm and dry.     Findings: No erythema or rash.  Neurological:     Mental Status: He is alert and oriented to person, place, and time.  Psychiatric:        Behavior: Behavior normal.        Thought Content: Thought content normal.        Judgment: Judgment normal.     Lab Results  Component Value Date   WBC 8.2 04/25/2022   HGB 16.0 04/25/2022   HCT 49.9 04/25/2022   MCV 92.8 04/25/2022   PLT 296 04/25/2022   Lab Results  Component Value Date   FERRITIN 14 (L) 01/30/2022   IRON 63 01/30/2022   TIBC 374 01/30/2022   UIBC 311 01/30/2022   IRONPCTSAT 17 (L) 01/30/2022   Lab Results  Component Value Date   RETICCTPCT 2.0 04/25/2022   RBC 5.33 04/25/2022   RETICCTABS 77.3 06/20/2015   No results found for: "KPAFRELGTCHN", "LAMBDASER", "KAPLAMBRATIO" No results found  for: "IGGSERUM", "IGA", "IGMSERUM" No results found for: "TOTALPROTELP", "ALBUMINELP", "A1GS", "A2GS", "BETS", "BETA2SER", "GAMS", "MSPIKE", "SPEI"   Chemistry      Component Value Date/Time   NA 138 04/25/2022 0952   NA 142 07/18/2017 1421   NA 134 (L) 08/09/2016 1313   K 4.9 04/25/2022 0952   K 4.0 07/18/2017 1421   K 4.2 08/09/2016 1313   CL 107 04/25/2022  0952   CL 103 07/18/2017 1421   CO2 24 04/25/2022 0952   CO2 29 07/18/2017 1421   CO2 20 (L) 08/09/2016 1313   BUN 26 (H) 04/25/2022 0952   BUN 19 07/18/2017 1421   BUN 19.9 08/09/2016 1313   CREATININE 1.59 (H) 04/25/2022 0952   CREATININE 1.4 (H) 07/18/2017 1421   CREATININE 1.3 08/09/2016 1313      Component Value Date/Time   CALCIUM 10.3 04/25/2022 0952   CALCIUM 10.2 07/18/2017 1421   CALCIUM 10.0 08/09/2016 1313   ALKPHOS 58 04/25/2022 0952   ALKPHOS 65 07/18/2017 1421   ALKPHOS 75 08/09/2016 1313   AST 9 (L) 04/25/2022 0952   AST 14 08/09/2016 1313   ALT 8 04/25/2022 0952   ALT 21 07/18/2017 1421   ALT 23 08/09/2016 1313   BILITOT 0.4 04/25/2022 0952   BILITOT 0.34 08/09/2016 1313      Impression and Plan: Mr. Vidrio is a 81 year old male. He has polycythemia. 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 However, phlebotomizing him definitely makes him feel better.  We will go ahead and phlebotomize him today.  Of note, his iron studies have never been a problem.  I am just happy that he has a good quality of life.  I am happy that the radiosurgery helped with his lung cancer.  I am not happy that he is still smoking.  He is trying to stop.  I do not think we have to do another PET scan probably until the Spring.  We will get him back after the Holiday season.  I know that he and his wife will be incredibly busy with their large family.    Volanda Napoleon, MD 10/11/202312:16 PM

## 2022-04-25 NOTE — Progress Notes (Signed)
[name] presents today for phlebotomy per MD orders. Phlebotomy procedure started at 1143 and ended at 1148.  305 grams removed via 16 gauge needle to left AC using phlebotomy kit as IV infiltrated. Small hematoma noted to insertion site; pressure dressing applied. Dr. Marin Olp aware of only getting 305 grams removed; at this time ok to hold from finishing procedure. Pt and wife agreeable to plan.  Patient observed for 30 minutes after procedure without any incident. Patient tolerated procedure well. IV needle removed intact.

## 2022-05-04 DIAGNOSIS — R079 Chest pain, unspecified: Secondary | ICD-10-CM | POA: Insufficient documentation

## 2022-07-26 ENCOUNTER — Encounter: Payer: Self-pay | Admitting: Hematology & Oncology

## 2022-07-26 ENCOUNTER — Inpatient Hospital Stay: Payer: Medicare Other

## 2022-07-26 ENCOUNTER — Inpatient Hospital Stay (HOSPITAL_BASED_OUTPATIENT_CLINIC_OR_DEPARTMENT_OTHER): Payer: Medicare Other | Admitting: Hematology & Oncology

## 2022-07-26 ENCOUNTER — Inpatient Hospital Stay: Payer: Medicare Other | Attending: Hematology & Oncology

## 2022-07-26 VITALS — BP 129/61 | HR 86 | Temp 97.7°F | Resp 18 | Wt 155.0 lb

## 2022-07-26 DIAGNOSIS — D45 Polycythemia vera: Secondary | ICD-10-CM | POA: Diagnosis not present

## 2022-07-26 DIAGNOSIS — C3411 Malignant neoplasm of upper lobe, right bronchus or lung: Secondary | ICD-10-CM | POA: Diagnosis not present

## 2022-07-26 LAB — CMP (CANCER CENTER ONLY)
ALT: 9 U/L (ref 0–44)
AST: 8 U/L — ABNORMAL LOW (ref 15–41)
Albumin: 4.1 g/dL (ref 3.5–5.0)
Alkaline Phosphatase: 58 U/L (ref 38–126)
Anion gap: 7 (ref 5–15)
BUN: 30 mg/dL — ABNORMAL HIGH (ref 8–23)
CO2: 24 mmol/L (ref 22–32)
Calcium: 10.2 mg/dL (ref 8.9–10.3)
Chloride: 106 mmol/L (ref 98–111)
Creatinine: 1.38 mg/dL — ABNORMAL HIGH (ref 0.61–1.24)
GFR, Estimated: 51 mL/min — ABNORMAL LOW (ref >60)
Glucose, Bld: 163 mg/dL — ABNORMAL HIGH (ref 70–99)
Potassium: 4.6 mmol/L (ref 3.5–5.1)
Sodium: 137 mmol/L (ref 135–145)
Total Bilirubin: 0.4 mg/dL (ref 0.3–1.2)
Total Protein: 7 g/dL (ref 6.5–8.1)

## 2022-07-26 LAB — CBC WITH DIFFERENTIAL (CANCER CENTER ONLY)
Abs Immature Granulocytes: 0.06 10*3/uL (ref 0.00–0.07)
Basophils Absolute: 0.1 10*3/uL (ref 0.0–0.1)
Basophils Relative: 1 %
Eosinophils Absolute: 0.3 10*3/uL (ref 0.0–0.5)
Eosinophils Relative: 4 %
HCT: 52 % (ref 39.0–52.0)
Hemoglobin: 16.7 g/dL (ref 13.0–17.0)
Immature Granulocytes: 1 %
Lymphocytes Relative: 19 %
Lymphs Abs: 1.5 10*3/uL (ref 0.7–4.0)
MCH: 29.9 pg (ref 26.0–34.0)
MCHC: 32.1 g/dL (ref 30.0–36.0)
MCV: 93.2 fL (ref 80.0–100.0)
Monocytes Absolute: 0.6 10*3/uL (ref 0.1–1.0)
Monocytes Relative: 7 %
Neutro Abs: 5.2 10*3/uL (ref 1.7–7.7)
Neutrophils Relative %: 68 %
Platelet Count: 261 10*3/uL (ref 150–400)
RBC: 5.58 MIL/uL (ref 4.22–5.81)
RDW: 14.9 % (ref 11.5–15.5)
WBC Count: 7.7 10*3/uL (ref 4.0–10.5)
nRBC: 0 % (ref 0.0–0.2)

## 2022-07-26 LAB — FERRITIN: Ferritin: 15 ng/mL — ABNORMAL LOW (ref 24–336)

## 2022-07-26 LAB — IRON AND IRON BINDING CAPACITY (CC-WL,HP ONLY)
Iron: 55 ug/dL (ref 45–182)
Saturation Ratios: 15 % — ABNORMAL LOW (ref 17.9–39.5)
TIBC: 364 ug/dL (ref 250–450)
UIBC: 309 ug/dL (ref 117–376)

## 2022-07-26 NOTE — Progress Notes (Signed)
Hematology and Oncology Follow Up Visit  Robert Tran [id number] 1940-08-16 82 y.o. 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  We last saw him back in October.  At that time his hematocrit was 50%.  He gets phlebotomized.  He does have the early stage synchronous lung cancers.  His last PET scan was back in October.  We will have to do another PET scan probably in March.  Unfortunately, a lot of the family was sick over the [place].  This was probably combination of COVID and may be Influenza.  Currently, his wife is not feeling all that well.  He is still smoking little bit.  He is trying to cut back on his coffee intake.  His last iron studies back in October showed a ferritin of 12 with an iron saturation of 17%.  He has had no bleeding.  He has had no obvious change in bowel or bladder habits.  He has had no leg swelling.  There is been no issues with neuropathy.  Overall, I would say his performance status is ECOG 1.  Medications:  Allergies as of 07/26/2022       Reactions   Pravastatin Sodium Other (See Comments)   Patient stated,"it messed up my back and legs, I couldn't walk."   Minocycline Nausea Only   Sulfa Antibiotics Nausea Only   Sulfa Drugs Cross Reactors Nausea Only        Medication List        Accurate as of July 26, 2022 11:00 AM. If you have any questions, ask your nurse or doctor.          albuterol 108 (90 Base) MCG/ACT inhaler Commonly known as: VENTOLIN HFA Inhale 2 puffs into the lungs every 6 (six) hours as needed for wheezing or shortness of breath.    amLODipine 10 MG tablet Commonly known as: NORVASC Take 10 mg by mouth daily.   aspirin EC 81 MG tablet Take 81 mg by mouth daily.   clopidogrel 75 MG tablet Commonly known as: Plavix Take 1 tablet (75 mg total) by mouth daily. Okay to restart this medication on 02/20/2020.   empagliflozin 10 MG Tabs tablet Commonly known as: JARDIANCE Take 1 tablet by mouth daily.   fluocinonide 0.05 % external solution Commonly known as: LIDEX Apply 1 application topically every other day.   folic acid 250 MCG tablet Commonly known as: FOLVITE Take 400 mcg by mouth every morning.   hydrocortisone 2.5 % cream 1 application daily as needed.   metFORMIN 500 MG 24 hr tablet Commonly known as: GLUCOPHAGE-XR Take 1,000 mg by mouth at bedtime.   metoprolol succinate 25 MG 24 hr tablet Commonly known as: TOPROL-XL Take 25 mg by mouth daily.   nicotine 21 mg/24hr patch Commonly known as: NICODERM CQ - dosed in mg/24 hours Place onto the skin.   ramipril 10 MG tablet Commonly known as: ALTACE Take 10 mg by mouth daily.   Repatha SureClick 539 MG/ML Soaj Generic drug: Evolocumab Inject 1 Syringe into the skin every 14 (fourteen) days.   tamsulosin 0.4 MG Caps capsule Commonly known as: FLOMAX Take 0.4 mg by  mouth daily as needed (kidney stones).   torsemide 10 MG tablet Commonly known as: DEMADEX   Vitamin D 125 MCG (5000 UT) Caps Take 5,000 Units by mouth daily.        Allergies:  Allergies  Allergen Reactions   Pravastatin Sodium Other (See Comments)    Patient stated,"it messed up my back and legs, I couldn't walk."   Minocycline Nausea Only   Sulfa Antibiotics Nausea Only   Sulfa Drugs Cross Reactors Nausea Only    Past Medical History, Surgical history, Social history, and Family History were reviewed and updated.  Review of Systems: Review of Systems  Constitutional: Negative.   HENT: Negative.    Eyes: Negative.   Respiratory: Negative.    Cardiovascular:  Negative.   Gastrointestinal: Negative.   Genitourinary: Negative.   Musculoskeletal: Negative.   Skin: Negative.   Neurological: Negative.   Endo/Heme/Allergies: Negative.   Psychiatric/Behavioral: Negative.      Physical Exam:  weight is 155 lb (70.3 kg). His oral temperature is 97.7 F (36.5 C). His blood pressure is 129/61 and his pulse is 86. His respiration is 18 and oxygen saturation is 98%.   Wt Readings from Last 3 Encounters:  07/26/22 155 lb (70.3 kg)  04/25/22 156 lb 6.4 oz (70.9 kg)  01/30/22 157 lb (71.2 kg)    Physical Exam Vitals reviewed.  HENT:     Head: Normocephalic and atraumatic.  Eyes:     Pupils: Pupils are equal, round, and reactive to light.  Cardiovascular:     Rate and Rhythm: Normal rate and regular rhythm.     Heart sounds: Normal heart sounds.  Pulmonary:     Effort: Pulmonary effort is normal.     Breath sounds: Normal breath sounds.  Abdominal:     General: Bowel sounds are normal.     Palpations: Abdomen is soft.  Musculoskeletal:        General: No tenderness or deformity. Normal range of motion.     Cervical back: Normal range of motion.  Lymphadenopathy:     Cervical: No cervical adenopathy.  Skin:    General: Skin is warm and dry.     Findings: No erythema or rash.  Neurological:     Mental Status: He is alert and oriented to person, place, and time.  Psychiatric:        Behavior: Behavior normal.        Thought Content: Thought content normal.        Judgment: Judgment normal.     Lab Results  Component Value Date   WBC 7.7 07/26/2022   HGB 16.7 07/26/2022   HCT 52.0 07/26/2022   MCV 93.2 07/26/2022   PLT 261 07/26/2022   Lab Results  Component Value Date   FERRITIN 12 (L) 04/25/2022   IRON 63 04/25/2022   TIBC 381 04/25/2022   UIBC 318 04/25/2022   IRONPCTSAT 17 (L) 04/25/2022   Lab Results  Component Value Date   RETICCTPCT 2.0 04/25/2022   RBC 5.58 07/26/2022   RETICCTABS 77.3 06/20/2015   No results  found for: "KPAFRELGTCHN", "LAMBDASER", "KAPLAMBRATIO" No results found for: "IGGSERUM", "IGA", "IGMSERUM" No results found for: "TOTALPROTELP", "ALBUMINELP", "A1GS", "A2GS", "BETS", "BETA2SER", "GAMS", "MSPIKE", "SPEI"   Chemistry      Component Value Date/Time   NA 138 04/25/2022 0952   NA 142 07/18/2017 1421   NA 134 (L) 08/09/2016 1313   K 4.9 04/25/2022 0952   K 4.0 07/18/2017 1421   K 4.2  08/09/2016 1313   CL 107 04/25/2022 0952   CL 103 07/18/2017 1421   CO2 24 04/25/2022 0952   CO2 29 07/18/2017 1421   CO2 20 (L) 08/09/2016 1313   BUN 26 (H) 04/25/2022 0952   BUN 19 07/18/2017 1421   BUN 19.9 08/09/2016 1313   CREATININE 1.59 (H) 04/25/2022 0952   CREATININE 1.4 (H) 07/18/2017 1421   CREATININE 1.3 08/09/2016 1313      Component Value Date/Time   CALCIUM 10.3 04/25/2022 0952   CALCIUM 10.2 07/18/2017 1421   CALCIUM 10.0 08/09/2016 1313   ALKPHOS 58 04/25/2022 0952   ALKPHOS 65 07/18/2017 1421   ALKPHOS 75 08/09/2016 1313   AST 9 (L) 04/25/2022 0952   AST 14 08/09/2016 1313   ALT 8 04/25/2022 0952   ALT 21 07/18/2017 1421   ALT 23 08/09/2016 1313   BILITOT 0.4 04/25/2022 0952   BILITOT 0.34 08/09/2016 1313      Impression and Plan: Mr. Barg is a 82 year old male. He has polycythemia. 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 However, phlebotomizing him definitely makes him feel better.  We will go ahead and phlebotomize him today.  Of note, his iron studies have never been a problem.  Again, we will do another PET scan on him in March.  We will plan to get him back in April for another follow-up.  He typically gets phlebotomized when he sees [place].    Volanda Napoleon, MD 1/11/202411:00 AM

## 2022-07-26 NOTE — Patient Instructions (Signed)

## 2022-07-26 NOTE — Progress Notes (Signed)
Robert Tran presents today for phlebotomy per MD orders. Phlebotomy procedure started at 1110 and ended at 1132. 518 grams removed. Patient observed for 30 minutes after procedure without any incident. Patient tolerated procedure well. IV needle removed intact.

## 2022-10-12 ENCOUNTER — Encounter: Payer: Self-pay | Admitting: Hematology & Oncology

## 2022-10-15 ENCOUNTER — Encounter (HOSPITAL_COMMUNITY)
Admission: RE | Admit: 2022-10-15 | Discharge: 2022-10-15 | Disposition: A | Discharge status: Home / Self Care | Payer: Medicare Other | Source: Ambulatory Visit | Attending: Hematology & Oncology | Admitting: Hematology & Oncology

## 2022-10-15 DIAGNOSIS — C3411 Malignant neoplasm of upper lobe, right bronchus or lung: Secondary | ICD-10-CM | POA: Diagnosis not present

## 2022-10-15 LAB — GLUCOSE, CAPILLARY: Glucose-Capillary: 145 mg/dL — ABNORMAL HIGH (ref 70–99)

## 2022-10-15 MED ORDER — FLUDEOXYGLUCOSE F - 18 (FDG) INJECTION
7.5000 | Freq: Once | INTRAVENOUS | Status: AC | PRN
  Administered 2022-10-15: 7.7 via INTRAVENOUS

## 2022-10-25 ENCOUNTER — Inpatient Hospital Stay: Payer: Medicare Other | Admitting: Hematology & Oncology

## 2022-10-25 ENCOUNTER — Inpatient Hospital Stay: Payer: Medicare Other

## 2022-10-25 ENCOUNTER — Inpatient Hospital Stay: Payer: Medicare Other | Attending: Hematology & Oncology

## 2022-10-25 ENCOUNTER — Encounter: Payer: Self-pay | Admitting: Hematology & Oncology

## 2022-10-25 VITALS — BP 110/67 | HR 79

## 2022-10-25 VITALS — BP 127/63 | HR 80 | Temp 97.4°F | Resp 16 | Ht 65.0 in | Wt 155.0 lb

## 2022-10-25 DIAGNOSIS — D45 Polycythemia vera: Secondary | ICD-10-CM | POA: Diagnosis not present

## 2022-10-25 DIAGNOSIS — C3411 Malignant neoplasm of upper lobe, right bronchus or lung: Secondary | ICD-10-CM

## 2022-10-25 LAB — CMP (CANCER CENTER ONLY)
ALT: 8 U/L (ref 0–44)
AST: 8 U/L — ABNORMAL LOW (ref 15–41)
Albumin: 4 g/dL (ref 3.5–5.0)
Alkaline Phosphatase: 64 U/L (ref 38–126)
Anion gap: 7 (ref 5–15)
BUN: 31 mg/dL — ABNORMAL HIGH (ref 8–23)
CO2: 25 mmol/L (ref 22–32)
Calcium: 10.2 mg/dL (ref 8.9–10.3)
Chloride: 105 mmol/L (ref 98–111)
Creatinine: 1.36 mg/dL — ABNORMAL HIGH (ref 0.61–1.24)
GFR, Estimated: 52 mL/min — ABNORMAL LOW (ref >60)
Glucose, Bld: 170 mg/dL — ABNORMAL HIGH (ref 70–99)
Potassium: 4.2 mmol/L (ref 3.5–5.1)
Sodium: 137 mmol/L (ref 135–145)
Total Bilirubin: 0.3 mg/dL (ref 0.3–1.2)
Total Protein: 6.8 g/dL (ref 6.5–8.1)

## 2022-10-25 LAB — IRON AND IRON BINDING CAPACITY (CC-WL,HP ONLY)
Iron: 63 ug/dL (ref 45–182)
Saturation Ratios: 16 % — ABNORMAL LOW (ref 17.9–39.5)
TIBC: 396 ug/dL (ref 250–450)
UIBC: 333 ug/dL (ref 117–376)

## 2022-10-25 LAB — CBC WITH DIFFERENTIAL (CANCER CENTER ONLY)
Abs Immature Granulocytes: 0.06 10*3/uL (ref 0.00–0.07)
Basophils Absolute: 0.1 10*3/uL (ref 0.0–0.1)
Basophils Relative: 1 %
Eosinophils Absolute: 0.3 10*3/uL (ref 0.0–0.5)
Eosinophils Relative: 4 %
HCT: 49.9 % (ref 39.0–52.0)
Hemoglobin: 16.1 g/dL (ref 13.0–17.0)
Immature Granulocytes: 1 %
Lymphocytes Relative: 20 %
Lymphs Abs: 1.6 10*3/uL (ref 0.7–4.0)
MCH: 29.8 pg (ref 26.0–34.0)
MCHC: 32.3 g/dL (ref 30.0–36.0)
MCV: 92.4 fL (ref 80.0–100.0)
Monocytes Absolute: 0.5 10*3/uL (ref 0.1–1.0)
Monocytes Relative: 6 %
Neutro Abs: 5.6 10*3/uL (ref 1.7–7.7)
Neutrophils Relative %: 68 %
Platelet Count: 281 10*3/uL (ref 150–400)
RBC: 5.4 MIL/uL (ref 4.22–5.81)
RDW: 14.4 % (ref 11.5–15.5)
WBC Count: 8.2 10*3/uL (ref 4.0–10.5)
nRBC: 0 % (ref 0.0–0.2)

## 2022-10-25 LAB — RETICULOCYTES
Immature Retic Fract: 20.4 % — ABNORMAL HIGH (ref 2.3–15.9)
RBC.: 5.36 MIL/uL (ref 4.22–5.81)
Retic Count, Absolute: 106.1 10*3/uL (ref 19.0–186.0)
Retic Ct Pct: 2 % (ref 0.4–3.1)

## 2022-10-25 LAB — LACTATE DEHYDROGENASE: LDH: 106 U/L (ref 98–192)

## 2022-10-25 LAB — FERRITIN: Ferritin: 13 ng/mL — ABNORMAL LOW (ref 24–336)

## 2022-10-25 NOTE — Progress Notes (Signed)
Hematology and Oncology Follow Up Visit  Robert Tran [id number] March 04, 1941 82 y.o. 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  We last saw him back in January.  Since then, he has been doing pretty well.  He is still smoking.  His hematocrit is still about 50%.  He will need to be phlebotomized today.  We did do a PET scan on him.  This was done on 10/15/2022.  This showed a nonspecific 5 mm nodule in the right upper lobe.  It had a very low SUV of 1.6.  This was indeterminate.  We will just have to follow this up.  He has had no headache.  He has had no problems with nausea or vomiting.  He has had no fever.  He had no problems with COVID.  Is been no change in bowel or bladder habits.  Overall, his performance status is ECOG 1.   Medications:  Allergies as of 10/25/2022       Reactions   Pravastatin Sodium Other (See Comments)   Patient stated,"it messed up my back and legs, I couldn't walk."   Minocycline Nausea Only, Nausea And Vomiting   Sulfa Antibiotics Nausea Only, Other (See Comments)   Dizziness, nausea   Sulfa Drugs Cross Reactors Nausea Only        Medication List        Accurate as of October 25, 2022 12:20 PM. If you have any questions, ask your nurse or doctor.          albuterol 108 (90 Base) MCG/ACT inhaler Commonly known as: VENTOLIN HFA Inhale 2 puffs into the lungs every 6 (six) hours as needed for wheezing or shortness of breath.   amLODipine 10 MG tablet Commonly known as: NORVASC Take 10 mg by mouth daily.   aspirin EC 81 MG tablet Take 81 mg by mouth daily.    clopidogrel 75 MG tablet Commonly known as: Plavix Take 1 tablet (75 mg total) by mouth daily. Okay to restart this medication on 02/20/2020.   empagliflozin 10 MG Tabs tablet Commonly known as: JARDIANCE Take 1 tablet by mouth daily.   fluocinonide 0.05 % external solution Commonly known as: LIDEX Apply 1 application topically every other day.   folic acid 400 MCG tablet Commonly known as: FOLVITE Take 400 mcg by mouth every morning.   hydrocortisone 2.5 % cream 1 application daily as needed.   metFORMIN 500 MG 24 hr tablet Commonly known as: GLUCOPHAGE-XR Take 1,000 mg by mouth at bedtime.   metoprolol succinate 25 MG 24 hr tablet Commonly known as: TOPROL-XL Take 25 mg by mouth daily.   nicotine 21 mg/24hr patch Commonly known as: NICODERM CQ - dosed in mg/24 hours Place onto the skin.   ramipril 10 MG tablet Commonly known as: ALTACE Take 10 mg by mouth daily.   tamsulosin 0.4 MG Caps capsule Commonly known as: FLOMAX Take 0.4 mg by mouth daily as needed (kidney stones).   torsemide 10 MG tablet Commonly known as: DEMADEX   Vitamin D 125 MCG (5000 UT) Caps Take 5,000 Units by mouth daily.        Allergies:  Allergies  Allergen Reactions   Pravastatin Sodium Other (See Comments)    Patient stated,"it messed up my back and legs, I couldn't walk."   Minocycline Nausea Only and Nausea And Vomiting   Sulfa Antibiotics Nausea Only and Other (See Comments)    Dizziness, nausea   Sulfa Drugs Cross Reactors Nausea Only    Past Medical History, Surgical history, Social history, and Family History were reviewed and updated.  Review of Systems: Review of Systems  Constitutional: Negative.   HENT: Negative.    Eyes: Negative.   Respiratory: Negative.    Cardiovascular: Negative.   Gastrointestinal: Negative.   Genitourinary: Negative.   Musculoskeletal: Negative.   Skin: Negative.   Neurological: Negative.   Endo/Heme/Allergies: Negative.    Psychiatric/Behavioral: Negative.      Physical Exam:  height is 5\' 5"  (1.651 m) and weight is 155 lb (70.3 kg). His oral temperature is 97.4 F (36.3 C) (abnormal). His blood pressure is 127/63 and his pulse is 80. His respiration is 16 and oxygen saturation is 95%.   Wt Readings from Last 3 Encounters:  10/25/22 155 lb (70.3 kg)  07/26/22 155 lb (70.3 kg)  04/25/22 156 lb 6.4 oz (70.9 kg)    Physical Exam Vitals reviewed.  HENT:     Head: Normocephalic and atraumatic.  Eyes:     Pupils: Pupils are equal, round, and reactive to light.  Cardiovascular:     Rate and Rhythm: Normal rate and regular rhythm.     Heart sounds: Normal heart sounds.  Pulmonary:     Effort: Pulmonary effort is normal.     Breath sounds: Normal breath sounds.  Abdominal:     General: Bowel sounds are normal.     Palpations: Abdomen is soft.  Musculoskeletal:        General: No tenderness or deformity. Normal range of motion.     Cervical back: Normal range of motion.  Lymphadenopathy:     Cervical: No cervical adenopathy.  Skin:    General: Skin is warm and dry.     Findings: No erythema or rash.  Neurological:     Mental Status: He is alert and oriented to person, place, and time.  Psychiatric:        Behavior: Behavior normal.        Thought Content: Thought content normal.        Judgment: Judgment normal.     Lab Results  Component Value Date   WBC 8.2 10/25/2022   HGB 16.1 10/25/2022   HCT 49.9 10/25/2022   MCV 92.4 10/25/2022   PLT 281 10/25/2022   Lab Results  Component Value Date   FERRITIN 15 (L) 07/26/2022   IRON 55 07/26/2022   TIBC 364 07/26/2022   UIBC 309 07/26/2022   IRONPCTSAT 15 (L) 07/26/2022   Lab Results  Component Value Date   RETICCTPCT 2.0 10/25/2022   RBC 5.40 10/25/2022   RBC 5.36 10/25/2022   RETICCTABS 77.3 06/20/2015   No results found for: "KPAFRELGTCHN", "LAMBDASER", "KAPLAMBRATIO" No results found for: "IGGSERUM", "IGA", "IGMSERUM" No  results found for: "TOTALPROTELP", "ALBUMINELP", "A1GS", "A2GS", "BETS", "BETA2SER", "GAMS", "MSPIKE", "SPEI"   Chemistry      Component Value Date/Time   NA 137 07/26/2022 1022   NA 142 07/18/2017 1421   NA 134 (L) 08/09/2016 1313   K 4.6 07/26/2022 1022   K 4.0 07/18/2017 1421   K 4.2 08/09/2016 1313   CL 106 07/26/2022 1022   CL 103 07/18/2017 1421  CO2 24 07/26/2022 1022   CO2 29 07/18/2017 1421   CO2 20 (L) 08/09/2016 1313   BUN 30 (H) 07/26/2022 1022   BUN 19 07/18/2017 1421   BUN 19.9 08/09/2016 1313   CREATININE 1.38 (H) 07/26/2022 1022   CREATININE 1.4 (H) 07/18/2017 1421   CREATININE 1.3 08/09/2016 1313      Component Value Date/Time   CALCIUM 10.2 07/26/2022 1022   CALCIUM 10.2 07/18/2017 1421   CALCIUM 10.0 08/09/2016 1313   ALKPHOS 58 07/26/2022 1022   ALKPHOS 65 07/18/2017 1421   ALKPHOS 75 08/09/2016 1313   AST 8 (L) 07/26/2022 1022   AST 14 08/09/2016 1313   ALT 9 07/26/2022 1022   ALT 21 07/18/2017 1421   ALT 23 08/09/2016 1313   BILITOT 0.4 07/26/2022 1022   BILITOT 0.34 08/09/2016 1313      Impression and Plan: Robert Tran is a 82 year old male. He has polycythemia. 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 However, phlebotomizing him definitely makes him feel better.  As always, we will have to do another scan on him.  Will do a PET scan probably in about 2 or 3 months to look at this nodule in the right upper lobe.  Again, smoking is certainly a risk factor that he has for lung cancer.  He will be phlebotomized today.  Again we will get him back in the Summer.  Do the PET scan before we see him back.   Robert Macho, MD 4/11/202412:20 PM

## 2022-10-25 NOTE — Progress Notes (Signed)
1 unit therapeutic phlebotomy performed over 19 minutes using a 20 gauge IV catheter to the left AC. Patient tolerated well. Nourishment provided.  Patient does not want to stay for the 30 minute post phlebotomy observation. VSS. Patient discharged ambulatory without complaints or concerns.

## 2022-10-25 NOTE — Patient Instructions (Signed)

## 2023-01-22 ENCOUNTER — Encounter (HOSPITAL_COMMUNITY)
Admission: RE | Admit: 2023-01-22 | Discharge: 2023-01-22 | Disposition: A | Discharge status: Home / Self Care | Payer: Medicare Other | Source: Ambulatory Visit | Attending: Hematology & Oncology | Admitting: Hematology & Oncology

## 2023-01-22 DIAGNOSIS — C3411 Malignant neoplasm of upper lobe, right bronchus or lung: Secondary | ICD-10-CM | POA: Insufficient documentation

## 2023-01-22 LAB — GLUCOSE, CAPILLARY: Glucose-Capillary: 93 mg/dL (ref 70–99)

## 2023-01-22 MED ORDER — FLUDEOXYGLUCOSE F - 18 (FDG) INJECTION
7.4400 | Freq: Once | INTRAVENOUS | Status: AC
  Administered 2023-01-22: 7.44 via INTRAVENOUS

## 2023-01-24 ENCOUNTER — Inpatient Hospital Stay: Payer: Medicare Other | Attending: Hematology & Oncology

## 2023-01-24 ENCOUNTER — Inpatient Hospital Stay: Payer: Medicare Other

## 2023-01-24 ENCOUNTER — Inpatient Hospital Stay: Payer: Medicare Other | Admitting: Hematology & Oncology

## 2023-01-24 ENCOUNTER — Encounter: Payer: Self-pay | Admitting: Hematology & Oncology

## 2023-01-24 VITALS — BP 97/67 | HR 81

## 2023-01-24 VITALS — BP 135/70 | HR 77 | Temp 97.7°F | Resp 20 | Ht 65.0 in | Wt 153.5 lb

## 2023-01-24 DIAGNOSIS — D45 Polycythemia vera: Secondary | ICD-10-CM | POA: Diagnosis present

## 2023-01-24 DIAGNOSIS — Z7902 Long term (current) use of antithrombotics/antiplatelets: Secondary | ICD-10-CM | POA: Diagnosis not present

## 2023-01-24 DIAGNOSIS — C3411 Malignant neoplasm of upper lobe, right bronchus or lung: Secondary | ICD-10-CM

## 2023-01-24 LAB — CBC WITH DIFFERENTIAL (CANCER CENTER ONLY)
Abs Immature Granulocytes: 0.06 10*3/uL (ref 0.00–0.07)
Basophils Absolute: 0.1 10*3/uL (ref 0.0–0.1)
Basophils Relative: 1 %
Eosinophils Absolute: 0.4 10*3/uL (ref 0.0–0.5)
Eosinophils Relative: 4 %
HCT: 49.8 % (ref 39.0–52.0)
Hemoglobin: 15.8 g/dL (ref 13.0–17.0)
Immature Granulocytes: 1 %
Lymphocytes Relative: 20 %
Lymphs Abs: 1.6 10*3/uL (ref 0.7–4.0)
MCH: 29.5 pg (ref 26.0–34.0)
MCHC: 31.7 g/dL (ref 30.0–36.0)
MCV: 93.1 fL (ref 80.0–100.0)
Monocytes Absolute: 0.6 10*3/uL (ref 0.1–1.0)
Monocytes Relative: 8 %
Neutro Abs: 5.5 10*3/uL (ref 1.7–7.7)
Neutrophils Relative %: 66 %
Platelet Count: 262 10*3/uL (ref 150–400)
RBC: 5.35 MIL/uL (ref 4.22–5.81)
RDW: 15.1 % (ref 11.5–15.5)
WBC Count: 8.2 10*3/uL (ref 4.0–10.5)
nRBC: 0 % (ref 0.0–0.2)

## 2023-01-24 LAB — CMP (CANCER CENTER ONLY)
ALT: 10 U/L (ref 0–44)
AST: 10 U/L — ABNORMAL LOW (ref 15–41)
Albumin: 4.2 g/dL (ref 3.5–5.0)
Alkaline Phosphatase: 63 U/L (ref 38–126)
Anion gap: 7 (ref 5–15)
BUN: 30 mg/dL — ABNORMAL HIGH (ref 8–23)
CO2: 23 mmol/L (ref 22–32)
Calcium: 10.1 mg/dL (ref 8.9–10.3)
Chloride: 108 mmol/L (ref 98–111)
Creatinine: 1.47 mg/dL — ABNORMAL HIGH (ref 0.61–1.24)
GFR, Estimated: 47 mL/min — ABNORMAL LOW (ref >60)
Glucose, Bld: 185 mg/dL — ABNORMAL HIGH (ref 70–99)
Potassium: 4.6 mmol/L (ref 3.5–5.1)
Sodium: 138 mmol/L (ref 135–145)
Total Bilirubin: 0.4 mg/dL (ref 0.3–1.2)
Total Protein: 6.7 g/dL (ref 6.5–8.1)

## 2023-01-24 LAB — IRON AND IRON BINDING CAPACITY (CC-WL,HP ONLY)
Iron: 98 ug/dL (ref 45–182)
Saturation Ratios: 26 % (ref 17.9–39.5)
TIBC: 382 ug/dL (ref 250–450)
UIBC: 284 ug/dL (ref 117–376)

## 2023-01-24 LAB — FERRITIN: Ferritin: 15 ng/mL — ABNORMAL LOW (ref 24–336)

## 2023-01-24 NOTE — Patient Instructions (Signed)

## 2023-01-24 NOTE — Progress Notes (Signed)
1 unit therapeutic phlebotomy performed over 15 minutes using a 18 gauge IV catheter to the left AC. Patient tolerated well. Nourishment provided. Patient does not want to stay for the 30 minute post phlebotomy observation. Patient discharged ambulatory without complaints or concerns.

## 2023-01-24 NOTE — Progress Notes (Signed)
Hematology and Oncology Follow Up Visit  Robert Tran [id number] [birth year] 82 y.o. 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 is back for follow-up.  We see him every 3 months.  Since we last saw him, he and his wife have been quite busy.  They did have a nice RV at the drive down to [place].  They have a wonderful time down there.  He did have a PET scan done recently.  Unfortunately, the results not yet back.  I did review the PET scan.  He still had this right upper lobe nodule.  Is seen to have very small amount of SUV.  I will have to await the final radiology report.  He is still smoking.  He is try to cut back.  He does have the polycythemia.  He now has total disability from the [place].  I am so glad that the gave him this.  He has had no cough.  He has had no nausea or vomiting.  There is been no change in bowel or bladder habits.  Currently, I would say his performance status is probably ECOG 1.    Medications:  Allergies as of 01/24/2023       Reactions   Pravastatin Sodium Other (See Comments)   Patient stated,"it messed up my back and legs, I couldn't walk."   Minocycline Nausea Only, Nausea And Vomiting   Sulfa Antibiotics Nausea Only, Other (See Comments)   Dizziness, nausea   Sulfa Drugs Cross Reactors Nausea Only        Medication List        Accurate as of January 24, 2023 11:31 AM. If you have any questions, ask your nurse or doctor.          STOP taking these medications    torsemide 10 MG tablet Commonly known as: DEMADEX Stopped by: Josph Macho       TAKE these medications     albuterol 108 (90 Base) MCG/ACT inhaler Commonly known as: VENTOLIN HFA Inhale 2 puffs into the lungs every 6 (six) hours as needed for wheezing or shortness of breath.   amLODipine 10 MG tablet Commonly known as: NORVASC Take 10 mg by mouth daily.   aspirin EC 81 MG tablet Take 81 mg by mouth daily.   clopidogrel 75 MG tablet Commonly known as: Plavix Take 1 tablet (75 mg total) by mouth daily. Okay to restart this medication on 02/20/2020.   empagliflozin 10 MG Tabs tablet Commonly known as: JARDIANCE Take 1 tablet by mouth daily.   fluocinonide 0.05 % external solution Commonly known as: LIDEX Apply 1 application topically every other day.   folic acid 400 MCG tablet Commonly known as: FOLVITE Take 400 mcg by mouth every morning.   hydrocortisone 2.5 % cream 1 application daily as needed.   LEQVIO [place] Inject into the skin. Receives injection in Dr. Ledell Noss office in [facility]   metFORMIN 500 MG 24 hr tablet Commonly known as: GLUCOPHAGE-XR Take 1,000 mg by mouth at bedtime.   metoprolol succinate 25 MG 24 hr tablet Commonly known as: TOPROL-XL Take 25 mg by mouth daily.   nicotine 21 mg/24hr patch  Commonly known as: NICODERM CQ - dosed in mg/24 hours Place onto the skin.   ramipril 10 MG tablet Commonly known as: ALTACE Take 10 mg by mouth daily.   tamsulosin 0.4 MG Caps capsule Commonly known as: FLOMAX Take 0.4 mg by mouth daily as needed (kidney stones).   tiotropium 18 MCG inhalation capsule Commonly known as: SPIRIVA Place 18 mcg into inhaler and inhale daily.   Vitamin D 125 MCG (5000 UT) Caps Take 5,000 Units by mouth daily.        Allergies:  Allergies  Allergen Reactions   Pravastatin Sodium Other (See Comments)    Patient stated,"it messed up my back and legs, I couldn't walk."   Minocycline Nausea Only and Nausea And Vomiting   Sulfa Antibiotics Nausea Only and Other (See Comments)    Dizziness, nausea   Sulfa Drugs Cross  Reactors Nausea Only    Past Medical History, Surgical history, Social history, and Family History were reviewed and updated.  Review of Systems: Review of Systems  Constitutional: Negative.   HENT: Negative.    Eyes: Negative.   Respiratory: Negative.    Cardiovascular: Negative.   Gastrointestinal: Negative.   Genitourinary: Negative.   Musculoskeletal: Negative.   Skin: Negative.   Neurological: Negative.   Endo/Heme/Allergies: Negative.   Psychiatric/Behavioral: Negative.      Physical Exam:  height is 5\' 5"  (1.651 m) and weight is 153 lb 8 oz (69.6 kg). His oral temperature is 97.7 F (36.5 C). His blood pressure is 135/70 and his pulse is 77. His respiration is 20 and oxygen saturation is 98%.   Wt Readings from Last 3 Encounters:  01/24/23 153 lb 8 oz (69.6 kg)  10/25/22 155 lb (70.3 kg)  07/26/22 155 lb (70.3 kg)    Physical Exam Vitals reviewed.  HENT:     Head: Normocephalic and atraumatic.  Eyes:     Pupils: Pupils are equal, round, and reactive to light.  Cardiovascular:     Rate and Rhythm: Normal rate and regular rhythm.     Heart sounds: Normal heart sounds.  Pulmonary:     Effort: Pulmonary effort is normal.     Breath sounds: Normal breath sounds.  Abdominal:     General: Bowel sounds are normal.     Palpations: Abdomen is soft.  Musculoskeletal:        General: No tenderness or deformity. Normal range of motion.     Cervical back: Normal range of motion.  Lymphadenopathy:     Cervical: No cervical adenopathy.  Skin:    General: Skin is warm and dry.     Findings: No erythema or rash.  Neurological:     Mental Status: He is alert and oriented to person, place, and time.  Psychiatric:        Behavior: Behavior normal.        Thought Content: Thought content normal.        Judgment: Judgment normal.      Lab Results  Component Value Date   WBC 8.2 01/24/2023   HGB 15.8 01/24/2023   HCT 49.8 01/24/2023   MCV 93.1 01/24/2023   PLT 262  01/24/2023   Lab Results  Component Value Date   FERRITIN 13 (L) 10/25/2022   IRON 63 10/25/2022   TIBC 396 10/25/2022   UIBC 333 10/25/2022   IRONPCTSAT 16 (L) 10/25/2022   Lab Results  Component Value Date   RETICCTPCT 2.0 10/25/2022   RBC 5.35 01/24/2023   RETICCTABS  77.3 06/20/2015   No results found for: "KPAFRELGTCHN", "LAMBDASER", "KAPLAMBRATIO" No results found for: "IGGSERUM", "IGA", "IGMSERUM" No results found for: "TOTALPROTELP", "ALBUMINELP", "A1GS", "A2GS", "BETS", "BETA2SER", "GAMS", "MSPIKE", "SPEI"   Chemistry      Component Value Date/Time   NA 138 01/24/2023 1002   NA 142 07/18/2017 1421   NA 134 (L) 08/09/2016 1313   K 4.6 01/24/2023 1002   K 4.0 07/18/2017 1421   K 4.2 08/09/2016 1313   CL 108 01/24/2023 1002   CL 103 07/18/2017 1421   CO2 23 01/24/2023 1002   CO2 29 07/18/2017 1421   CO2 20 (L) 08/09/2016 1313   BUN 30 (H) 01/24/2023 1002   BUN 19 07/18/2017 1421   BUN 19.9 08/09/2016 1313   CREATININE 1.47 (H) 01/24/2023 1002   CREATININE 1.4 (H) 07/18/2017 1421   CREATININE 1.3 08/09/2016 1313      Component Value Date/Time   CALCIUM 10.1 01/24/2023 1002   CALCIUM 10.2 07/18/2017 1421   CALCIUM 10.0 08/09/2016 1313   ALKPHOS 63 01/24/2023 1002   ALKPHOS 65 07/18/2017 1421   ALKPHOS 75 08/09/2016 1313   AST 10 (L) 01/24/2023 1002   AST 14 08/09/2016 1313   ALT 10 01/24/2023 1002   ALT 21 07/18/2017 1421   ALT 23 08/09/2016 1313   BILITOT 0.4 01/24/2023 1002   BILITOT 0.34 08/09/2016 1313      Impression and Plan: Mr. Jodoin is a 82 year old male. He has polycythemia. 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 However, phlebotomizing him definitely makes him feel better.  I will await the report from the radiologist about the PET scan.  If there is any change, we may have to think about getting back to Radiation Oncology to see about radiosurgery if that is a possibility.  We clearly have to phlebotomize him today.  I will still plan to  get him back in 3 months.  If we have to do another PET scan, we can set this up.    Josph Macho, MD 7/11/202411:31 AM

## 2023-01-28 ENCOUNTER — Telehealth: Payer: Self-pay

## 2023-01-28 NOTE — Telephone Encounter (Signed)
-----   Message from Robert Tran sent at 01/28/2023  6:23 AM EDT ----- Call and let him know that the PET scan does not show any obvious activity for the lung cancer.  He may have a thyroid goiter.  I think we can just watch this.  Cindee Lame

## 2023-01-28 NOTE — Telephone Encounter (Signed)
 Message sent

## 2023-03-13 ENCOUNTER — Encounter: Payer: Self-pay | Admitting: Hematology & Oncology

## 2023-04-01 DIAGNOSIS — M609 Myositis, unspecified: Secondary | ICD-10-CM | POA: Insufficient documentation

## 2023-04-26 ENCOUNTER — Inpatient Hospital Stay: Payer: Medicare Other | Attending: Hematology & Oncology

## 2023-04-26 ENCOUNTER — Encounter: Payer: Self-pay | Admitting: Hematology & Oncology

## 2023-04-26 ENCOUNTER — Inpatient Hospital Stay: Payer: Medicare Other

## 2023-04-26 ENCOUNTER — Inpatient Hospital Stay (HOSPITAL_BASED_OUTPATIENT_CLINIC_OR_DEPARTMENT_OTHER): Payer: Medicare Other | Admitting: Hematology & Oncology

## 2023-04-26 VITALS — BP 140/59 | HR 72 | Temp 97.6°F | Resp 20 | Ht 65.0 in | Wt 155.0 lb

## 2023-04-26 DIAGNOSIS — E7801 Familial hypercholesterolemia: Secondary | ICD-10-CM | POA: Diagnosis not present

## 2023-04-26 DIAGNOSIS — Z7902 Long term (current) use of antithrombotics/antiplatelets: Secondary | ICD-10-CM | POA: Insufficient documentation

## 2023-04-26 DIAGNOSIS — C3411 Malignant neoplasm of upper lobe, right bronchus or lung: Secondary | ICD-10-CM

## 2023-04-26 DIAGNOSIS — Z85118 Personal history of other malignant neoplasm of bronchus and lung: Secondary | ICD-10-CM | POA: Diagnosis not present

## 2023-04-26 DIAGNOSIS — D45 Polycythemia vera: Secondary | ICD-10-CM

## 2023-04-26 LAB — CMP (CANCER CENTER ONLY)
ALT: 7 U/L (ref 0–44)
AST: 9 U/L — ABNORMAL LOW (ref 15–41)
Albumin: 4 g/dL (ref 3.5–5.0)
Alkaline Phosphatase: 57 U/L (ref 38–126)
Anion gap: 6 (ref 5–15)
BUN: 19 mg/dL (ref 8–23)
CO2: 27 mmol/L (ref 22–32)
Calcium: 9.9 mg/dL (ref 8.9–10.3)
Chloride: 106 mmol/L (ref 98–111)
Creatinine: 1.28 mg/dL — ABNORMAL HIGH (ref 0.61–1.24)
GFR, Estimated: 56 mL/min — ABNORMAL LOW (ref >60)
Glucose, Bld: 160 mg/dL — ABNORMAL HIGH (ref 70–99)
Potassium: 4.8 mmol/L (ref 3.5–5.1)
Sodium: 139 mmol/L (ref 135–145)
Total Bilirubin: 0.4 mg/dL (ref 0.3–1.2)
Total Protein: 6.7 g/dL (ref 6.5–8.1)

## 2023-04-26 LAB — CBC WITH DIFFERENTIAL (CANCER CENTER ONLY)
Abs Immature Granulocytes: 0.05 10*3/uL (ref 0.00–0.07)
Basophils Absolute: 0.1 10*3/uL (ref 0.0–0.1)
Basophils Relative: 1 %
Eosinophils Absolute: 0.3 10*3/uL (ref 0.0–0.5)
Eosinophils Relative: 4 %
HCT: 49.6 % (ref 39.0–52.0)
Hemoglobin: 16.1 g/dL (ref 13.0–17.0)
Immature Granulocytes: 1 %
Lymphocytes Relative: 20 %
Lymphs Abs: 1.6 10*3/uL (ref 0.7–4.0)
MCH: 29.5 pg (ref 26.0–34.0)
MCHC: 32.5 g/dL (ref 30.0–36.0)
MCV: 90.8 fL (ref 80.0–100.0)
Monocytes Absolute: 0.5 10*3/uL (ref 0.1–1.0)
Monocytes Relative: 6 %
Neutro Abs: 5.4 10*3/uL (ref 1.7–7.7)
Neutrophils Relative %: 68 %
Platelet Count: 252 10*3/uL (ref 150–400)
RBC: 5.46 MIL/uL (ref 4.22–5.81)
RDW: 14.7 % (ref 11.5–15.5)
WBC Count: 7.9 10*3/uL (ref 4.0–10.5)
nRBC: 0 % (ref 0.0–0.2)

## 2023-04-26 LAB — FERRITIN: Ferritin: 12 ng/mL — ABNORMAL LOW (ref 24–336)

## 2023-04-26 LAB — LIPID PANEL
Cholesterol: 129 mg/dL (ref 0–200)
HDL: 35 mg/dL — ABNORMAL LOW (ref >40)
LDL Cholesterol: 67 mg/dL (ref 0–99)
Total CHOL/HDL Ratio: 3.7 {ratio}
Triglycerides: 137 mg/dL (ref <150)
VLDL: 27 mg/dL (ref 0–40)

## 2023-04-26 LAB — IRON AND IRON BINDING CAPACITY (CC-WL,HP ONLY)
Iron: 69 ug/dL (ref 45–182)
Saturation Ratios: 17 % — ABNORMAL LOW (ref 17.9–39.5)
TIBC: 410 ug/dL (ref 250–450)
UIBC: 341 ug/dL (ref 117–376)

## 2023-04-26 MED ORDER — PANTOPRAZOLE SODIUM 40 MG PO TBEC: 40.0000 mg | DELAYED_RELEASE_TABLET | Freq: Two times a day (BID) | ORAL | 6 refills | Status: AC

## 2023-04-26 NOTE — Patient Instructions (Signed)
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
## 2023-04-26 NOTE — Progress Notes (Signed)
Robert Tran presents today for phlebotomy per MD orders. Phlebotomy procedure started at 1038 and ended at 1055. 517 grams removed from rt AC using 18g by Joneen Caraway, RN.  Patient observed for 30 minutes after procedure without any incident. Patient tolerated procedure well. IV needle removed intact.

## 2023-04-29 ENCOUNTER — Encounter: Payer: Self-pay | Admitting: Hematology & Oncology

## 2023-06-24 ENCOUNTER — Encounter: Payer: Self-pay | Admitting: Hematology & Oncology

## 2023-07-18 ENCOUNTER — Encounter: Payer: Self-pay | Admitting: Hematology & Oncology

## 2023-07-22 ENCOUNTER — Encounter (HOSPITAL_COMMUNITY)
Admission: RE | Admit: 2023-07-22 | Discharge: 2023-07-22 | Disposition: A | Discharge status: Home / Self Care | Payer: Medicare Other | Source: Ambulatory Visit | Attending: Hematology & Oncology | Admitting: Hematology & Oncology

## 2023-07-22 DIAGNOSIS — E7801 Familial hypercholesterolemia: Secondary | ICD-10-CM | POA: Insufficient documentation

## 2023-07-22 DIAGNOSIS — C3411 Malignant neoplasm of upper lobe, right bronchus or lung: Secondary | ICD-10-CM | POA: Diagnosis present

## 2023-07-22 LAB — GLUCOSE, CAPILLARY: Glucose-Capillary: 153 mg/dL — ABNORMAL HIGH (ref 70–99)

## 2023-07-22 MED ORDER — FLUDEOXYGLUCOSE F - 18 (FDG) INJECTION
7.7000 | Freq: Once | INTRAVENOUS | Status: AC
  Administered 2023-07-22: 7.7 via INTRAVENOUS

## 2023-08-02 ENCOUNTER — Inpatient Hospital Stay (HOSPITAL_BASED_OUTPATIENT_CLINIC_OR_DEPARTMENT_OTHER): Payer: Medicare Other | Admitting: Hematology & Oncology

## 2023-08-02 ENCOUNTER — Encounter: Payer: Self-pay | Admitting: Hematology & Oncology

## 2023-08-02 ENCOUNTER — Inpatient Hospital Stay: Payer: Medicare Other

## 2023-08-02 ENCOUNTER — Other Ambulatory Visit: Payer: Self-pay

## 2023-08-02 ENCOUNTER — Inpatient Hospital Stay: Payer: Medicare Other | Attending: Hematology & Oncology

## 2023-08-02 VITALS — BP 116/55 | HR 78 | Temp 97.7°F | Resp 18 | Ht 65.0 in | Wt 157.0 lb

## 2023-08-02 DIAGNOSIS — D45 Polycythemia vera: Secondary | ICD-10-CM | POA: Diagnosis present

## 2023-08-02 DIAGNOSIS — C3411 Malignant neoplasm of upper lobe, right bronchus or lung: Secondary | ICD-10-CM | POA: Diagnosis not present

## 2023-08-02 DIAGNOSIS — Z85118 Personal history of other malignant neoplasm of bronchus and lung: Secondary | ICD-10-CM | POA: Insufficient documentation

## 2023-08-02 DIAGNOSIS — Z08 Encounter for follow-up examination after completed treatment for malignant neoplasm: Secondary | ICD-10-CM | POA: Diagnosis not present

## 2023-08-02 DIAGNOSIS — F17210 Nicotine dependence, cigarettes, uncomplicated: Secondary | ICD-10-CM | POA: Diagnosis not present

## 2023-08-02 LAB — LACTATE DEHYDROGENASE: LDH: 102 U/L (ref 98–192)

## 2023-08-02 LAB — CBC WITH DIFFERENTIAL (CANCER CENTER ONLY)
Abs Immature Granulocytes: 0.07 10*3/uL (ref 0.00–0.07)
Basophils Absolute: 0.1 10*3/uL (ref 0.0–0.1)
Basophils Relative: 1 %
Eosinophils Absolute: 0.4 10*3/uL (ref 0.0–0.5)
Eosinophils Relative: 4 %
HCT: 45 % (ref 39.0–52.0)
Hemoglobin: 14.1 g/dL (ref 13.0–17.0)
Immature Granulocytes: 1 %
Lymphocytes Relative: 21 %
Lymphs Abs: 1.9 10*3/uL (ref 0.7–4.0)
MCH: 27.4 pg (ref 26.0–34.0)
MCHC: 31.3 g/dL (ref 30.0–36.0)
MCV: 87.5 fL (ref 80.0–100.0)
Monocytes Absolute: 0.7 10*3/uL (ref 0.1–1.0)
Monocytes Relative: 8 %
Neutro Abs: 6 10*3/uL (ref 1.7–7.7)
Neutrophils Relative %: 65 %
Platelet Count: 309 10*3/uL (ref 150–400)
RBC: 5.14 MIL/uL (ref 4.22–5.81)
RDW: 15.8 % — ABNORMAL HIGH (ref 11.5–15.5)
WBC Count: 9.2 10*3/uL (ref 4.0–10.5)
nRBC: 0 % (ref 0.0–0.2)

## 2023-08-02 LAB — CMP (CANCER CENTER ONLY)
ALT: 8 U/L (ref 0–44)
AST: 9 U/L — ABNORMAL LOW (ref 15–41)
Albumin: 4.2 g/dL (ref 3.5–5.0)
Alkaline Phosphatase: 72 U/L (ref 38–126)
Anion gap: 7 (ref 5–15)
BUN: 29 mg/dL — ABNORMAL HIGH (ref 8–23)
CO2: 23 mmol/L (ref 22–32)
Calcium: 10.1 mg/dL (ref 8.9–10.3)
Chloride: 107 mmol/L (ref 98–111)
Creatinine: 1.49 mg/dL — ABNORMAL HIGH (ref 0.61–1.24)
GFR, Estimated: 47 mL/min — ABNORMAL LOW (ref >60)
Glucose, Bld: 138 mg/dL — ABNORMAL HIGH (ref 70–99)
Potassium: 4.9 mmol/L (ref 3.5–5.1)
Sodium: 137 mmol/L (ref 135–145)
Total Bilirubin: 0.4 mg/dL (ref 0.0–1.2)
Total Protein: 6.7 g/dL (ref 6.5–8.1)

## 2023-08-02 MED ORDER — METHYLPREDNISOLONE 4 MG PO TBPK: ORAL_TABLET | ORAL | 0 refills | Status: DC

## 2023-08-02 MED ORDER — CEFDINIR 300 MG PO CAPS: 600.0000 mg | ORAL_CAPSULE | Freq: Every day | ORAL | 0 refills | Status: DC

## 2023-08-02 NOTE — Progress Notes (Signed)
Hematology and Oncology Follow Up Visit  Emani Afable [id number] September 03, 1940 83 y.o. 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 Bagnoli is back for follow-up.  As always, he is doing quite nicely.  He had no problems over the Holiday season.  He and his wife like to go to the beach.  They actually read a house down there.  He had a PET scan that was done.  This was done about a week or so ago.  Everything looks okay on the PET scan however, it is mentioned that there is an area of ground glass infiltrate in the right upper lobe.  Measures 10 mm.  Has an SUV of 2.7.  I think we are going to have to watch this.  He has had no cough.  He is only smoking half a cigarette a day now.  He has had no issues with nausea or vomiting.  He has had no change in bowel or bladder habits.  He has had no headache.  He has had no leg swelling.  Overall, I would say that his performance status is probably ECOG 1.   Medications:  Allergies as of 08/02/2023       Reactions   Pravastatin Sodium Other (See Comments)   Patient stated,"it messed up my back and legs, I couldn't walk."   Minocycline Nausea Only, Nausea And Vomiting   Sulfa Antibiotics Nausea Only, Other (See Comments)   Dizziness, nausea   Sulfa Drugs Cross Reactors Nausea Only        Medication List        Accurate as of August 02, 2023 10:03 AM. If you have any questions, ask your nurse or doctor.          STOP taking these medications    nicotine 21 mg/24hr patch Commonly known as: NICODERM CQ - dosed in mg/24 hours Stopped by: Josph Macho       TAKE these medications     albuterol 108 (90 Base) MCG/ACT inhaler Commonly known as: VENTOLIN HFA Inhale 2 puffs into the lungs every 6 (six) hours as needed for wheezing or shortness of breath.   amLODipine 10 MG tablet Commonly known as: NORVASC Take 10 mg by mouth daily.   aspirin EC 81 MG tablet Take 81 mg by mouth daily.   clopidogrel 75 MG tablet Commonly known as: Plavix Take 1 tablet (75 mg total) by mouth daily. Okay to restart this medication on 02/20/2020.   empagliflozin 10 MG Tabs tablet Commonly known as: JARDIANCE Take 1 tablet by mouth daily.   fluocinonide 0.05 % external solution Commonly known as: LIDEX Apply 1 application topically every other day.   folic acid 400 MCG tablet Commonly known as: FOLVITE Take 400 mcg by mouth every morning.   hydrocortisone 2.5 % cream 1 application daily as needed.   LEQVIO  Inject into the skin. Receives injection in Dr. Ledell Noss office in [facility].   metFORMIN 500 MG 24 hr tablet Commonly known as: GLUCOPHAGE-XR Take 1,000 mg by mouth at bedtime.   metoprolol succinate 25 MG 24 hr tablet Commonly known as: TOPROL-XL Take 25 mg by mouth daily.   pantoprazole 40  MG tablet Commonly known as: Protonix Take 1 tablet (40 mg total) by mouth 2 (two) times daily.   ramipril 10 MG tablet Commonly known as: ALTACE Take 10 mg by mouth daily.   tamsulosin 0.4 MG Caps capsule Commonly known as: FLOMAX Take 0.4 mg by mouth daily as needed (kidney stones).   tiotropium 18 MCG inhalation capsule Commonly known as: SPIRIVA Place 18 mcg into inhaler and inhale daily. Only  uses prn.   Vitamin D 125 MCG (5000 UT) Caps Take 5,000 Units by mouth daily.        Allergies:  Allergies  Allergen Reactions   Pravastatin Sodium Other (See Comments)    Patient stated,"it messed up my back and legs, I couldn't walk."   Minocycline Nausea Only and Nausea And Vomiting   Sulfa Antibiotics Nausea Only and Other (See Comments)    Dizziness, nausea    Sulfa Drugs Cross Reactors Nausea Only    Past Medical History, Surgical history, Social history, and Family History were reviewed and updated.  Review of Systems: Review of Systems  Constitutional: Negative.   HENT: Negative.    Eyes: Negative.   Respiratory: Negative.    Cardiovascular: Negative.   Gastrointestinal: Negative.   Genitourinary: Negative.   Musculoskeletal: Negative.   Skin: Negative.   Neurological: Negative.   Endo/Heme/Allergies: Negative.   Psychiatric/Behavioral: Negative.      Physical Exam:  height is 5\' 5"  (1.651 m) and weight is 157 lb (71.2 kg). His oral temperature is 97.7 F (36.5 C). His blood pressure is 116/55 (abnormal) and his pulse is 78. His respiration is 18 and oxygen saturation is 96%.   Wt Readings from Last 3 Encounters:  08/02/23 157 lb (71.2 kg)  04/26/23 155 lb (70.3 kg)  01/24/23 153 lb 8 oz (69.6 kg)    Physical Exam Vitals reviewed.  HENT:     Head: Normocephalic and atraumatic.  Eyes:     Pupils: Pupils are equal, round, and reactive to light.  Cardiovascular:     Rate and Rhythm: Normal rate and regular rhythm.     Heart sounds: Normal heart sounds.  Pulmonary:     Effort: Pulmonary effort is normal.     Breath sounds: Normal breath sounds.  Abdominal:     General: Bowel sounds are normal.     Palpations: Abdomen is soft.  Musculoskeletal:        General: No tenderness or deformity. Normal range of motion.     Cervical back: Normal range of motion.  Lymphadenopathy:     Cervical: No cervical adenopathy.  Skin:    General: Skin is warm and dry.     Findings: No erythema or rash.  Neurological:     Mental Status: He is alert and oriented to person, place, and time.  Psychiatric:        Behavior: Behavior normal.        Thought Content: Thought content normal.        Judgment: Judgment normal.     Lab Results  Component Value Date   WBC 9.2 08/02/2023   HGB 14.1 08/02/2023   HCT 45.0 08/02/2023   MCV  87.5 08/02/2023   PLT 309 08/02/2023   Lab Results  Component Value Date   FERRITIN 12 (L) 04/26/2023   IRON 69 04/26/2023   TIBC 410 04/26/2023   UIBC 341 04/26/2023   IRONPCTSAT 17 (L) 04/26/2023   Lab Results  Component Value Date   RETICCTPCT 2.0 10/25/2022  RBC 5.14 08/02/2023   RETICCTABS 77.3 06/20/2015   No results found for: "KPAFRELGTCHN", "LAMBDASER", "KAPLAMBRATIO" No results found for: "IGGSERUM", "IGA", "IGMSERUM" No results found for: "TOTALPROTELP", "ALBUMINELP", "A1GS", "A2GS", "BETS", "BETA2SER", "GAMS", "MSPIKE", "SPEI"   Chemistry      Component Value Date/Time   NA 139 04/26/2023 0933   NA 142 07/18/2017 1421   NA 134 (L) 08/09/2016 1313   K 4.8 04/26/2023 0933   K 4.0 07/18/2017 1421   K 4.2 08/09/2016 1313   CL 106 04/26/2023 0933   CL 103 07/18/2017 1421   CO2 27 04/26/2023 0933   CO2 29 07/18/2017 1421   CO2 20 (L) 08/09/2016 1313   BUN 19 04/26/2023 0933   BUN 19 07/18/2017 1421   BUN 19.9 08/09/2016 1313   CREATININE 1.28 (H) 04/26/2023 0933   CREATININE 1.4 (H) 07/18/2017 1421   CREATININE 1.3 08/09/2016 1313      Component Value Date/Time   CALCIUM 9.9 04/26/2023 0933   CALCIUM 10.2 07/18/2017 1421   CALCIUM 10.0 08/09/2016 1313   ALKPHOS 57 04/26/2023 0933   ALKPHOS 65 07/18/2017 1421   ALKPHOS 75 08/09/2016 1313   AST 9 (L) 04/26/2023 0933   AST 14 08/09/2016 1313   ALT 7 04/26/2023 0933   ALT 21 07/18/2017 1421   ALT 23 08/09/2016 1313   BILITOT 0.4 04/26/2023 0933   BILITOT 0.34 08/09/2016 1313      Impression and Plan: Mr. Desocio is a 83 year old male. He has secondary polycythemia.  I really do not think that he needs to be phlebotomized today.  I am very happy about this.  Have to watch this area in the right upper lung.  Again, PET scans really work for [place].  I will see about doing another PET scan on him probably sometime in about 3 months.  I think this is a good timeframe for [place].  As always, we will see him back  afterwards.     Josph Macho, MD 1/17/202510:03 AM

## 2023-10-14 ENCOUNTER — Encounter: Payer: Self-pay | Admitting: Hematology & Oncology

## 2023-10-21 ENCOUNTER — Encounter (HOSPITAL_COMMUNITY)
Admission: RE | Admit: 2023-10-21 | Discharge: 2023-10-21 | Disposition: A | Discharge status: Home / Self Care | Source: Ambulatory Visit | Attending: Hematology & Oncology | Admitting: Hematology & Oncology

## 2023-10-21 DIAGNOSIS — C3411 Malignant neoplasm of upper lobe, right bronchus or lung: Secondary | ICD-10-CM

## 2023-10-21 LAB — GLUCOSE, CAPILLARY: Glucose-Capillary: 151 mg/dL — ABNORMAL HIGH (ref 70–99)

## 2023-10-21 MED ORDER — FLUDEOXYGLUCOSE F - 18 (FDG) INJECTION
7.8000 | Freq: Once | INTRAVENOUS | Status: AC
  Administered 2023-10-21: 7.8 via INTRAVENOUS

## 2023-11-04 ENCOUNTER — Inpatient Hospital Stay

## 2023-11-04 ENCOUNTER — Inpatient Hospital Stay: Payer: Medicare Other | Attending: Hematology & Oncology

## 2023-11-04 ENCOUNTER — Other Ambulatory Visit: Payer: Self-pay

## 2023-11-04 ENCOUNTER — Encounter: Payer: Self-pay | Admitting: Hematology & Oncology

## 2023-11-04 ENCOUNTER — Inpatient Hospital Stay (HOSPITAL_BASED_OUTPATIENT_CLINIC_OR_DEPARTMENT_OTHER): Admitting: Hematology & Oncology

## 2023-11-04 VITALS — BP 126/59 | HR 68 | Temp 97.4°F | Resp 18 | Ht 65.0 in | Wt 158.0 lb

## 2023-11-04 DIAGNOSIS — C3411 Malignant neoplasm of upper lobe, right bronchus or lung: Secondary | ICD-10-CM

## 2023-11-04 DIAGNOSIS — D45 Polycythemia vera: Secondary | ICD-10-CM

## 2023-11-04 DIAGNOSIS — Z85118 Personal history of other malignant neoplasm of bronchus and lung: Secondary | ICD-10-CM | POA: Insufficient documentation

## 2023-11-04 LAB — CMP (CANCER CENTER ONLY)
ALT: 6 U/L (ref 0–44)
AST: 9 U/L — ABNORMAL LOW (ref 15–41)
Albumin: 4 g/dL (ref 3.5–5.0)
Alkaline Phosphatase: 57 U/L (ref 38–126)
Anion gap: 11 (ref 5–15)
BUN: 20 mg/dL (ref 8–23)
CO2: 22 mmol/L (ref 22–32)
Calcium: 9.5 mg/dL (ref 8.9–10.3)
Chloride: 107 mmol/L (ref 98–111)
Creatinine: 1.36 mg/dL — ABNORMAL HIGH (ref 0.61–1.24)
GFR, Estimated: 52 mL/min — ABNORMAL LOW (ref >60)
Glucose, Bld: 192 mg/dL — ABNORMAL HIGH (ref 70–99)
Potassium: 4.6 mmol/L (ref 3.5–5.1)
Sodium: 140 mmol/L (ref 135–145)
Total Bilirubin: 0.3 mg/dL (ref 0.0–1.2)
Total Protein: 6.2 g/dL — ABNORMAL LOW (ref 6.5–8.1)

## 2023-11-04 LAB — CBC WITH DIFFERENTIAL (CANCER CENTER ONLY)
Abs Immature Granulocytes: 0.1 10*3/uL — ABNORMAL HIGH (ref 0.00–0.07)
Basophils Absolute: 0.1 10*3/uL (ref 0.0–0.1)
Basophils Relative: 1 %
Eosinophils Absolute: 0.4 10*3/uL (ref 0.0–0.5)
Eosinophils Relative: 5 %
HCT: 44.5 % (ref 39.0–52.0)
Hemoglobin: 13.9 g/dL (ref 13.0–17.0)
Immature Granulocytes: 1 %
Lymphocytes Relative: 19 %
Lymphs Abs: 1.4 10*3/uL (ref 0.7–4.0)
MCH: 26.8 pg (ref 26.0–34.0)
MCHC: 31.2 g/dL (ref 30.0–36.0)
MCV: 85.9 fL (ref 80.0–100.0)
Monocytes Absolute: 0.6 10*3/uL (ref 0.1–1.0)
Monocytes Relative: 8 %
Neutro Abs: 5.1 10*3/uL (ref 1.7–7.7)
Neutrophils Relative %: 66 %
Platelet Count: 300 10*3/uL (ref 150–400)
RBC: 5.18 MIL/uL (ref 4.22–5.81)
RDW: 16.3 % — ABNORMAL HIGH (ref 11.5–15.5)
WBC Count: 7.6 10*3/uL (ref 4.0–10.5)
nRBC: 0 % (ref 0.0–0.2)

## 2023-11-04 LAB — FERRITIN: Ferritin: 7 ng/mL — ABNORMAL LOW (ref 24–336)

## 2023-11-04 LAB — IRON AND IRON BINDING CAPACITY (CC-WL,HP ONLY)
Iron: 40 ug/dL — ABNORMAL LOW (ref 45–182)
Saturation Ratios: 9 % — ABNORMAL LOW (ref 17.9–39.5)
TIBC: 431 ug/dL (ref 250–450)
UIBC: 391 ug/dL — ABNORMAL HIGH (ref 117–376)

## 2023-11-04 LAB — RETICULOCYTES
Immature Retic Fract: 29.1 % — ABNORMAL HIGH (ref 2.3–15.9)
RBC.: 5.15 MIL/uL (ref 4.22–5.81)
Retic Count, Absolute: 96.8 10*3/uL (ref 19.0–186.0)
Retic Ct Pct: 1.9 % (ref 0.4–3.1)

## 2023-11-04 NOTE — Progress Notes (Signed)
 Get her 1:00 Hematology and Oncology Follow Up Visit  Robert Tran [id number] Oct 12, 1940 83 y.o. 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  As always, he is doing quite nicely.  He and his wife just got back from the beach.  They are actually buying a house down there.  Not surprised.  They are very active.  I am sure that they will enjoy going down there and staying every now and then..  We did have a PET scan that was done.  The PET scan was done on 10/21/2023.  PET scan only showed mild increase in activity and a 1.2 cm right upper lobe nodule.  The SUV was 2.8.  Otherwise, everything looks fine.  He is on cholesterol injectable medicine.  I think this really is helping his hemoglobin.  He does not need to be phlebotomized today.  He has had no problems with fever.  He has had no cough.  He is not smoking.  He is trying to cut back on coffee.  Overall, I would say that his performance status probably ECOG 1.   Medications:  Allergies as of 11/04/2023       Reactions   Pravastatin Sodium Other (See Comments)   Patient stated,"it messed up my back and legs, I couldn't walk."   Minocycline Nausea Only, Nausea And Vomiting   Sulfa Antibiotics Nausea Only, Other (See Comments)   Dizziness, nausea   Sulfa Drugs Cross Reactors Nausea Only        Medication List        Accurate as of November 04, 2023 10:58 AM. If you have any questions, ask your nurse or doctor.          albuterol  108 (90 Base) MCG/ACT inhaler Commonly known as: VENTOLIN  HFA Inhale 2 puffs into the lungs every 6 (six) hours as needed for  wheezing or shortness of breath.   Alirocumab 150 MG/ML [place] Inject 150 mg into the skin.   amLODipine 10 MG tablet Commonly known as: NORVASC Take 10 mg by mouth daily.   aspirin EC 81 MG tablet Take 81 mg by mouth daily.   cefdinir  300 MG capsule Commonly known as: OMNICEF  Take 2 capsules (600 mg total) by mouth daily.   clopidogrel  75 MG tablet Commonly known as: Plavix  Take 1 tablet (75 mg total) by mouth daily. Okay to restart this medication on 02/20/2020.   empagliflozin 10 MG Tabs tablet Commonly known as: JARDIANCE Take 1 tablet by mouth daily.   fluocinonide 0.05 % external solution Commonly known as: LIDEX Apply 1 application topically every other day.   folic acid 400 MCG tablet Commonly known as: FOLVITE Take 400 mcg by mouth every morning.   hydrocortisone 2.5 % cream 1 application daily as needed.   LEQVIO [place] Inject into the skin. Receives injection in Dr. Clarita Cross office in [facility].   metFORMIN 500 MG 24 hr tablet Commonly known as: GLUCOPHAGE-XR Take 1,000 mg by mouth at bedtime.   methylPREDNISolone  4 MG Tbpk tablet Commonly known as: MEDROL  DOSEPAK Take as directed.   metoprolol succinate 25 MG 24 hr tablet Commonly known as: TOPROL-XL Take 25  mg by mouth daily.   pantoprazole  40 MG tablet Commonly known as: Protonix  Take 1 tablet (40 mg total) by mouth 2 (two) times daily.   ramipril 10 MG tablet Commonly known as: ALTACE Take 10 mg by mouth daily.   tamsulosin 0.4 MG Caps capsule Commonly known as: FLOMAX Take 0.4 mg by mouth daily as needed (kidney stones).   tiotropium 18 MCG inhalation capsule Commonly known as: SPIRIVA Place 18 mcg into inhaler and inhale daily. Only  uses prn.   Vitamin D 125 MCG (5000 UT) Caps Take 5,000 Units by mouth daily.        Allergies:  Allergies  Allergen Reactions   Pravastatin Sodium Other (See Comments)    Patient stated,"it messed up my back and legs, I couldn't walk."   Minocycline  Nausea Only and Nausea And Vomiting   Sulfa Antibiotics Nausea Only and Other (See Comments)    Dizziness, nausea   Sulfa Drugs Cross Reactors Nausea Only    Past Medical History, Surgical history, Social history, and Family History were reviewed and updated.  Review of Systems: Review of Systems  Constitutional: Negative.   HENT: Negative.    Eyes: Negative.   Respiratory: Negative.    Cardiovascular: Negative.   Gastrointestinal: Negative.   Genitourinary: Negative.   Musculoskeletal: Negative.   Skin: Negative.   Neurological: Negative.   Endo/Heme/Allergies: Negative.   Psychiatric/Behavioral: Negative.      Physical Exam:  height is 5\' 5"  (1.651 m) and weight is 158 lb (71.7 kg). His temperature is 97.4 F (36.3 C) (abnormal). His blood pressure is 126/59 (abnormal) and his pulse is 68. His respiration is 18.   Wt Readings from Last 3 Encounters:  11/04/23 158 lb (71.7 kg)  08/02/23 157 lb (71.2 kg)  04/26/23 155 lb (70.3 kg)    Physical Exam Vitals reviewed.  HENT:     Head: Normocephalic and atraumatic.  Eyes:     Pupils: Pupils are equal, round, and reactive to light.  Cardiovascular:     Rate and Rhythm: Normal rate and regular rhythm.     Heart sounds: Normal heart sounds.  Pulmonary:     Effort: Pulmonary effort is normal.     Breath sounds: Normal breath sounds.  Abdominal:     General: Bowel sounds are normal.     Palpations: Abdomen is soft.  Musculoskeletal:        General: No tenderness or deformity. Normal range of motion.     Cervical back: Normal range of motion.  Lymphadenopathy:     Cervical: No cervical adenopathy.  Skin:    General: Skin is warm and dry.     Findings: No erythema or rash.  Neurological:     Mental Status: He is alert and oriented to person, place, and time.  Psychiatric:        Behavior: Behavior normal.        Thought Content: Thought content normal.        Judgment: Judgment normal.     Lab Results  Component  Value Date   WBC 7.6 11/04/2023   HGB 13.9 11/04/2023   HCT 44.5 11/04/2023   MCV 85.9 11/04/2023   PLT 300 11/04/2023   Lab Results  Component Value Date   FERRITIN 12 (L) 04/26/2023   IRON 69 04/26/2023   TIBC 410 04/26/2023   UIBC 341 04/26/2023   IRONPCTSAT 17 (L) 04/26/2023   Lab Results  Component Value Date   RETICCTPCT 1.9 11/04/2023  RBC 5.18 11/04/2023   RBC 5.15 11/04/2023   RETICCTABS 77.3 06/20/2015   No results found for: "KPAFRELGTCHN", "LAMBDASER", "KAPLAMBRATIO" No results found for: "IGGSERUM", "IGA", "IGMSERUM" No results found for: "TOTALPROTELP", "ALBUMINELP", "A1GS", "A2GS", "BETS", "BETA2SER", "GAMS", "MSPIKE", "SPEI"   Chemistry      Component Value Date/Time   NA 140 11/04/2023 0931   NA 142 07/18/2017 1421   NA 134 (L) 08/09/2016 1313   K 4.6 11/04/2023 0931   K 4.0 07/18/2017 1421   K 4.2 08/09/2016 1313   CL 107 11/04/2023 0931   CL 103 07/18/2017 1421   CO2 22 11/04/2023 0931   CO2 29 07/18/2017 1421   CO2 20 (L) 08/09/2016 1313   BUN 20 11/04/2023 0931   BUN 19 07/18/2017 1421   BUN 19.9 08/09/2016 1313   CREATININE 1.36 (H) 11/04/2023 0931   CREATININE 1.4 (H) 07/18/2017 1421   CREATININE 1.3 08/09/2016 1313      Component Value Date/Time   CALCIUM 9.5 11/04/2023 0931   CALCIUM 10.2 07/18/2017 1421   CALCIUM 10.0 08/09/2016 1313   ALKPHOS 57 11/04/2023 0931   ALKPHOS 65 07/18/2017 1421   ALKPHOS 75 08/09/2016 1313   AST 9 (L) 11/04/2023 0931   AST 14 08/09/2016 1313   ALT 6 11/04/2023 0931   ALT 21 07/18/2017 1421   ALT 23 08/09/2016 1313   BILITOT 0.3 11/04/2023 0931   BILITOT 0.34 08/09/2016 1313      Impression and Plan: Mr. Ledin is a 83 year old male. He has secondary polycythemia.  I really do not think that he needs to be phlebotomized today.  I am very happy about this.  Again, we will have to watch this right upper lobe nodule.  Again is very low SUV.  I think with the next PET scan, if the SUV is higher, then we  may have to think about a SBRT.  I am [place]  I know that they will enjoy it.  They we will go down there on occasion.  They do like to travel.  We will do another PET scan on him in about 3 months.  I will see him back afterwards.  Ivor Mars, MD 4/21/202510:58 AM

## 2024-01-23 ENCOUNTER — Encounter (HOSPITAL_COMMUNITY)
Admission: RE | Admit: 2024-01-23 | Discharge: 2024-01-23 | Disposition: A | Discharge status: Home / Self Care | Source: Ambulatory Visit | Attending: Hematology & Oncology | Admitting: Hematology & Oncology

## 2024-01-23 DIAGNOSIS — C3411 Malignant neoplasm of upper lobe, right bronchus or lung: Secondary | ICD-10-CM | POA: Diagnosis present

## 2024-01-23 LAB — GLUCOSE, CAPILLARY: Glucose-Capillary: 135 mg/dL — ABNORMAL HIGH (ref 70–99)

## 2024-01-23 MED ORDER — FLUDEOXYGLUCOSE F - 18 (FDG) INJECTION
8.0000 | Freq: Once | INTRAVENOUS | Status: AC | PRN
  Administered 2024-01-23: 8 via INTRAVENOUS

## 2024-01-29 NOTE — Progress Notes (Signed)
 Radiation Oncology         [phone]) [phone] ________________________________  Initial Outpatient Re-Consultation  Name: Robert Tran MRN: [id number]  Date: 01/30/2024  DOB: Jan 05, 1941  RR:Tybuz, Debby HERO, MD  Timmy Maude SAUNDERS, MD   REFERRING PHYSICIAN: Timmy Maude SAUNDERS, MD  DIAGNOSIS: There were no encounter diagnoses.  The encounter diagnosis was Primary cancer of right upper lobe of lung (HCC).   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 male who is accompanied by ***. he is seen as a courtesy of Dr. Timmy for an opinion concerning radiation therapy as part of management for his recurrent lung cancer. Patient was last seen in office on 08/24/21 for a follow up visit.    Patient continued to be followed by Dr. Timmy, with most recent visit on 11/04/23. At that time they discussed his recent PET scan that was done on 10/21/2023. PET scan only showed mild increase in activity and a 1.2 cm right upper lobe nodule. The SUV was 2.8. at that time, Dr. Timmy, recommended to continue monitoring the nodule.   Most recent PET scan performed on 01/23/24 showed subsolid 1.4 by 0.9 cm nodule in the right upper lobe has maximum SUV of 3.5, previously 2.8. This level of activity raises concern for low-grade adenocarcinoma.   Due to the increase is SUV, Dr. Timmy, recommends undergoing SBRT treatment.  PREVIOUS RADIATION THERAPY: Yes  Radiation Treatment Dates: 07/12/2021 through 07/19/2021 Site Technique Total Dose (Gy) Dose per Fx (Gy) Completed Fx Beam Energies  Lung, Left: Lung_L IMRT 54/54 18 3/3 6XFFF    1)  04/26/2020 - 05/06/2020 (60 Gy delivered in 5 fractions of 12 Gy each; SBRT/SRT-IMRT directed at the right lung) PAST MEDICAL HISTORY:  Past Medical History:  Diagnosis Date   Chronic bronchitis (HCC)    Chronic kidney disease    stage 3   Coronary artery  disease    Diabetes mellitus without complication (HCC)    type 2   Diverticulosis of intestine with bleeding 07/19/2017   Diverticulosis of large intestine with hemorrhage 07/19/2017   Dyspnea    History of kidney stones    passed stones and lithotripsy   History of radiation therapy    right lung - 04/26/2020-05/06/2020  Dr Lynwood Nasuti   History of radiation therapy    left lung - 07/12/2021-07/19/2021  Dr Lynwood Nasuti   HLD (hyperlipidemia)    Hypertension    Iron deficiency anemia 07/19/2017   Melanoma (HCC)    on back    PAST SURGICAL HISTORY: Past Surgical History:  Procedure Laterality Date   BRONCHIAL BIOPSY  02/18/2020   Procedure: BRONCHIAL BIOPSIES;  Surgeon: Shelah Lamar RAMAN, MD;  Location: [facility] ENDOSCOPY;  Service: Pulmonary;;   BRONCHIAL BRUSHINGS  02/18/2020   Procedure: BRONCHIAL BRUSHINGS;  Surgeon: Shelah Lamar RAMAN, MD;  Location: [facility] ENDOSCOPY;  Service: Pulmonary;;   BRONCHIAL NEEDLE ASPIRATION BIOPSY  02/18/2020   Procedure: BRONCHIAL NEEDLE ASPIRATION BIOPSIES;  Surgeon: Shelah Lamar RAMAN, MD;  Location: [facility] ENDOSCOPY;  Service: Pulmonary;;   BRONCHIAL WASHINGS  02/18/2020   Procedure: BRONCHIAL WASHINGS;  Surgeon: Shelah Lamar RAMAN, MD;  Location: [facility] ENDOSCOPY;  Service: Pulmonary;;   COLONOSCOPY  2019   EYE SURGERY     bilateral cateracts   LITHOTRIPSY     SKIN CANCER EXCISION     on back   VIDEO BRONCHOSCOPY WITH  ENDOBRONCHIAL NAVIGATION N/A 02/18/2020   Procedure: VIDEO BRONCHOSCOPY WITH ENDOBRONCHIAL NAVIGATION;  Surgeon: Shelah Lamar RAMAN, MD;  Location: [facility] ENDOSCOPY;  Service: Pulmonary;  Laterality: N/A;    FAMILY HISTORY: No family history on file.  SOCIAL HISTORY:  Social History   Tobacco Use   Smoking status: Every Day    Current packs/day: 1.50    Average packs/day: 1.5 packs/day for 82.6 years (123.8 ttl pk-yrs)    Types: Cigarettes    Start date: [birth year]06/1941   Smokeless tobacco: Never   Tobacco comments:    2-3 cigarettes smoked daily 04/06/20 [facility]    Vaping Use   Vaping status: Never Used  Substance Use Topics   Alcohol use: Yes    Alcohol/week: 2.0 standard drinks of alcohol    Types: 2 Shots of liquor per week    Comment: 1/5 once a month    Drug use: Never    ALLERGIES:  Allergies  Allergen Reactions   Pravastatin Sodium Other (See Comments)    Patient stated,it messed up my back and legs, I couldn't walk.   Minocycline Nausea Only and Nausea And Vomiting   Sulfa Antibiotics Nausea Only and Other (See Comments)    Dizziness, nausea   Sulfa Drugs Cross Reactors Nausea Only    MEDICATIONS:  Current Outpatient Medications  Medication Sig Dispense Refill   albuterol  (VENTOLIN  HFA) 108 (90 Base) MCG/ACT inhaler Inhale 2 puffs into the lungs every 6 (six) hours as needed for wheezing or shortness of breath. (Patient not taking: Reported on 01/24/2023) 18 g 5   Alirocumab 150 MG/ML SOAJ Inject 150 mg into the skin.     amLODipine (NORVASC) 10 MG tablet Take 10 mg by mouth daily.     aspirin EC 81 MG tablet Take 81 mg by mouth daily.     cefdinir  (OMNICEF ) 300 MG capsule Take 2 capsules (600 mg total) by mouth daily. 14 capsule 0   Cholecalciferol (VITAMIN D) 125 MCG (5000 UT) CAPS Take 5,000 Units by mouth daily.      clopidogrel  (PLAVIX ) 75 MG tablet Take 1 tablet (75 mg total) by mouth daily. Okay to restart this medication on 02/20/2020.     empagliflozin (JARDIANCE) 10 MG TABS tablet Take 1 tablet by mouth daily.     fluocinonide (LIDEX) 0.05 % external solution Apply 1 application topically every other day.      folic acid (FOLVITE) 400 MCG tablet Take 400 mcg by mouth every morning.      hydrocortisone 2.5 % cream 1 application daily as needed.     Inclisiran Sodium (LEQVIO [place]) Inject into the skin. Receives injection in Dr. Burke office in [facility].     metFORMIN (GLUCOPHAGE-XR) 500 MG 24 hr tablet Take 1,000 mg by mouth at bedtime.      methylPREDNISolone  (MEDROL  DOSEPAK) 4 MG TBPK tablet Take as directed. 21 tablet  0   metoprolol succinate (TOPROL-XL) 25 MG 24 hr tablet Take 25 mg by mouth daily.     pantoprazole  (PROTONIX ) 40 MG tablet Take 1 tablet (40 mg total) by mouth 2 (two) times daily. 60 tablet 6   ramipril (ALTACE) 10 MG tablet Take 10 mg by mouth daily.     tamsulosin (FLOMAX) 0.4 MG CAPS capsule Take 0.4 mg by mouth daily as needed (kidney stones).     tiotropium (SPIRIVA) 18 MCG inhalation capsule Place 18 mcg into inhaler and inhale daily. Only  uses prn.     No current facility-administered medications for this  encounter.    REVIEW OF SYSTEMS:  A 10+ POINT REVIEW OF SYSTEMS WAS OBTAINED including neurology, dermatology, psychiatry, cardiac, respiratory, lymph, extremities, GI, GU, musculoskeletal, constitutional, reproductive, HEENT. ***   PHYSICAL EXAM:  vitals were not taken for this visit.   General: Alert and oriented, in no acute distress HEENT: Head is normocephalic. Extraocular movements are intact. Oropharynx is clear. Neck: Neck is supple, no palpable cervical or supraclavicular lymphadenopathy. Heart: Regular in rate and rhythm with no murmurs, rubs, or gallops. Chest: Clear to auscultation bilaterally, with no rhonchi, wheezes, or rales. Abdomen: Soft, nontender, nondistended, with no rigidity or guarding. Extremities: No cyanosis or edema. Lymphatics: see Neck Exam Skin: No concerning lesions. Musculoskeletal: symmetric strength and muscle tone throughout. Neurologic: Cranial nerves II through XII are grossly intact. No obvious focalities. Speech is fluent. Coordination is intact. Psychiatric: Judgment and insight are intact. Affect is appropriate. ***  ECOG = ***  0 - Asymptomatic (Fully active, able to carry on all predisease activities without restriction)  1 - Symptomatic but completely ambulatory (Restricted in physically strenuous activity but ambulatory and able to carry out work of a light or sedentary nature. For example, light housework, office work)  2 -  Symptomatic, <50% in bed during the day (Ambulatory and capable of all self care but unable to carry out any work activities. Up and about more than 50% of waking hours)  3 - Symptomatic, >50% in bed, but not bedbound (Capable of only limited self-care, confined to bed or chair 50% or more of waking hours)  4 - Bedbound (Completely disabled. Cannot carry on any self-care. Totally confined to bed or chair)  5 - Death   Raylene MM, Creech RH, Tormey DC, et al. [phone]). Toxicity and response criteria of the [facility]. Am. DOROTHA Bridges. Oncol. 5 (6): 649-55  LABORATORY DATA:  Lab Results  Component Value Date   WBC 7.6 11/04/2023   HGB 13.9 11/04/2023   HCT 44.5 11/04/2023   MCV 85.9 11/04/2023   PLT 300 11/04/2023   NEUTROABS 5.1 11/04/2023   Lab Results  Component Value Date   NA 140 11/04/2023   K 4.6 11/04/2023   CL 107 11/04/2023   CO2 22 11/04/2023   GLUCOSE 192 (H) 11/04/2023   BUN 20 11/04/2023   CREATININE 1.36 (H) 11/04/2023   CALCIUM 9.5 11/04/2023      RADIOGRAPHY: NM PET Image Restag (PS) Skull Base To Thigh Result Date: 01/25/2024 CLINICAL DATA:  Subsequent treatment strategy for non-small cell lung cancer. EXAM: NUCLEAR MEDICINE PET SKULL BASE TO THIGH TECHNIQUE: 7.8 mCi F-18 FDG was injected intravenously. Full-ring PET imaging was performed from the skull base to thigh after the radiotracer. CT data was obtained and used for attenuation correction and anatomic localization. Fasting blood glucose: 135 mg/dl COMPARISON:  11/14/7972 FINDINGS: Mediastinal blood pool activity: SUV max 2.3 Liver activity: SUV max NA NECK: Small focus of pre-auricular/upper parotid activity on the left without well-defined CT correlate, maximum SUV 0.4, previously 3.6 in this region. Significance uncertain. Incidental CT findings: Bilateral common carotid atheromatous vascular calcification. CHEST: Subsolid 1.4 by 0.9 cm nodule in the right upper lobe on image 62 series 4 has  maximum SUV of 3.5 and formerly 2.8. This level of activity raises concern for low-grade adenocarcinoma. Therapy related findings in the right mid lung with low-grade associated activity. No hypermetabolic adenopathy. Incidental CT findings: Mild scarring or inflammation in the lingula and adjacent anterior left lower lobe without substantial hypermetabolic activity.  Coronary, aortic arch, and branch vessel atherosclerotic vascular disease. ABDOMEN/PELVIS: Substantial multi segmental activity in bowel without definite CT correlate, compatible with physiologic activity. No adrenal mass. Photopenic benign left kidney pole cyst warranting no further imaging. Incidental CT findings: Atherosclerosis is present, including aortoiliac atherosclerotic disease. Nonobstructive right nephrolithiasis. Sigmoid colon diverticulosis. Small umbilical hernia contains adipose tissue. Prostatomegaly. SKELETON: No significant abnormal hypermetabolic activity in this region. Incidental CT findings: None. IMPRESSION: 1. Subsolid 1.4 by 0.9 cm nodule in the right upper lobe has maximum SUV of 3.5 and formerly 2.8. This level of activity raises concern for low-grade adenocarcinoma. 2. No findings of metastatic disease. 3. Small focus of pre-auricular/upper parotid activity on the left without well-defined CT correlate, maximum SUV 0.4, previously 3.6 in this region. Significance uncertain. 4. Nonobstructive right nephrolithiasis. 5. Sigmoid colon diverticulosis. 6. Prostatomegaly. 7. Small umbilical hernia contains adipose tissue. 8. Aortic Atherosclerosis (ICD10-I70.0). Coronary and systemic atherosclerosis. Electronically Signed   By: Ryan Salvage M.D.   On: 01/25/2024 13:32      IMPRESSION: The encounter diagnosis was Primary cancer of right upper lobe of lung (HCC).  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  We discussed the natural history of *** and general treatment, highlighting the role of radiotherapy in the management.  We discussed the available radiation techniques, and focused on the details of logistics and delivery.  We reviewed the anticipated acute and late sequelae associated with radiation in this setting.  The patient was encouraged to ask questions that I answered to the best of my ability. *** A patient consent form was discussed and signed.  We retained a copy for our records.  The patient would like to proceed with radiation and will be scheduled for CT simulation.  PLAN: ***    *** minutes of total time was spent for this patient encounter, including preparation, face-to-face counseling with the patient and coordination of care, physical exam, and documentation of the encounter.   ------------------------------------------------  Lynwood CHARM Nasuti, PhD, MD  This document serves as a record of services personally performed by Lynwood Nasuti, MD. It was created on his behalf by Reymundo Cartwright, a trained medical scribe. The creation of this record is based on the scribe's personal observations and the provider's statements to them. This document has been checked and approved by the attending provider.

## 2024-01-29 NOTE — Progress Notes (Incomplete)
 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 Yes  Left Lung 07/12/2021 through 07/19/2021         RUL lung 04/26/2020-05/06/2020  Pacemaker/ICD? {:18581} Possible current pregnancy? no Is the patient on methotrexate? {:18581}  Current Complaints / other details:  ***     ***

## 2024-01-30 ENCOUNTER — Ambulatory Visit
Admission: RE | Admit: 2024-01-30 | Discharge: 2024-01-30 | Disposition: A | Discharge status: Home / Self Care | Source: Ambulatory Visit | Attending: Radiation Oncology | Admitting: Radiation Oncology

## 2024-01-30 ENCOUNTER — Encounter: Payer: Self-pay | Admitting: Hematology & Oncology

## 2024-01-30 ENCOUNTER — Encounter: Payer: Self-pay | Admitting: Radiation Oncology

## 2024-01-30 VITALS — BP 137/65 | HR 77 | Temp 97.3°F | Resp 18 | Ht 65.0 in | Wt 152.2 lb

## 2024-01-30 DIAGNOSIS — R911 Solitary pulmonary nodule: Secondary | ICD-10-CM | POA: Insufficient documentation

## 2024-01-30 DIAGNOSIS — C3491 Malignant neoplasm of unspecified part of right bronchus or lung: Secondary | ICD-10-CM

## 2024-02-03 ENCOUNTER — Inpatient Hospital Stay (HOSPITAL_BASED_OUTPATIENT_CLINIC_OR_DEPARTMENT_OTHER): Admitting: Hematology & Oncology

## 2024-02-03 ENCOUNTER — Encounter

## 2024-02-03 ENCOUNTER — Encounter: Payer: Self-pay | Admitting: Hematology & Oncology

## 2024-02-03 ENCOUNTER — Inpatient Hospital Stay: Attending: Hematology & Oncology

## 2024-02-03 ENCOUNTER — Other Ambulatory Visit: Payer: Self-pay

## 2024-02-03 VITALS — BP 138/67 | HR 75 | Temp 97.5°F | Resp 20 | Ht 65.0 in | Wt 151.4 lb

## 2024-02-03 DIAGNOSIS — Z85118 Personal history of other malignant neoplasm of bronchus and lung: Secondary | ICD-10-CM | POA: Insufficient documentation

## 2024-02-03 DIAGNOSIS — C3411 Malignant neoplasm of upper lobe, right bronchus or lung: Secondary | ICD-10-CM

## 2024-02-03 DIAGNOSIS — D45 Polycythemia vera: Secondary | ICD-10-CM

## 2024-02-03 DIAGNOSIS — Z08 Encounter for follow-up examination after completed treatment for malignant neoplasm: Secondary | ICD-10-CM | POA: Diagnosis not present

## 2024-02-03 DIAGNOSIS — F17210 Nicotine dependence, cigarettes, uncomplicated: Secondary | ICD-10-CM | POA: Insufficient documentation

## 2024-02-03 LAB — CMP (CANCER CENTER ONLY)
ALT: 13 U/L (ref 0–44)
AST: 10 U/L — ABNORMAL LOW (ref 15–41)
Albumin: 4.4 g/dL (ref 3.5–5.0)
Alkaline Phosphatase: 60 U/L (ref 38–126)
Anion gap: 8 (ref 5–15)
BUN: 42 mg/dL — ABNORMAL HIGH (ref 8–23)
CO2: 22 mmol/L (ref 22–32)
Calcium: 11.2 mg/dL — ABNORMAL HIGH (ref 8.9–10.3)
Chloride: 107 mmol/L (ref 98–111)
Creatinine: 1.21 mg/dL (ref 0.61–1.24)
GFR, Estimated: 59 mL/min — ABNORMAL LOW (ref >60)
Glucose, Bld: 146 mg/dL — ABNORMAL HIGH (ref 70–99)
Potassium: 4.9 mmol/L (ref 3.5–5.1)
Sodium: 137 mmol/L (ref 135–145)
Total Bilirubin: 0.4 mg/dL (ref 0.0–1.2)
Total Protein: 6.8 g/dL (ref 6.5–8.1)

## 2024-02-03 LAB — CBC WITH DIFFERENTIAL (CANCER CENTER ONLY)
Abs Immature Granulocytes: 0.13 K/uL — ABNORMAL HIGH (ref 0.00–0.07)
Basophils Absolute: 0.1 K/uL (ref 0.0–0.1)
Basophils Relative: 1 %
Eosinophils Absolute: 0.4 K/uL (ref 0.0–0.5)
Eosinophils Relative: 4 %
HCT: 47.4 % (ref 39.0–52.0)
Hemoglobin: 14.9 g/dL (ref 13.0–17.0)
Immature Granulocytes: 1 %
Lymphocytes Relative: 18 %
Lymphs Abs: 1.8 K/uL (ref 0.7–4.0)
MCH: 27.1 pg (ref 26.0–34.0)
MCHC: 31.4 g/dL (ref 30.0–36.0)
MCV: 86.2 fL (ref 80.0–100.0)
Monocytes Absolute: 0.7 K/uL (ref 0.1–1.0)
Monocytes Relative: 7 %
Neutro Abs: 7 K/uL (ref 1.7–7.7)
Neutrophils Relative %: 69 %
Platelet Count: 305 K/uL (ref 150–400)
RBC: 5.5 MIL/uL (ref 4.22–5.81)
RDW: 18.1 % — ABNORMAL HIGH (ref 11.5–15.5)
WBC Count: 10 K/uL (ref 4.0–10.5)
nRBC: 0 % (ref 0.0–0.2)

## 2024-02-03 LAB — IRON AND IRON BINDING CAPACITY (CC-WL,HP ONLY)
Iron: 51 ug/dL (ref 45–182)
Saturation Ratios: 12 % — ABNORMAL LOW (ref 17.9–39.5)
TIBC: 433 ug/dL (ref 250–450)
UIBC: 382 ug/dL — ABNORMAL HIGH (ref 117–376)

## 2024-02-03 LAB — RETICULOCYTES
Immature Retic Fract: 27.5 % — ABNORMAL HIGH (ref 2.3–15.9)
RBC.: 5.51 MIL/uL (ref 4.22–5.81)
Retic Count, Absolute: 118.5 K/uL (ref 19.0–186.0)
Retic Ct Pct: 2.2 % (ref 0.4–3.1)

## 2024-02-03 LAB — FERRITIN: Ferritin: 31 ng/mL (ref 24–336)

## 2024-02-03 NOTE — Progress Notes (Signed)
 Get her 1:00 Hematology and Oncology Follow Up Visit  Robert Tran [id number] Aug 19, 1940 83 y.o. 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  He is doing very well.  We did do a PET scan on him.  This was done on 01/23/2024.  PET scan showed that there was a nodule in the right upper lobe with a slightly higher SUV.  It measured 1.4 x 0.9 cm.  He is going to to have SBRT on this.  I think he will start next week.  Otherwise, he is doing okay.  He is lost a little bit of weight.  He is on a protein shake now to try to gain some weight.  He has had no nausea or vomiting.  He has had no cough or shortness of breath.  He has had no change in bowel or bladder habits.  Robert Tran still cut back on his cigarette use.  He is now on an injectable anticholesterol medicine once every 6 months.  He has been having some pain in his hips.  He thinks this might be from the cholesterol medication..  He has had no leg swelling.  He has had no headache.  He has had no rashes.  His last iron studies that were done showed a ferritin of 7 with an iron saturation of 9%.  Overall, I would say his performance status is probably ECOG 1.    Medications:  Allergies as of 02/03/2024       Reactions   Pravastatin Sodium Other (See Comments)   Patient stated,it messed up my back and legs, I couldn't walk.   Minocycline Nausea Only, Nausea And Vomiting   Sulfa Antibiotics Nausea Only, Other (See Comments)   Dizziness, nausea   Sulfa Drugs Cross Reactors Nausea Only        Medication List        Accurate as of February 03, 2024 10:28 AM. If  you have any questions, ask your nurse or doctor.          albuterol  108 (90 Base) MCG/ACT inhaler Commonly known as: VENTOLIN  HFA Inhale 2 puffs into the lungs every 6 (six) hours as needed for wheezing or shortness of breath.   Alirocumab 150 MG/ML Soaj Inject 150 mg into the skin. What changed: additional instructions   amLODipine 10 MG tablet Commonly known as: NORVASC Take 10 mg by mouth daily.   aspirin EC 81 MG tablet Take 81 mg by mouth daily.   cefdinir  300 MG capsule Commonly known as: OMNICEF  Take 2 capsules (600 mg total) by mouth daily.   clopidogrel  75 MG tablet Commonly known as: Plavix  Take 1 tablet (75 mg total) by mouth daily. Okay to restart this medication on 02/20/2020.   empagliflozin 10 MG Tabs tablet Commonly known as: JARDIANCE Take 1 tablet by mouth daily.   fluocinonide 0.05 % external solution Commonly known as: LIDEX Apply 1 application topically every other day.   folic acid 400 MCG tablet Commonly known as: FOLVITE Take 400 mcg by mouth every morning.   glucose blood test strip 1 each by Other route.   hydrocortisone 2.5 % cream 1 application daily  as needed.   LEQVIO [place] Inject into the skin. Receives injection in Dr. Burke office in [facility].   metFORMIN 500 MG 24 hr tablet Commonly known as: GLUCOPHAGE-XR Take 1,000 mg by mouth at bedtime.   methylPREDNISolone  4 MG Tbpk tablet Commonly known as: MEDROL  DOSEPAK Take as directed.   metoprolol succinate 25 MG 24 hr tablet Commonly known as: TOPROL-XL Take 25 mg by mouth daily.   pantoprazole  40 MG tablet Commonly known as: Protonix  Take 1 tablet (40 mg total) by mouth 2 (two) times daily. What changed: when to take this   ramipril 10 MG tablet Commonly known as: ALTACE Take 10 mg by mouth daily.   tamsulosin 0.4 MG Caps capsule Commonly known as: FLOMAX Take 0.4 mg by mouth daily as needed (kidney stones).   tiotropium 18 MCG inhalation capsule Commonly known  as: SPIRIVA Place 18 mcg into inhaler and inhale daily. Only  uses prn.   Vitamin D 125 MCG (5000 UT) Caps Take 5,000 Units by mouth daily.        Allergies:  Allergies  Allergen Reactions   Pravastatin Sodium Other (See Comments)    Patient stated,it messed up my back and legs, I couldn't walk.   Minocycline Nausea Only and Nausea And Vomiting   Sulfa Antibiotics Nausea Only and Other (See Comments)    Dizziness, nausea   Sulfa Drugs Cross Reactors Nausea Only    Past Medical History, Surgical history, Social history, and Family History were reviewed and updated.  Review of Systems: Review of Systems  Constitutional: Negative.   HENT: Negative.    Eyes: Negative.   Respiratory: Negative.    Cardiovascular: Negative.   Gastrointestinal: Negative.   Genitourinary: Negative.   Musculoskeletal: Negative.   Skin: Negative.   Neurological: Negative.   Endo/Heme/Allergies: Negative.   Psychiatric/Behavioral: Negative.      Physical Exam:  height is 5' 5 (1.651 m) and weight is 151 lb 6.4 oz (68.7 kg). His oral temperature is 97.5 F (36.4 C) (abnormal). His blood pressure is 138/67 and his pulse is 75. His respiration is 20 and oxygen saturation is 97%.   Wt Readings from Last 3 Encounters:  02/03/24 151 lb 6.4 oz (68.7 kg)  01/30/24 152 lb 4 oz (69.1 kg)  11/04/23 158 lb (71.7 kg)    Physical Exam Vitals reviewed.  HENT:     Head: Normocephalic and atraumatic.  Eyes:     Pupils: Pupils are equal, round, and reactive to light.  Cardiovascular:     Rate and Rhythm: Normal rate and regular rhythm.     Heart sounds: Normal heart sounds.  Pulmonary:     Effort: Pulmonary effort is normal.     Breath sounds: Normal breath sounds.  Abdominal:     General: Bowel sounds are normal.     Palpations: Abdomen is soft.  Musculoskeletal:        General: No tenderness or deformity. Normal range of motion.     Cervical back: Normal range of motion.  Lymphadenopathy:      Cervical: No cervical adenopathy.  Skin:    General: Skin is warm and dry.     Findings: No erythema or rash.  Neurological:     Mental Status: He is alert and oriented to person, place, and time.  Psychiatric:        Behavior: Behavior normal.        Thought Content: Thought content normal.        Judgment: Judgment  normal.      Lab Results  Component Value Date   WBC 10.0 02/03/2024   HGB 14.9 02/03/2024   HCT 47.4 02/03/2024   MCV 86.2 02/03/2024   PLT 305 02/03/2024   Lab Results  Component Value Date   FERRITIN 7 (L) 11/04/2023   IRON 40 (L) 11/04/2023   TIBC 431 11/04/2023   UIBC 391 (H) 11/04/2023   IRONPCTSAT 9 (L) 11/04/2023   Lab Results  Component Value Date   RETICCTPCT 2.2 02/03/2024   RBC 5.51 02/03/2024   RETICCTABS 77.3 06/20/2015   No results found for: KPAFRELGTCHN, LAMBDASER, KAPLAMBRATIO No results found for: IGGSERUM, IGA, IGMSERUM No results found for: STEPHANY CARLOTA BENSON MARKEL EARLA JOANNIE DOC VICK, SPEI   Chemistry      Component Value Date/Time   NA 137 02/03/2024 0914   NA 142 07/18/2017 1421   NA 134 (L) 08/09/2016 1313   K 4.9 02/03/2024 0914   K 4.0 07/18/2017 1421   K 4.2 08/09/2016 1313   CL 107 02/03/2024 0914   CL 103 07/18/2017 1421   CO2 22 02/03/2024 0914   CO2 29 07/18/2017 1421   CO2 20 (L) 08/09/2016 1313   BUN 42 (H) 02/03/2024 0914   BUN 19 07/18/2017 1421   BUN 19.9 08/09/2016 1313   CREATININE 1.21 02/03/2024 0914   CREATININE 1.4 (H) 07/18/2017 1421   CREATININE 1.3 08/09/2016 1313      Component Value Date/Time   CALCIUM 11.2 (H) 02/03/2024 0914   CALCIUM 10.2 07/18/2017 1421   CALCIUM 10.0 08/09/2016 1313   ALKPHOS 60 02/03/2024 0914   ALKPHOS 65 07/18/2017 1421   ALKPHOS 75 08/09/2016 1313   AST 10 (L) 02/03/2024 0914   AST 14 08/09/2016 1313   ALT 13 02/03/2024 0914   ALT 21 07/18/2017 1421   ALT 23 08/09/2016 1313   BILITOT 0.4 02/03/2024 0914    BILITOT 0.34 08/09/2016 1313      Impression and Plan: Mr. Kray is a 83 year old male. He has secondary polycythemia.  I really do not think that he needs to be phlebotomized today.  I am very happy about this.  Again, we will do the SBRT for his right upper lobe nodule.  I think this is reasonable.  I would like to get him back in September.  We probably will not need to do another PET scan until October or November.  I am happy that he is cut back on his cigarette use.  Maude JONELLE Crease, MD 7/21/202510:28 AM

## 2024-02-05 ENCOUNTER — Ambulatory Visit
Admission: RE | Admit: 2024-02-05 | Discharge: 2024-02-05 | Disposition: A | Discharge status: Home / Self Care | Source: Ambulatory Visit | Attending: Radiation Oncology | Admitting: Radiation Oncology

## 2024-02-05 DIAGNOSIS — C3411 Malignant neoplasm of upper lobe, right bronchus or lung: Secondary | ICD-10-CM | POA: Insufficient documentation

## 2024-02-05 DIAGNOSIS — C3491 Malignant neoplasm of unspecified part of right bronchus or lung: Secondary | ICD-10-CM

## 2024-02-13 DIAGNOSIS — C3411 Malignant neoplasm of upper lobe, right bronchus or lung: Secondary | ICD-10-CM | POA: Diagnosis not present

## 2024-02-17 ENCOUNTER — Ambulatory Visit: Admitting: Radiation Oncology

## 2024-02-18 ENCOUNTER — Ambulatory Visit: Admitting: Radiation Oncology

## 2024-02-19 ENCOUNTER — Other Ambulatory Visit: Payer: Self-pay

## 2024-02-19 ENCOUNTER — Ambulatory Visit
Admission: RE | Admit: 2024-02-19 | Discharge: 2024-02-19 | Disposition: A | Discharge status: Home / Self Care | Source: Ambulatory Visit | Attending: Radiation Oncology | Admitting: Radiation Oncology

## 2024-02-19 DIAGNOSIS — C3491 Malignant neoplasm of unspecified part of right bronchus or lung: Secondary | ICD-10-CM | POA: Diagnosis present

## 2024-02-19 DIAGNOSIS — C3411 Malignant neoplasm of upper lobe, right bronchus or lung: Secondary | ICD-10-CM | POA: Diagnosis present

## 2024-02-19 LAB — RAD ONC ARIA SESSION SUMMARY
Course Elapsed Days: 0
Plan Fractions Treated to Date: 1
Plan Prescribed Dose Per Fraction: 18 Gy
Plan Total Fractions Prescribed: 3
Plan Total Prescribed Dose: 54 Gy
Reference Point Dosage Given to Date: 18 Gy
Reference Point Session Dosage Given: 18 Gy
Session Number: 1

## 2024-02-20 ENCOUNTER — Ambulatory Visit: Admitting: Radiation Oncology

## 2024-02-21 ENCOUNTER — Ambulatory Visit
Admission: RE | Admit: 2024-02-21 | Discharge: 2024-02-21 | Disposition: A | Discharge status: Home / Self Care | Source: Ambulatory Visit | Attending: Radiation Oncology | Admitting: Radiation Oncology

## 2024-02-21 ENCOUNTER — Other Ambulatory Visit: Payer: Self-pay

## 2024-02-21 DIAGNOSIS — C3491 Malignant neoplasm of unspecified part of right bronchus or lung: Secondary | ICD-10-CM | POA: Diagnosis not present

## 2024-02-21 LAB — RAD ONC ARIA SESSION SUMMARY
Course Elapsed Days: 2
Plan Fractions Treated to Date: 2
Plan Prescribed Dose Per Fraction: 18 Gy
Plan Total Fractions Prescribed: 3
Plan Total Prescribed Dose: 54 Gy
Reference Point Dosage Given to Date: 36 Gy
Reference Point Session Dosage Given: 18 Gy
Session Number: 2

## 2024-02-25 ENCOUNTER — Ambulatory Visit
Admission: RE | Admit: 2024-02-25 | Discharge: 2024-02-25 | Disposition: A | Discharge status: Home / Self Care | Source: Ambulatory Visit | Attending: Radiation Oncology | Admitting: Radiation Oncology

## 2024-02-25 ENCOUNTER — Other Ambulatory Visit: Payer: Self-pay

## 2024-02-25 DIAGNOSIS — C3411 Malignant neoplasm of upper lobe, right bronchus or lung: Secondary | ICD-10-CM

## 2024-02-25 DIAGNOSIS — C3491 Malignant neoplasm of unspecified part of right bronchus or lung: Secondary | ICD-10-CM | POA: Diagnosis not present

## 2024-02-25 LAB — RAD ONC ARIA SESSION SUMMARY
Course Elapsed Days: 6
Plan Fractions Treated to Date: 3
Plan Prescribed Dose Per Fraction: 18 Gy
Plan Total Fractions Prescribed: 3
Plan Total Prescribed Dose: 54 Gy
Reference Point Dosage Given to Date: 54 Gy
Reference Point Session Dosage Given: 18 Gy
Session Number: 3

## 2024-02-26 NOTE — Radiation Completion Notes (Addendum)
  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    The patient will return in one month and will continue follow up with Dr. Timmy as well.      Ronita Due, PA-C

## 2024-02-26 NOTE — Radiation Completion Notes (Incomplete Revision)
 Patient Name: Robert Tran, Robert Tran MRN: [id number] Date of Birth: 25-Sep-1940 Referring Physician: MAUDE CREASE, M.D. Date of Service: 2024-02-26 Radiation Oncologist: Signe Nasuti, M.D. [facility]                             RADIATION ONCOLOGY END OF TREATMENT NOTE     Diagnosis: C34.11 Malignant neoplasm of upper lobe, right bronchus or lung Intent: Curative     ==========DELIVERED PLANS==========  First Treatment Date: 2024-02-19 Last Treatment Date: 2024-02-25   Plan Name: Lung_R_SBRT Site: Lung, Right Technique: SBRT/SRT-IMRT Mode: Photon Dose Per Fraction: 18 Gy Prescribed Dose (Delivered / Prescribed): 54 Gy / 54 Gy Prescribed Fxs (Delivered / Prescribed): 3 / 3     ==========ON TREATMENT VISIT DATES========== 2024-02-19, 2024-02-21, 2024-02-25, 2024-02-25     ==========UPCOMING VISITS========== 04/06/2024 [facility] EST PT 15 CREASE MAUDE SAUNDERS, MD  04/06/2024 [facility]  03/30/2024 [facility] FOLLOW UP 15 Nasuti Agent, MD        ==========APPENDIX - ON TREATMENT VISIT NOTES==========   See weekly On Treatment Notes in Epic for details in the Media tab (listed as Progress notes on the On Treatment Visit Dates listed above).

## 2024-02-27 ENCOUNTER — Ambulatory Visit: Admitting: Radiation Oncology

## 2024-03-24 NOTE — Addendum Note (Signed)
 Encounter addended by: Wyatt Leeroy HERO, PA-C on: 03/24/2024 1:50 PM  Actions taken: Clinical Note Signed

## 2024-03-27 ENCOUNTER — Encounter: Payer: Self-pay | Admitting: Radiation Oncology

## 2024-03-29 NOTE — Progress Notes (Signed)
 Radiation Oncology         ([phone] ________________________________  Name: Robert Tran MRN: [id number]  Date: 03/30/2024  DOB: 20-Jan-1941  Follow-Up Visit Note  CC: Magdaline Debby HERO, MD  Magdaline Debby HERO, MD    ICD-10-CM   1. 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; s/p SBRT to 2 right lung nodules in 2021 and left lingula in 2022, now with an enlarging right upper lobe nodule; s/p SBRT completed on 02/25/2024  Interval Since Last Radiation: 1 month and 3 days   3) Intent: Curative Radiation Treatment Dates: First Treatment Date: 2024-02-19 -- Last Treatment Date: 2024-02-25 Site/Dose/Technique/Mode:  Plan Name: Lung_R_SBRT Site: Lung, Right Technique: SBRT/SRT-IMRT Mode: Photon Dose Per Fraction: 18 Gy Prescribed Dose (Delivered / Prescribed): 54 Gy / 54 Gy Prescribed Fxs (Delivered / Prescribed): 3 / 3  2) Radiation Treatment Dates: 07/12/2021 through 07/19/2021 Site Technique Total Dose (Gy) Dose per Fx (Gy) Completed Fx Beam Energies  Lung, Left: Lung_L IMRT 54/54 18 3/3 [id number]    1)  04/26/2020 - 05/06/2020 (60 Gy delivered in 5 fractions of 12 Gy each; SBRT/SRT-IMRT directed at the right lung)  Narrative:  The patient returns today for routine follow-up. He tolerated radiation therapy relatively well overall without any significant side effects.   Prior to starting his recent course of radiation therapy, he followed up with Dr. Timmy on 02/03/24. He was noted to be doing well at that time other than some ongoing weight loss which he has started taking protein shakes to address. Dr. Timmy would also like him to return for a repeat PET scan in October or November for restaging of his disease.   No other significant oncologic interval history since the patient completed radiation therapy or since he began radiation therapy.   Patient reports to be feeling  much better since completing his radiation treatment. He notes an episode of extreme fatigue and low appetite upon completion of his radiotherapy. This has since improved. He notes a 65-month history of sinus drainage and productive cough. This has been treated with antibiotics and steroids with minimal improvement. He continues to smoke approximately 1 ppd.                 Allergies:  is allergic to pravastatin sodium, minocycline, sulfa antibiotics, and sulfa drugs cross reactors.  Meds: Current Outpatient Medications  Medication Sig Dispense Refill   albuterol  (VENTOLIN  HFA) 108 (90 Base) MCG/ACT inhaler Inhale 2 puffs into the lungs every 6 (six) hours as needed for wheezing or shortness of breath. 18 g 5   amLODipine (NORVASC) 10 MG tablet Take 10 mg by mouth daily.     aspirin EC 81 MG tablet Take 81 mg by mouth daily.     Cholecalciferol (VITAMIN D) 125 MCG (5000 UT) CAPS Take 5,000 Units by mouth daily.      clopidogrel  (PLAVIX ) 75 MG tablet Take 1 tablet (75 mg total) by mouth daily. Okay to restart this medication on 02/20/2020.     empagliflozin (JARDIANCE) 10 MG TABS tablet Take 1 tablet by mouth daily.     fluocinonide (LIDEX) 0.05 % external solution Apply 1 application topically every other day.      folic acid (FOLVITE) 400 MCG tablet Take 400 mcg by mouth every morning.      glucose blood test strip 1 each by Other route.  hydrocortisone 2.5 % cream 1 application daily as needed.     metFORMIN (GLUCOPHAGE-XR) 500 MG 24 hr tablet Take 1,000 mg by mouth at bedtime.      metoprolol succinate (TOPROL-XL) 25 MG 24 hr tablet Take 25 mg by mouth daily.     pantoprazole  (PROTONIX ) 40 MG tablet Take 1 tablet (40 mg total) by mouth 2 (two) times daily. 60 tablet 6   ramipril (ALTACE) 10 MG tablet Take 10 mg by mouth daily.     tamsulosin (FLOMAX) 0.4 MG CAPS capsule Take 0.4 mg by mouth daily as needed (kidney stones).     Inclisiran Sodium (LEQVIO [place]) Inject into the skin. Receives  injection in Dr. Burke office in [facility].     tiotropium (SPIRIVA) 18 MCG inhalation capsule Place 18 mcg into inhaler and inhale daily. Only  uses prn. (Patient not taking: Reported on 03/30/2024)     No current facility-administered medications for this encounter.    Physical Findings: The patient is in no acute distress. Patient is alert and oriented.  height is 5' 5 (1.651 m) and weight is 151 lb 12.8 oz (68.9 kg). His temperature is 97.3 F (36.3 C) (abnormal). His blood pressure is 124/64 and his pulse is 79. His respiration is 18 and oxygen saturation is 96%. .  No significant changes. Lungs are clear to auscultation bilaterally. Heart has regular rate and rhythm. No palpable cervical or supraclavicular adenopathy. Abdomen soft, non-tender, normal bowel sounds.   Lab Findings: Lab Results  Component Value Date   WBC 10.0 02/03/2024   HGB 14.9 02/03/2024   HCT 47.4 02/03/2024   MCV 86.2 02/03/2024   PLT 305 02/03/2024    Radiographic Findings: No results found.  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 He is scheduled to follow-up with Dr. Timmy on 04/06/2024. Radiation follow-up PRN. We appreciate the opportunity to take part in this patient's care.   Smoking cessation instruction/counseling given:  counseled patient on the dangers of tobacco use, advised patient to stop smoking, and reviewed strategies to maximize success. Patient is motivated to quit and has picked a quit date of 04/29/2024.    20 minutes of total time was spent for this patient encounter, including preparation, face-to-face counseling with the patient and coordination of care, physical exam, and documentation of the encounter.  Greater than 3  minutes were spent on smoking cessation counseling. ____________________________________   Leeroy Due, PA-C   This document serves as a record of services personally performed by Leeroy Due, PA-C. It was created on his behalf by Dorthy Fuse, a trained medical scribe. The creation of this record is based on the scribe's personal observations and the provider's statements to them. This document has been checked and approved by the attending provider.

## 2024-03-30 ENCOUNTER — Encounter: Payer: Self-pay | Admitting: Radiation Oncology

## 2024-03-30 ENCOUNTER — Ambulatory Visit
Admission: RE | Admit: 2024-03-30 | Discharge: 2024-03-30 | Disposition: A | Discharge status: Home / Self Care | Source: Ambulatory Visit | Attending: Radiation Oncology | Admitting: Radiation Oncology

## 2024-03-30 VITALS — BP 124/64 | HR 79 | Temp 97.3°F | Resp 18 | Ht 65.0 in | Wt 151.8 lb

## 2024-03-30 DIAGNOSIS — C3411 Malignant neoplasm of upper lobe, right bronchus or lung: Secondary | ICD-10-CM | POA: Insufficient documentation

## 2024-03-30 DIAGNOSIS — Z923 Personal history of irradiation: Secondary | ICD-10-CM | POA: Diagnosis not present

## 2024-03-30 DIAGNOSIS — F17210 Nicotine dependence, cigarettes, uncomplicated: Secondary | ICD-10-CM | POA: Diagnosis not present

## 2024-03-30 DIAGNOSIS — Z79899 Other long term (current) drug therapy: Secondary | ICD-10-CM | POA: Diagnosis not present

## 2024-03-30 DIAGNOSIS — Z7984 Long term (current) use of oral hypoglycemic drugs: Secondary | ICD-10-CM | POA: Diagnosis not present

## 2024-03-30 DIAGNOSIS — Z7982 Long term (current) use of aspirin: Secondary | ICD-10-CM | POA: Diagnosis not present

## 2024-03-30 DIAGNOSIS — Z7902 Long term (current) use of antithrombotics/antiplatelets: Secondary | ICD-10-CM | POA: Diagnosis not present

## 2024-03-30 NOTE — Progress Notes (Signed)
 Robert Tran is here today for follow up post radiation to the lung.  Lung Side: Right, patient completed treatment on 02/25/24.   Does the patient complain of any of the following: Pain: No pain reports some numbness to right side after treatment.  Shortness of breath w/wo exertion: No Cough: yes, Productive cough.  Hemoptysis: No Pain with swallowing: No Swallowing/choking concerns: No Appetite: Good Energy Level: Fair  Post radiation skin Changes: No    Additional comments if applicable:   BP 124/64 (BP Location: Left Arm, Patient Position: Sitting, Cuff Size: Large)   Pulse 79   Temp (!) 97.3 F (36.3 C)   Resp 18   Ht 5' 5 (1.651 m)   Wt 151 lb 12.8 oz (68.9 kg)   SpO2 96%   BMI 25.26 kg/m

## 2024-04-06 ENCOUNTER — Encounter: Payer: Self-pay | Admitting: Medical Oncology

## 2024-04-06 ENCOUNTER — Inpatient Hospital Stay: Attending: Hematology & Oncology

## 2024-04-06 ENCOUNTER — Inpatient Hospital Stay (HOSPITAL_BASED_OUTPATIENT_CLINIC_OR_DEPARTMENT_OTHER): Admitting: Medical Oncology

## 2024-04-06 ENCOUNTER — Inpatient Hospital Stay

## 2024-04-06 VITALS — BP 127/63 | HR 71 | Temp 97.6°F | Resp 18 | Ht 65.0 in | Wt 149.1 lb

## 2024-04-06 DIAGNOSIS — C3411 Malignant neoplasm of upper lobe, right bronchus or lung: Secondary | ICD-10-CM | POA: Diagnosis not present

## 2024-04-06 DIAGNOSIS — D45 Polycythemia vera: Secondary | ICD-10-CM | POA: Insufficient documentation

## 2024-04-06 LAB — CMP (CANCER CENTER ONLY)
ALT: 9 U/L (ref 0–44)
AST: 11 U/L — ABNORMAL LOW (ref 15–41)
Albumin: 4.4 g/dL (ref 3.5–5.0)
Alkaline Phosphatase: 78 U/L (ref 38–126)
Anion gap: 10 (ref 5–15)
BUN: 24 mg/dL — ABNORMAL HIGH (ref 8–23)
CO2: 23 mmol/L (ref 22–32)
Calcium: 10.9 mg/dL — ABNORMAL HIGH (ref 8.9–10.3)
Chloride: 105 mmol/L (ref 98–111)
Creatinine: 1.18 mg/dL (ref 0.61–1.24)
GFR, Estimated: >60 mL/min (ref >60)
Glucose, Bld: 163 mg/dL — ABNORMAL HIGH (ref 70–99)
Potassium: 4.9 mmol/L (ref 3.5–5.1)
Sodium: 138 mmol/L (ref 135–145)
Total Bilirubin: 0.3 mg/dL (ref 0.0–1.2)
Total Protein: 7.2 g/dL (ref 6.5–8.1)

## 2024-04-06 LAB — RETICULOCYTES
Immature Retic Fract: 18.9 % — ABNORMAL HIGH (ref 2.3–15.9)
RBC.: 5.52 MIL/uL (ref 4.22–5.81)
Retic Count, Absolute: 108.7 K/uL (ref 19.0–186.0)
Retic Ct Pct: 2 % (ref 0.4–3.1)

## 2024-04-06 LAB — CBC WITH DIFFERENTIAL (CANCER CENTER ONLY)
Abs Immature Granulocytes: 0.13 K/uL — ABNORMAL HIGH (ref 0.00–0.07)
Basophils Absolute: 0.1 K/uL (ref 0.0–0.1)
Basophils Relative: 1 %
Eosinophils Absolute: 0.3 K/uL (ref 0.0–0.5)
Eosinophils Relative: 4 %
HCT: 49.5 % (ref 39.0–52.0)
Hemoglobin: 15.5 g/dL (ref 13.0–17.0)
Immature Granulocytes: 2 %
Lymphocytes Relative: 19 %
Lymphs Abs: 1.6 K/uL (ref 0.7–4.0)
MCH: 27.9 pg (ref 26.0–34.0)
MCHC: 31.3 g/dL (ref 30.0–36.0)
MCV: 89 fL (ref 80.0–100.0)
Monocytes Absolute: 0.6 K/uL (ref 0.1–1.0)
Monocytes Relative: 7 %
Neutro Abs: 5.6 K/uL (ref 1.7–7.7)
Neutrophils Relative %: 67 %
Platelet Count: 302 K/uL (ref 150–400)
RBC: 5.56 MIL/uL (ref 4.22–5.81)
RDW: 17.4 % — ABNORMAL HIGH (ref 11.5–15.5)
WBC Count: 8.3 K/uL (ref 4.0–10.5)
nRBC: 0 % (ref 0.0–0.2)

## 2024-04-06 LAB — FERRITIN: Ferritin: 23 ng/mL — ABNORMAL LOW (ref 24–336)

## 2024-04-06 LAB — IRON AND IRON BINDING CAPACITY (CC-WL,HP ONLY)
Iron: 39 ug/dL — ABNORMAL LOW (ref 45–182)
Saturation Ratios: 9 % — ABNORMAL LOW (ref 17.9–39.5)
TIBC: 424 ug/dL (ref 250–450)
UIBC: 385 ug/dL

## 2024-04-06 NOTE — Progress Notes (Signed)
 Robert Tran presents today for phlebotomy per MD orders. Phlebotomy procedure started at 1050 and ended at 1110. 525 grams removed. Patient observed for 30 minutes after procedure without any incident. Patient tolerated procedure well. IV needle removed intact.

## 2024-04-06 NOTE — Patient Instructions (Signed)

## 2024-04-06 NOTE — Progress Notes (Signed)
 Get her 1:00 Hematology and Oncology Follow Up Visit  Robert Tran [id number] [birth year] 83 y.o. 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  He is doing very well.    PET scan from 01/23/2024 showed one lesion with slightly higher SUV.  It measured 1.4 x 0.9 cm. He has now completed SBRT. He reports being fatigued, loss of appetite and some SOB following treatment. This has slowly improved with time. He is feeling like his normal self again.   He has had no nausea or vomiting.  He has had no cough or new shortness of breath.  He has had no change in bowel or bladder habits.   He is now on an injectable anticholesterol medicine once every 6 months.  He has been having some pain in his hips.  He thinks this might be from the cholesterol medication..  He has had no leg swelling.  He has had no headache.  He has had no rashes.  His last iron studies that were done showed a ferritin of 31 with an iron saturation of 12%.  Overall, I would say his performance status is probably ECOG 1.   Wt Readings from Last 3 Encounters:  04/06/24 149 lb 1.3 oz (67.6 kg)  03/30/24 151 lb 12.8 oz (68.9 kg)  02/03/24 151 lb 6.4 oz (68.7 kg)     Medications:  Allergies as of 04/06/2024       Reactions   Pravastatin Sodium Other (See Comments)   Patient stated,it messed up my back and legs, I couldn't walk.   Minocycline Nausea Only, Nausea And Vomiting   Sulfa Antibiotics Nausea Only, Other (See Comments)   Dizziness   Sulfa Drugs Cross Reactors Nausea Only         Medication List        Accurate as of April 06, 2024 10:59 AM. If you have any questions, ask your nurse or doctor.          albuterol  108 (90 Base) MCG/ACT inhaler Commonly known as: VENTOLIN  HFA Inhale 2 puffs into the lungs every 6 (six) hours as needed for wheezing or shortness of breath.   amLODipine 10 MG tablet Commonly known as: NORVASC Take 10 mg by mouth daily.   aspirin EC 81 MG tablet Take 81 mg by mouth daily.   clopidogrel  75 MG tablet Commonly known as: Plavix  Take 1 tablet (75 mg total) by mouth daily. Okay to restart this medication on 02/20/2020.   empagliflozin 10 MG Tabs tablet Commonly known as: JARDIANCE Take 1 tablet by mouth daily.   fluocinonide 0.05 % external solution Commonly known as: LIDEX Apply 1 application topically every other day.   folic acid 400 MCG tablet Commonly known as: FOLVITE Take 400 mcg by mouth every morning.   glucose blood test strip 1 each by Other route.   hydrocortisone 2.5 % cream 1 application daily as needed.   LEQVIO [place] Inject into the skin. Receives injection in Dr. Burke office in [facility].   metFORMIN 500 MG 24 hr tablet Commonly known as: GLUCOPHAGE-XR Take 1,000 mg by mouth at  bedtime.   metoprolol succinate 25 MG 24 hr tablet Commonly known as: TOPROL-XL Take 25 mg by mouth daily.   pantoprazole  40 MG tablet Commonly known as: Protonix  Take 1 tablet (40 mg total) by mouth 2 (two) times daily.   ramipril 10 MG tablet Commonly known as: ALTACE Take 10 mg by mouth daily.   tamsulosin 0.4 MG Caps capsule Commonly known as: FLOMAX Take 0.4 mg by mouth daily as needed (kidney stones).   tiotropium 18 MCG inhalation capsule Commonly known as: SPIRIVA Place 18 mcg into inhaler and inhale daily. Only  uses prn.   Vitamin D 125 MCG (5000 UT) Caps Take 5,000 Units by mouth daily.        Allergies:  Allergies  Allergen Reactions   Pravastatin Sodium Other (See Comments)     Patient stated,it messed up my back and legs, I couldn't walk.   Minocycline Nausea Only and Nausea And Vomiting   Sulfa Antibiotics Nausea Only and Other (See Comments)    Dizziness   Sulfa Drugs Cross Reactors Nausea Only    Past Medical History, Surgical history, Social history, and Family History were reviewed and updated.  Review of Systems: Review of Systems  Constitutional: Negative.   HENT: Negative.    Eyes: Negative.   Respiratory: Negative.    Cardiovascular: Negative.   Gastrointestinal: Negative.   Genitourinary: Negative.   Musculoskeletal: Negative.   Skin: Negative.   Neurological: Negative.   Endo/Heme/Allergies: Negative.   Psychiatric/Behavioral: Negative.      Physical Exam:  height is 5' 5 (1.651 m) and weight is 149 lb 1.3 oz (67.6 kg). His oral temperature is 97.6 F (36.4 C). His blood pressure is 127/63 and his pulse is 71. His respiration is 18 and oxygen saturation is 95%.   Wt Readings from Last 3 Encounters:  04/06/24 149 lb 1.3 oz (67.6 kg)  03/30/24 151 lb 12.8 oz (68.9 kg)  02/03/24 151 lb 6.4 oz (68.7 kg)    Physical Exam Vitals reviewed.  HENT:     Head: Normocephalic and atraumatic.  Eyes:     Pupils: Pupils are equal, round, and reactive to light.  Cardiovascular:     Rate and Rhythm: Normal rate and regular rhythm.     Heart sounds: Normal heart sounds.  Pulmonary:     Effort: Pulmonary effort is normal.     Breath sounds: Normal breath sounds.  Abdominal:     General: Bowel sounds are normal.     Palpations: Abdomen is soft.  Musculoskeletal:        General: No tenderness or deformity. Normal range of motion.     Cervical back: Normal range of motion.  Lymphadenopathy:     Cervical: No cervical adenopathy.  Skin:    General: Skin is warm and dry.     Findings: No erythema or rash.  Neurological:     Mental Status: He is alert and oriented to person, place, and time.  Psychiatric:        Behavior: Behavior normal.         Thought Content: Thought content normal.        Judgment: Judgment normal.      Lab Results  Component Value Date   WBC 8.3 04/06/2024   HGB 15.5 04/06/2024   HCT 49.5 04/06/2024   MCV 89.0 04/06/2024   PLT 302 04/06/2024   Lab Results  Component Value Date   FERRITIN 31 02/03/2024   IRON 51 02/03/2024   TIBC  433 02/03/2024   UIBC 382 (H) 02/03/2024   IRONPCTSAT 12 (L) 02/03/2024   Lab Results  Component Value Date   RETICCTPCT 2.0 04/06/2024   RBC 5.52 04/06/2024   RBC 5.56 04/06/2024   RETICCTABS 77.3 06/20/2015   No results found for: KPAFRELGTCHN, LAMBDASER, KAPLAMBRATIO No results found for: IGGSERUM, IGA, IGMSERUM No results found for: STEPHANY CARLOTA BENSON MARKEL EARLA JOANNIE DOC, MSPIKE, SPEI   Chemistry      Component Value Date/Time   NA 138 04/06/2024 0930   NA 142 07/18/2017 1421   NA 134 (L) 08/09/2016 1313   K 4.9 04/06/2024 0930   K 4.0 07/18/2017 1421   K 4.2 08/09/2016 1313   CL 105 04/06/2024 0930   CL 103 07/18/2017 1421   CO2 23 04/06/2024 0930   CO2 29 07/18/2017 1421   CO2 20 (L) 08/09/2016 1313   BUN 24 (H) 04/06/2024 0930   BUN 19 07/18/2017 1421   BUN 19.9 08/09/2016 1313   CREATININE 1.18 04/06/2024 0930   CREATININE 1.4 (H) 07/18/2017 1421   CREATININE 1.3 08/09/2016 1313      Component Value Date/Time   CALCIUM 10.9 (H) 04/06/2024 0930   CALCIUM 10.2 07/18/2017 1421   CALCIUM 10.0 08/09/2016 1313   ALKPHOS 78 04/06/2024 0930   ALKPHOS 65 07/18/2017 1421   ALKPHOS 75 08/09/2016 1313   AST 11 (L) 04/06/2024 0930   AST 14 08/09/2016 1313   ALT 9 04/06/2024 0930   ALT 21 07/18/2017 1421   ALT 23 08/09/2016 1313   BILITOT 0.3 04/06/2024 0930   BILITOT 0.34 08/09/2016 1313     Encounter Diagnoses  Name Primary?   Polycythemia vera (HCC) Yes   Primary cancer of right upper lobe of lung (HCC)     Impression and Plan: Mr. Linn is a 83 year old male. He has secondary  polycythemia for which her gets periodic phlebotomies. He is recently S/p SBRT for a right upper lobe lung nodule.   Today his Hct is 49.5%- phlebotomy needed PET scan to be placed for November.   Phlebotomy today RTC 2 months MD, labs(CBC w/, CMP, iron, ferritin, retic), Phlebotomy   Lauraine CHRISTELLA Dais, PA-C 9/22/202510:59 AM

## 2024-06-01 ENCOUNTER — Encounter (HOSPITAL_COMMUNITY)
Admission: RE | Admit: 2024-06-01 | Discharge: 2024-06-01 | Disposition: A | Discharge status: Home / Self Care | Source: Ambulatory Visit | Attending: Medical Oncology | Admitting: Medical Oncology

## 2024-06-01 DIAGNOSIS — C3411 Malignant neoplasm of upper lobe, right bronchus or lung: Secondary | ICD-10-CM | POA: Diagnosis present

## 2024-06-01 LAB — GLUCOSE, CAPILLARY: Glucose-Capillary: 119 mg/dL — ABNORMAL HIGH (ref 70–99)

## 2024-06-01 MED ORDER — FLUDEOXYGLUCOSE F - 18 (FDG) INJECTION
7.5000 | Freq: Once | INTRAVENOUS | Status: AC | PRN
  Administered 2024-06-01: 7.46 via INTRAVENOUS

## 2024-06-02 ENCOUNTER — Ambulatory Visit: Payer: Self-pay | Admitting: Hematology & Oncology

## 2024-06-08 ENCOUNTER — Ambulatory Visit: Admitting: Hematology & Oncology

## 2024-06-08 ENCOUNTER — Encounter

## 2024-06-08 ENCOUNTER — Inpatient Hospital Stay: Attending: Hematology & Oncology

## 2024-06-15 ENCOUNTER — Inpatient Hospital Stay

## 2024-06-15 ENCOUNTER — Inpatient Hospital Stay: Admitting: Hematology & Oncology

## 2024-06-15 ENCOUNTER — Inpatient Hospital Stay: Attending: Hematology & Oncology

## 2024-06-15 ENCOUNTER — Encounter: Payer: Self-pay | Admitting: Hematology & Oncology

## 2024-06-15 ENCOUNTER — Other Ambulatory Visit: Payer: Self-pay

## 2024-06-15 VITALS — BP 129/46 | HR 80 | Temp 97.5°F | Resp 18 | Ht 65.0 in | Wt 149.0 lb

## 2024-06-15 DIAGNOSIS — C3411 Malignant neoplasm of upper lobe, right bronchus or lung: Secondary | ICD-10-CM | POA: Diagnosis present

## 2024-06-15 DIAGNOSIS — D45 Polycythemia vera: Secondary | ICD-10-CM | POA: Diagnosis not present

## 2024-06-15 LAB — CBC WITH DIFFERENTIAL (CANCER CENTER ONLY)
Abs Immature Granulocytes: 0.08 K/uL — ABNORMAL HIGH (ref 0.00–0.07)
Basophils Absolute: 0.1 K/uL (ref 0.0–0.1)
Basophils Relative: 1 %
Eosinophils Absolute: 0.4 K/uL (ref 0.0–0.5)
Eosinophils Relative: 5 %
HCT: 49.4 % (ref 39.0–52.0)
Hemoglobin: 15.5 g/dL (ref 13.0–17.0)
Immature Granulocytes: 1 %
Lymphocytes Relative: 21 %
Lymphs Abs: 1.6 K/uL (ref 0.7–4.0)
MCH: 28.2 pg (ref 26.0–34.0)
MCHC: 31.4 g/dL (ref 30.0–36.0)
MCV: 90 fL (ref 80.0–100.0)
Monocytes Absolute: 0.5 K/uL (ref 0.1–1.0)
Monocytes Relative: 7 %
Neutro Abs: 5 K/uL (ref 1.7–7.7)
Neutrophils Relative %: 65 %
Platelet Count: 263 K/uL (ref 150–400)
RBC: 5.49 MIL/uL (ref 4.22–5.81)
RDW: 15 % (ref 11.5–15.5)
WBC Count: 7.5 K/uL (ref 4.0–10.5)
nRBC: 0 % (ref 0.0–0.2)

## 2024-06-15 LAB — CMP (CANCER CENTER ONLY)
ALT: 7 U/L (ref 0–44)
AST: 12 U/L — ABNORMAL LOW (ref 15–41)
Albumin: 4.3 g/dL (ref 3.5–5.0)
Alkaline Phosphatase: 77 U/L (ref 38–126)
Anion gap: 10 (ref 5–15)
BUN: 22 mg/dL (ref 8–23)
CO2: 23 mmol/L (ref 22–32)
Calcium: 10.7 mg/dL — ABNORMAL HIGH (ref 8.9–10.3)
Chloride: 106 mmol/L (ref 98–111)
Creatinine: 1.21 mg/dL (ref 0.61–1.24)
GFR, Estimated: 59 mL/min — ABNORMAL LOW (ref >60)
Glucose, Bld: 196 mg/dL — ABNORMAL HIGH (ref 70–99)
Potassium: 5 mmol/L (ref 3.5–5.1)
Sodium: 139 mmol/L (ref 135–145)
Total Bilirubin: 0.4 mg/dL (ref 0.0–1.2)
Total Protein: 7.1 g/dL (ref 6.5–8.1)

## 2024-06-15 LAB — IRON AND IRON BINDING CAPACITY (CC-WL,HP ONLY)
Iron: 59 ug/dL (ref 45–182)
Saturation Ratios: 13 % — ABNORMAL LOW (ref 17.9–39.5)
TIBC: 468 ug/dL — ABNORMAL HIGH (ref 250–450)
UIBC: 409 ug/dL

## 2024-06-15 LAB — RETIC PANEL
Immature Retic Fract: 22.7 % — ABNORMAL HIGH (ref 2.3–15.9)
RBC.: 5.54 MIL/uL (ref 4.22–5.81)
Retic Count, Absolute: 106.9 K/uL (ref 19.0–186.0)
Retic Ct Pct: 1.9 % (ref 0.4–3.1)
Reticulocyte Hemoglobin: 29.6 pg (ref >27.9)

## 2024-06-15 LAB — FERRITIN: Ferritin: 17 ng/mL — ABNORMAL LOW (ref 24–336)

## 2024-06-15 NOTE — Progress Notes (Signed)
 One unit phlebotomy taken from right ac without incident.  Patient tolerated well and nourishments given.  End bag weight was 545 gms.

## 2024-06-15 NOTE — Progress Notes (Signed)
 Get her 1:00 Hematology and Oncology Follow Up Visit  Robert Tran [id number] 1941/04/28 83 y.o. 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  He looks quite good.  He feels okay.  He and his family had a wonderful Thanksgiving holiday.  He has had no issues with cough or shortness of breath.  He has had no nausea or vomiting.  We did do a PET scan on him.  This was done on 06/01/2024.  This did show improvement in the right upper nodule.  It measured 8 x 3 mm with a SUV of 1.7.  Outside of that, everything else looked fantastic.  He has had no headache.  He has had no change in bowel or bladder habits.  He has had no leg swelling.  He has had no bleeding.  His appetite has been quite good.  His last iron studies showed a ferritin of 23 with an iron saturation of 9%.  Overall, I will say that his performance status is probably ECOG 1.    Medications:  Allergies as of 06/15/2024       Reactions   Pravastatin Sodium Other (See Comments)   Patient stated,it messed up my back and legs, I couldn't walk.   Minocycline Nausea Only, Nausea And Vomiting   Sulfa Antibiotics Nausea Only, Other (See Comments)   Dizziness   Sulfa Drugs Cross Reactors Nausea Only        Medication List        Accurate as of June 15, 2024 10:44 AM. If you have any questions, ask your nurse or doctor.          albuterol  108 (90 Base) MCG/ACT inhaler Commonly known as: VENTOLIN  HFA Inhale 2 puffs into the lungs every 6 (six) hours as needed for wheezing or shortness of breath.   amLODipine 5 MG tablet Commonly known as:  NORVASC Take 5 mg by mouth daily. What changed: Another medication with the same name was removed. Continue taking this medication, and follow the directions you see here. Changed by: Maude JONELLE Crease   aspirin EC 81 MG tablet Take 81 mg by mouth 3 (three) times a week. Swallow whole. What changed: Another medication with the same name was removed. Continue taking this medication, and follow the directions you see here. Changed by: Maude JONELLE Crease   clopidogrel  75 MG tablet Commonly known as: Plavix  Take 1 tablet (75 mg total) by mouth daily. Okay to restart this medication on 02/20/2020.   empagliflozin 10 MG Tabs tablet Commonly known as: JARDIANCE Take 1 tablet by mouth daily.   fluocinonide 0.05 % external solution Commonly known as: LIDEX Apply 1 application topically every other day.   folic acid 400 MCG tablet Commonly known as: FOLVITE Take 400 mcg by mouth every morning.   glucose blood test strip 1 each by Other route.   hydrocortisone 2.5 % cream 1 application daily as needed.   LEQVIO [place] Inject into the skin. Receives injection in Dr. Burke office in [facility].   metFORMIN 500 MG 24 hr tablet Commonly known as: GLUCOPHAGE-XR Take 1,000 mg by mouth at bedtime.   metoprolol succinate 25  MG 24 hr tablet Commonly known as: TOPROL-XL Take 25 mg by mouth daily.   pantoprazole  40 MG tablet Commonly known as: Protonix  Take 1 tablet (40 mg total) by mouth 2 (two) times daily.   ramipril 10 MG tablet Commonly known as: ALTACE Take 10 mg by mouth daily.   tamsulosin 0.4 MG Caps capsule Commonly known as: FLOMAX Take 0.4 mg by mouth daily as needed (kidney stones).   tiotropium 18 MCG inhalation capsule Commonly known as: SPIRIVA Place 18 mcg into inhaler and inhale daily. Only  uses prn.   Vitamin D 125 MCG (5000 UT) Caps Take 5,000 Units by mouth daily.        Allergies:  Allergies  Allergen Reactions   Pravastatin Sodium Other (See Comments)     Patient stated,it messed up my back and legs, I couldn't walk.   Minocycline Nausea Only and Nausea And Vomiting   Sulfa Antibiotics Nausea Only and Other (See Comments)    Dizziness   Sulfa Drugs Cross Reactors Nausea Only    Past Medical History, Surgical history, Social history, and Family History were reviewed and updated.  Review of Systems: Review of Systems  Constitutional: Negative.   HENT: Negative.    Eyes: Negative.   Respiratory: Negative.    Cardiovascular: Negative.   Gastrointestinal: Negative.   Genitourinary: Negative.   Musculoskeletal: Negative.   Skin: Negative.   Neurological: Negative.   Endo/Heme/Allergies: Negative.   Psychiatric/Behavioral: Negative.      Physical Exam:  height is 5' 5 (1.651 m) and weight is 149 lb (67.6 kg). His oral temperature is 97.5 F (36.4 C) (abnormal). His blood pressure is 129/46 (abnormal) and his pulse is 80. His respiration is 18 and oxygen saturation is 95%.   Wt Readings from Last 3 Encounters:  06/15/24 149 lb (67.6 kg)  04/06/24 149 lb 1.3 oz (67.6 kg)  03/30/24 151 lb 12.8 oz (68.9 kg)    Physical Exam Vitals reviewed.  HENT:     Head: Normocephalic and atraumatic.  Eyes:     Pupils: Pupils are equal, round, and reactive to light.  Cardiovascular:     Rate and Rhythm: Normal rate and regular rhythm.     Heart sounds: Normal heart sounds.  Pulmonary:     Effort: Pulmonary effort is normal.     Breath sounds: Normal breath sounds.  Abdominal:     General: Bowel sounds are normal.     Palpations: Abdomen is soft.  Musculoskeletal:        General: No tenderness or deformity. Normal range of motion.     Cervical back: Normal range of motion.  Lymphadenopathy:     Cervical: No cervical adenopathy.  Skin:    General: Skin is warm and dry.     Findings: No erythema or rash.  Neurological:     Mental Status: He is alert and oriented to person, place, and time.  Psychiatric:        Behavior: Behavior  normal.        Thought Content: Thought content normal.        Judgment: Judgment normal.      Lab Results  Component Value Date   WBC 7.5 06/15/2024   HGB 15.5 06/15/2024   HCT 49.4 06/15/2024   MCV 90.0 06/15/2024   PLT 263 06/15/2024   Lab Results  Component Value Date   FERRITIN 23 (L) 04/06/2024   IRON 39 (L) 04/06/2024   TIBC 424 04/06/2024   UIBC 385  04/06/2024   IRONPCTSAT 9 (L) 04/06/2024   Lab Results  Component Value Date   RETICCTPCT 1.9 06/15/2024   RBC 5.49 06/15/2024   RBC 5.54 06/15/2024   RETICCTABS 77.3 06/20/2015   No results found for: KPAFRELGTCHN, LAMBDASER, KAPLAMBRATIO No results found for: KIMBERLY LE, IGMSERUM No results found for: STEPHANY CARLOTA BENSON MARKEL EARLA JOANNIE DOC VICK, SPEI   Chemistry      Component Value Date/Time   NA 139 06/15/2024 0927   NA 142 07/18/2017 1421   NA 134 (L) 08/09/2016 1313   K 5.0 06/15/2024 0927   K 4.0 07/18/2017 1421   K 4.2 08/09/2016 1313   CL 106 06/15/2024 0927   CL 103 07/18/2017 1421   CO2 23 06/15/2024 0927   CO2 29 07/18/2017 1421   CO2 20 (L) 08/09/2016 1313   BUN 22 06/15/2024 0927   BUN 19 07/18/2017 1421   BUN 19.9 08/09/2016 1313   CREATININE 1.21 06/15/2024 0927   CREATININE 1.4 (H) 07/18/2017 1421   CREATININE 1.3 08/09/2016 1313      Component Value Date/Time   CALCIUM 10.7 (H) 06/15/2024 0927   CALCIUM 10.2 07/18/2017 1421   CALCIUM 10.0 08/09/2016 1313   ALKPHOS 77 06/15/2024 0927   ALKPHOS 65 07/18/2017 1421   ALKPHOS 75 08/09/2016 1313   AST 12 (L) 06/15/2024 0927   AST 14 08/09/2016 1313   ALT 7 06/15/2024 0927   ALT 21 07/18/2017 1421   ALT 23 08/09/2016 1313   BILITOT 0.4 06/15/2024 0927   BILITOT 0.34 08/09/2016 1313      Impression and Plan: Mr. Eastham is a 83 year old male. He has secondary polycythemia.  I really do not think that he needs to be phlebotomized today.  I am very happy about this.  He had the  SBRT for the right upper lobe nodule.  He was does well with this.  Probably would not do another scan on him for about 3 months or so..  We will go ahead and do a phlebotomy on him today.  I would like to get his hematocrit down little bit more.  Will plan to get him back in a couple of months.   Maude JONELLE Crease, MD 12/1/202510:44 AM

## 2024-08-10 ENCOUNTER — Encounter: Payer: Self-pay | Admitting: Hematology & Oncology

## 2024-08-17 ENCOUNTER — Inpatient Hospital Stay: Admitting: Hematology & Oncology

## 2024-08-17 ENCOUNTER — Inpatient Hospital Stay

## 2024-09-01 ENCOUNTER — Inpatient Hospital Stay

## 2024-09-01 ENCOUNTER — Inpatient Hospital Stay: Admitting: Hematology & Oncology
